# Patient Record
Sex: Female | Born: 1985 | Race: Black or African American | Hispanic: No | Marital: Married | Smoking: Never smoker
Health system: Southern US, Community
[De-identification: ages and names within clinical notes are randomized; demographics above are authoritative.]

## PROBLEM LIST (undated history)

## (undated) ENCOUNTER — Inpatient Hospital Stay (HOSPITAL_COMMUNITY): Payer: Self-pay

## (undated) DIAGNOSIS — I10 Essential (primary) hypertension: Secondary | ICD-10-CM

## (undated) DIAGNOSIS — A749 Chlamydial infection, unspecified: Secondary | ICD-10-CM

## (undated) DIAGNOSIS — J45909 Unspecified asthma, uncomplicated: Secondary | ICD-10-CM

## (undated) DIAGNOSIS — Z9109 Other allergy status, other than to drugs and biological substances: Secondary | ICD-10-CM

## (undated) HISTORY — DX: Unspecified asthma, uncomplicated: J45.909

## (undated) HISTORY — PX: NO PAST SURGERIES: SHX2092

---
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
## 2005-02-01 ENCOUNTER — Inpatient Hospital Stay (HOSPITAL_COMMUNITY): Admission: AD | Admit: 2005-02-01 | Discharge: 2005-02-01 | Payer: Self-pay | Admitting: Obstetrics

## 2005-03-19 ENCOUNTER — Ambulatory Visit: Payer: Self-pay | Admitting: Internal Medicine

## 2005-11-06 ENCOUNTER — Emergency Department (HOSPITAL_COMMUNITY): Admission: EM | Admit: 2005-11-06 | Discharge: 2005-11-06 | Payer: Self-pay | Admitting: Emergency Medicine

## 2006-08-05 ENCOUNTER — Emergency Department (HOSPITAL_COMMUNITY): Admission: EM | Admit: 2006-08-05 | Discharge: 2006-08-05 | Payer: Self-pay | Admitting: Emergency Medicine

## 2006-09-28 ENCOUNTER — Emergency Department (HOSPITAL_COMMUNITY): Admission: EM | Admit: 2006-09-28 | Discharge: 2006-09-28 | Payer: Self-pay | Admitting: Family Medicine

## 2006-11-15 ENCOUNTER — Inpatient Hospital Stay (HOSPITAL_COMMUNITY): Admission: AD | Admit: 2006-11-15 | Discharge: 2006-11-15 | Payer: Self-pay | Admitting: Obstetrics

## 2006-12-17 ENCOUNTER — Inpatient Hospital Stay (HOSPITAL_COMMUNITY): Admission: AD | Admit: 2006-12-17 | Discharge: 2006-12-17 | Payer: Self-pay | Admitting: Obstetrics

## 2006-12-25 ENCOUNTER — Ambulatory Visit (HOSPITAL_COMMUNITY): Admission: RE | Admit: 2006-12-25 | Discharge: 2006-12-25 | Payer: Self-pay | Admitting: Obstetrics

## 2007-01-01 ENCOUNTER — Ambulatory Visit (HOSPITAL_COMMUNITY): Admission: RE | Admit: 2007-01-01 | Discharge: 2007-01-01 | Payer: Self-pay | Admitting: Obstetrics

## 2007-03-12 ENCOUNTER — Inpatient Hospital Stay (HOSPITAL_COMMUNITY): Admission: AD | Admit: 2007-03-12 | Discharge: 2007-03-12 | Payer: Self-pay | Admitting: Obstetrics

## 2007-04-27 ENCOUNTER — Inpatient Hospital Stay (HOSPITAL_COMMUNITY): Admission: AD | Admit: 2007-04-27 | Discharge: 2007-04-28 | Payer: Self-pay | Admitting: Obstetrics & Gynecology

## 2007-05-09 ENCOUNTER — Inpatient Hospital Stay (HOSPITAL_COMMUNITY): Admission: AD | Admit: 2007-05-09 | Discharge: 2007-05-09 | Payer: Self-pay | Admitting: Obstetrics

## 2007-05-16 ENCOUNTER — Inpatient Hospital Stay (HOSPITAL_COMMUNITY): Admission: AD | Admit: 2007-05-16 | Discharge: 2007-05-19 | Payer: Self-pay | Admitting: Obstetrics

## 2007-08-23 ENCOUNTER — Emergency Department (HOSPITAL_COMMUNITY): Admission: EM | Admit: 2007-08-23 | Discharge: 2007-08-23 | Payer: Self-pay | Admitting: Family Medicine

## 2007-09-26 ENCOUNTER — Emergency Department (HOSPITAL_COMMUNITY): Admission: EM | Admit: 2007-09-26 | Discharge: 2007-09-26 | Payer: Self-pay | Admitting: Family Medicine

## 2008-07-17 ENCOUNTER — Emergency Department (HOSPITAL_COMMUNITY): Admission: EM | Admit: 2008-07-17 | Discharge: 2008-07-17 | Payer: Self-pay | Admitting: Family Medicine

## 2008-12-08 ENCOUNTER — Emergency Department (HOSPITAL_COMMUNITY): Admission: EM | Admit: 2008-12-08 | Discharge: 2008-12-08 | Payer: Self-pay | Admitting: Emergency Medicine

## 2009-01-24 ENCOUNTER — Emergency Department (HOSPITAL_COMMUNITY): Admission: EM | Admit: 2009-01-24 | Discharge: 2009-01-24 | Payer: Self-pay | Admitting: Family Medicine

## 2009-12-26 ENCOUNTER — Emergency Department (HOSPITAL_COMMUNITY): Admission: EM | Admit: 2009-12-26 | Discharge: 2009-12-26 | Payer: Self-pay | Admitting: Family Medicine

## 2010-02-27 ENCOUNTER — Emergency Department (HOSPITAL_COMMUNITY): Admission: EM | Admit: 2010-02-27 | Discharge: 2010-02-27 | Payer: Self-pay | Admitting: Family Medicine

## 2010-03-27 ENCOUNTER — Emergency Department (HOSPITAL_COMMUNITY): Admission: EM | Admit: 2010-03-27 | Discharge: 2010-03-27 | Payer: Self-pay | Admitting: Family Medicine

## 2010-07-03 ENCOUNTER — Emergency Department (HOSPITAL_COMMUNITY): Admission: EM | Admit: 2010-07-03 | Discharge: 2010-07-03 | Payer: Self-pay | Admitting: Family Medicine

## 2010-07-03 ENCOUNTER — Emergency Department (HOSPITAL_COMMUNITY): Admission: EM | Admit: 2010-07-03 | Discharge: 2010-07-04 | Payer: Self-pay | Admitting: Emergency Medicine

## 2010-11-05 ENCOUNTER — Emergency Department (HOSPITAL_COMMUNITY)
Admission: EM | Admit: 2010-11-05 | Discharge: 2010-11-05 | Payer: Self-pay | Source: Home / Self Care | Admitting: Emergency Medicine

## 2011-01-13 LAB — POCT PREGNANCY, URINE
Preg Test, Ur: NEGATIVE
Preg Test, Ur: NEGATIVE

## 2011-01-20 LAB — POCT RAPID STREP A (OFFICE): Streptococcus, Group A Screen (Direct): NEGATIVE

## 2011-01-22 LAB — POCT RAPID STREP A (OFFICE): Streptococcus, Group A Screen (Direct): POSITIVE — AB

## 2011-02-03 ENCOUNTER — Emergency Department (HOSPITAL_COMMUNITY)
Admission: EM | Admit: 2011-02-03 | Discharge: 2011-02-04 | Discharge status: Against Medical Advice | Payer: Self-pay | Attending: Emergency Medicine | Admitting: Emergency Medicine

## 2011-02-03 DIAGNOSIS — M549 Dorsalgia, unspecified: Secondary | ICD-10-CM | POA: Insufficient documentation

## 2011-02-03 DIAGNOSIS — M79609 Pain in unspecified limb: Secondary | ICD-10-CM | POA: Insufficient documentation

## 2011-02-03 DIAGNOSIS — R079 Chest pain, unspecified: Secondary | ICD-10-CM | POA: Insufficient documentation

## 2011-02-13 LAB — POCT URINALYSIS DIP (DEVICE)
Bilirubin Urine: NEGATIVE
Glucose, UA: NEGATIVE mg/dL
Ketones, ur: NEGATIVE mg/dL
Nitrite: POSITIVE — AB
Protein, ur: >=300 mg/dL — AB
Specific Gravity, Urine: 1.025 (ref 1.005–1.030)
Urobilinogen, UA: 2 mg/dL — ABNORMAL HIGH (ref 0.0–1.0)
pH: 6.5 (ref 5.0–8.0)

## 2011-02-13 LAB — POCT PREGNANCY, URINE: Preg Test, Ur: NEGATIVE

## 2011-02-16 ENCOUNTER — Emergency Department (HOSPITAL_COMMUNITY)
Admission: EM | Admit: 2011-02-16 | Discharge: 2011-02-16 | Disposition: A | Discharge status: Home / Self Care | Payer: Self-pay | Attending: Emergency Medicine | Admitting: Emergency Medicine

## 2011-02-16 ENCOUNTER — Emergency Department (HOSPITAL_COMMUNITY): Payer: Self-pay

## 2011-02-16 DIAGNOSIS — R109 Unspecified abdominal pain: Secondary | ICD-10-CM | POA: Insufficient documentation

## 2011-02-16 DIAGNOSIS — R10811 Right upper quadrant abdominal tenderness: Secondary | ICD-10-CM | POA: Insufficient documentation

## 2011-02-16 LAB — LIPASE, BLOOD: Lipase: 24 U/L (ref 11–59)

## 2011-02-16 LAB — URINALYSIS, ROUTINE W REFLEX MICROSCOPIC
Bilirubin Urine: NEGATIVE
Glucose, UA: NEGATIVE mg/dL
Hgb urine dipstick: NEGATIVE
Ketones, ur: NEGATIVE mg/dL
Nitrite: NEGATIVE
Protein, ur: NEGATIVE mg/dL
Specific Gravity, Urine: 1.026 (ref 1.005–1.030)
Urobilinogen, UA: 1 mg/dL (ref 0.0–1.0)
pH: 7 (ref 5.0–8.0)

## 2011-02-16 LAB — COMPREHENSIVE METABOLIC PANEL
ALT: 24 U/L (ref 0–35)
AST: 20 U/L (ref 0–37)
Albumin: 3.3 g/dL — ABNORMAL LOW (ref 3.5–5.2)
Alkaline Phosphatase: 49 U/L (ref 39–117)
BUN: 7 mg/dL (ref 6–23)
CO2: 28 mEq/L (ref 19–32)
Calcium: 9.1 mg/dL (ref 8.4–10.5)
Chloride: 108 mEq/L (ref 96–112)
Creatinine, Ser: 0.92 mg/dL (ref 0.4–1.2)
GFR calc Af Amer: >60 mL/min (ref >60)
GFR calc non Af Amer: >60 mL/min (ref >60)
Glucose, Bld: 76 mg/dL (ref 70–99)
Potassium: 3.9 mEq/L (ref 3.5–5.1)
Sodium: 140 mEq/L (ref 135–145)
Total Bilirubin: 0.3 mg/dL (ref 0.3–1.2)
Total Protein: 6.4 g/dL (ref 6.0–8.3)

## 2011-02-16 LAB — CBC
HCT: 40.1 % (ref 36.0–46.0)
Hemoglobin: 13.4 g/dL (ref 12.0–15.0)
MCH: 29.2 pg (ref 26.0–34.0)
MCHC: 33.4 g/dL (ref 30.0–36.0)
MCV: 87.4 fL (ref 78.0–100.0)
Platelets: 297 10*3/uL (ref 150–400)
RBC: 4.59 MIL/uL (ref 3.87–5.11)
RDW: 12.7 % (ref 11.5–15.5)
WBC: 9.5 10*3/uL (ref 4.0–10.5)

## 2011-02-16 LAB — DIFFERENTIAL
Basophils Absolute: 0.1 10*3/uL (ref 0.0–0.1)
Basophils Relative: 1 % (ref 0–1)
Eosinophils Absolute: 0.4 10*3/uL (ref 0.0–0.7)
Eosinophils Relative: 5 % (ref 0–5)
Lymphocytes Relative: 40 % (ref 12–46)
Lymphs Abs: 3.8 10*3/uL (ref 0.7–4.0)
Monocytes Absolute: 1.2 10*3/uL — ABNORMAL HIGH (ref 0.1–1.0)
Monocytes Relative: 13 % — ABNORMAL HIGH (ref 3–12)
Neutro Abs: 4 10*3/uL (ref 1.7–7.7)
Neutrophils Relative %: 43 % (ref 43–77)

## 2011-02-16 LAB — POCT PREGNANCY, URINE: Preg Test, Ur: NEGATIVE

## 2011-02-18 ENCOUNTER — Emergency Department (HOSPITAL_COMMUNITY)
Admission: EM | Admit: 2011-02-18 | Discharge: 2011-02-18 | Discharge status: Against Medical Advice | Payer: Self-pay | Attending: Emergency Medicine | Admitting: Emergency Medicine

## 2011-02-18 DIAGNOSIS — R109 Unspecified abdominal pain: Secondary | ICD-10-CM | POA: Insufficient documentation

## 2011-03-21 NOTE — Discharge Summary (Signed)
Amanda Melton, FAIT                             ACCOUNT NO.:  [id number]   MEDICAL RECORD NO.:  [id number]                   PATIENT TYPE:  INP   LOCATION:  9304                                 FACILITY:  [facility]   PHYSICIAN:  Roseanna Rainbow, M.D.         DATE OF BIRTH:  June 28, 1986   DATE OF ADMISSION:  06/21/2004  DATE OF DISCHARGE:  06/25/2004                                 DISCHARGE SUMMARY   CHIEF COMPLAINT:  The patient is an 25 year old gravida 0 African-American  female, last menstrual period in 2003 on Depo-Provera, who presented  complaining of pelvic and lower back pain.   HISTORY OF PRESENT ILLNESS:  See above.  She had presented to the office  with these complaints.  GC and chlamydia probes were positive.  She had  failed an outpatient regimen for pelvic inflammatory disease including  doxycycline and Flagyl.   PAST SURGICAL HISTORY:  She denies.   PAST MEDICAL HISTORY:  She denies.   MEDICATIONS:  Doxycycline and Flagyl.   ALLERGIES:  No known drug allergies.   SOCIAL HISTORY:  No tobacco, ethanol, or substance abuse.   PHYSICAL EXAMINATION:  VITAL SIGNS:  Temperature 98.2, pulse 64, respiratory  rate 18, blood pressure 117/67.  LUNGS:  Clear to auscultation bilaterally.  HEART:  Regular rate and rhythm.  ABDOMEN:  Nongravid, tender.  PELVIC:  The uterus mid position, tender.  Adnexa tender bilaterally.   ASSESSMENT:  Pelvic inflammatory disease, failed outpatient regimen.   PLAN:  Admission, parenteral antibiotics.   HOSPITAL COURSE:  The patient was admitted and was started on Cefotan and  doxycycline.  She remained afebrile throughout her hospital course.  A  pelvic ultrasound failed to demonstrate any pelvic collections or masses.  Her symptoms improved.  On hospital day #5 her exam was nontender and she  was discharged to home.   DISCHARGE DIAGNOSIS:  Pelvic inflammatory disease.   CONDITION:  Stable.   DIET:  Regular.   MEDICATIONS:   Doxycycline, Flagyl, ibuprofen.   ACTIVITY:  No intercourse.   DISPOSITION:  The patient was to follow up in the office in 2 weeks.      LAJ/MEDQ  D:  07/23/2004  T:  07/23/2004  Job:  [id number]

## 2011-08-06 LAB — POCT RAPID STREP A: Streptococcus, Group A Screen (Direct): NEGATIVE

## 2011-08-12 LAB — POCT RAPID STREP A: Streptococcus, Group A Screen (Direct): NEGATIVE

## 2011-08-18 LAB — CBC
HCT: 30.6 — ABNORMAL LOW
Hemoglobin: 10.2 — ABNORMAL LOW
MCHC: 33.5
MCV: 87.7
RDW: 13.3

## 2011-08-19 LAB — CBC
MCHC: 33.7
MCV: 87.5
Platelets: 242
RDW: 12.9
WBC: 9.9

## 2011-08-19 LAB — COMPREHENSIVE METABOLIC PANEL
AST: 17
Albumin: 2.1 — ABNORMAL LOW
Chloride: 107
Creatinine, Ser: 0.52
GFR calc Af Amer: >60
Potassium: 3.2 — ABNORMAL LOW
Total Bilirubin: 0.7

## 2011-08-19 LAB — SAMPLE TO BLOOD BANK

## 2011-08-19 LAB — RPR: RPR Ser Ql: NONREACTIVE

## 2011-08-19 LAB — URIC ACID: Uric Acid, Serum: 3.3

## 2011-09-29 ENCOUNTER — Encounter: Payer: Self-pay | Admitting: *Deleted

## 2011-09-29 ENCOUNTER — Emergency Department (INDEPENDENT_AMBULATORY_CARE_PROVIDER_SITE_OTHER): Payer: Self-pay

## 2011-09-29 ENCOUNTER — Emergency Department (INDEPENDENT_AMBULATORY_CARE_PROVIDER_SITE_OTHER)
Admission: EM | Admit: 2011-09-29 | Discharge: 2011-09-29 | Disposition: A | Discharge status: Home / Self Care | Payer: Self-pay | Source: Home / Self Care | Attending: Emergency Medicine | Admitting: Emergency Medicine

## 2011-09-29 DIAGNOSIS — S239XXA Sprain of unspecified parts of thorax, initial encounter: Secondary | ICD-10-CM

## 2011-09-29 DIAGNOSIS — IMO0002 Reserved for concepts with insufficient information to code with codable children: Secondary | ICD-10-CM

## 2011-09-29 LAB — POCT PREGNANCY, URINE: Preg Test, Ur: NEGATIVE

## 2011-09-29 LAB — POCT URINALYSIS DIP (DEVICE)
Bilirubin Urine: NEGATIVE
Glucose, UA: NEGATIVE mg/dL
Hgb urine dipstick: NEGATIVE
Leukocytes, UA: NEGATIVE
Nitrite: NEGATIVE

## 2011-09-29 MED ORDER — HYDROCODONE-ACETAMINOPHEN 5-325 MG PO TABS: ORAL_TABLET | ORAL | Status: AC

## 2011-09-29 MED ORDER — CYCLOBENZAPRINE HCL 5 MG PO TABS
5.0000 mg | ORAL_TABLET | Freq: Three times a day (TID) | ORAL | Status: AC | PRN
Start: 2011-09-29 — End: 2011-10-09

## 2011-09-29 MED ORDER — DICLOFENAC SODIUM 75 MG PO TBEC: 75.0000 mg | DELAYED_RELEASE_TABLET | Freq: Two times a day (BID) | ORAL | Status: DC

## 2011-09-29 NOTE — ED Provider Notes (Signed)
History     CSN: [id number] Arrival date & time: 09/29/2011  4:58 PM   First MD Initiated Contact with Patient 09/29/11 1550      Chief Complaint  Patient presents with  . Back Pain    (Consider location/radiation/quality/duration/timing/severity/associated sxs/prior treatment) HPI Comments: Amanda Melton has had a three-day history of midline upper back pain, localized between the shoulder blades. She denies any injury and she was just sitting and watching television the pain came on. The pain does not radiate up or down or into her arms or legs and she has no numbness or tingling in the arms or legs. The pain is rated as a 10 over 10 in intensity. It is constant and is not related to lifting, twisting, bending, position, or deep inspiration. He she denies any anterior chest pain or shortness of breath. She's had no abdominal pain, nausea, or vomiting. No fever, chills, or urinary symptoms. She tried Aleve, aspirin, ibuprofen, and Tylenol all without relief.   History reviewed. No pertinent past medical history.  History reviewed. No pertinent past surgical history.  History reviewed. No pertinent family history.  History  Substance Use Topics  . Smoking status: Never Smoker   . Smokeless tobacco: Not on file  . Alcohol Use: Yes     occasionally    OB History    Grav Para Term Preterm Abortions TAB SAB Ect Mult Living                  Review of Systems  Constitutional: Negative for fever, chills and unexpected weight change.  Gastrointestinal: Negative for abdominal pain.  Genitourinary: Negative for dysuria, urgency, frequency and difficulty urinating.  Musculoskeletal: Positive for back pain. Negative for myalgias, joint swelling, arthralgias and gait problem.  Neurological: Negative for weakness and numbness.    Allergies  Review of patient's allergies indicates no known allergies.  Home Medications   Current Outpatient Rx  Name Route Sig Dispense Refill  .  MEDROXYPROGESTERONE ACETATE 150 MG/ML IM SUSP Intramuscular Inject 150 mg into the muscle every 3 (three) months.      . CYCLOBENZAPRINE HCL 5 MG PO TABS Oral Take 1 tablet (5 mg total) by mouth 3 (three) times daily as needed for muscle spasms. 30 tablet 0  . DICLOFENAC SODIUM 75 MG PO TBEC Oral Take 1 tablet (75 mg total) by mouth 2 (two) times daily. 20 tablet 0  . HYDROCODONE-ACETAMINOPHEN 5-325 MG PO TABS  1 to 2 tabs every 4 to 6 hours as needed for pain. 20 tablet 0    BP 144/77  Pulse 64  Temp(Src) 98.8 F (37.1 C) (Oral)  Resp 16  SpO2 100%  LMP 12/17/2010  Physical Exam  Nursing note and vitals reviewed. Constitutional: She is oriented to person, place, and time. She appears well-developed and well-nourished.  Cardiovascular: Normal rate, regular rhythm and normal heart sounds.   Pulmonary/Chest: Effort normal and breath sounds normal.  Abdominal: Soft. Bowel sounds are normal. She exhibits no distension, no abdominal bruit, no pulsatile midline mass and no mass. There is no tenderness. There is no rebound and no guarding.  Musculoskeletal: She exhibits tenderness. She exhibits no edema.       Lumbar back: She exhibits decreased range of motion, tenderness, bony tenderness and pain. She exhibits no swelling, no edema, no deformity, no spasm and normal pulse.       Exam of the upper back reveals tenderness to palpation between the shoulder blades extending out just below the  shoulder blade areas bilaterally. The back has very limited range of motion with pain in all directions. Straight leg raising was negative. Her neck had a full range of motion with no pain and there was no muscular neck pain to palpation and shoulders have a full range of motion.  Neurological: She is alert and oriented to person, place, and time. She has normal reflexes. She displays no atrophy and normal reflexes. No sensory deficit. She exhibits normal muscle tone. Coordination and gait normal.  Skin: Skin is  warm and dry. No rash noted. She is not diaphoretic.    ED Course  Procedures (including critical care time)  Results for orders placed during the hospital encounter of 09/29/11  POCT URINALYSIS DIP (DEVICE)      Component Value Range   Glucose, UA NEGATIVE  NEGATIVE (mg/dL)   Bilirubin Urine NEGATIVE  NEGATIVE    Ketones, ur NEGATIVE  NEGATIVE (mg/dL)   Specific Gravity, Urine 1.020  1.005 - 1.030    Hgb urine dipstick NEGATIVE  NEGATIVE    pH 7.0  5.0 - 8.0    Protein, ur NEGATIVE  NEGATIVE (mg/dL)   Urobilinogen, UA 0.2  0.0 - 1.0 (mg/dL)   Nitrite NEGATIVE  NEGATIVE    Leukocytes, UA NEGATIVE  NEGATIVE   POCT PREGNANCY, URINE      Component Value Range   Preg Test, Ur NEGATIVE         Labs Reviewed  POCT URINALYSIS DIP (DEVICE)  POCT PREGNANCY, URINE  POCT URINALYSIS DIPSTICK  POCT PREGNANCY, URINE   Dg Chest 2 View  09/29/2011  *RADIOLOGY REPORT*  Clinical Data:  Back pain.  CHEST - 2 VIEW  Comparison: None  Findings: The heart size and mediastinal contours are within normal limits.  Both lungs are clear.  The visualized skeletal structures are unremarkable.  IMPRESSION: No active disease.  Original Report Authenticated By: Reola Calkins, M.D.     1. Thoracic sprain and strain       MDM  Her chest x-ray and urinalysis were normal. Apparently she has muscular upper back pain and she was begun on back exercises, anti-inflammatories, muscle relaxers, and pain medication. Suggest she return in 2 weeks if no improvement.        Roque Lias, MD 09/29/11 [phone]

## 2011-09-29 NOTE — ED Notes (Signed)
Pt is here with complaints of middle back pain with onset Saturday.  Pt denies any known injury or history of back pain.  Reports pain across back in bra strap area as aching with increased pain with movement and lying flat.

## 2011-11-04 NOTE — L&D Delivery Note (Signed)
Delivery Note At 12:04 PM a viable female was delivered via  (Presentation: LOP ).  APGAR: , ; weight 6 lb 3.2 oz (2812 g).   Placenta status: intact .  Cord:  with the following complications:none .  Cord pH: sent.  Atony with response to fundal massage. Hemabate x 2  Anesthesia: Epidural  Episiotomy: none Lacerations: labial Suture Repair: 3.0 vicryl rapide Est. Blood Loss (mL): 1000 ml  Mom to postpartum.  Baby to nursery-stable.  JACKSON-MOORE,Jerin Franzel A 07/23/2012, 12:53 PM

## 2011-12-22 ENCOUNTER — Inpatient Hospital Stay (HOSPITAL_COMMUNITY)
Admission: AD | Admit: 2011-12-22 | Discharge: 2011-12-22 | Disposition: A | Discharge status: Home / Self Care | Payer: Medicaid Other | Source: Ambulatory Visit | Attending: Family Medicine | Admitting: Family Medicine

## 2011-12-22 ENCOUNTER — Encounter (HOSPITAL_COMMUNITY): Payer: Self-pay | Admitting: *Deleted

## 2011-12-22 DIAGNOSIS — O210 Mild hyperemesis gravidarum: Secondary | ICD-10-CM | POA: Insufficient documentation

## 2011-12-22 DIAGNOSIS — O219 Vomiting of pregnancy, unspecified: Secondary | ICD-10-CM

## 2011-12-22 LAB — URINALYSIS, ROUTINE W REFLEX MICROSCOPIC
Glucose, UA: NEGATIVE mg/dL
Leukocytes, UA: NEGATIVE
Nitrite: NEGATIVE
Protein, ur: NEGATIVE mg/dL
Urobilinogen, UA: 0.2 mg/dL (ref 0.0–1.0)

## 2011-12-22 LAB — POCT PREGNANCY, URINE: Preg Test, Ur: POSITIVE — AB

## 2011-12-22 MED ORDER — ONDANSETRON HCL 8 MG PO TABS: 8.0000 mg | ORAL_TABLET | Freq: Three times a day (TID) | ORAL | Status: AC | PRN

## 2011-12-22 MED ORDER — ONDANSETRON 8 MG PO TBDP
8.0000 mg | ORAL_TABLET | Freq: Once | ORAL | Status: AC
  Administered 2011-12-22: 8 mg via ORAL
  Filled 2011-12-22: qty 1

## 2011-12-22 MED ORDER — PROMETHAZINE HCL 25 MG PO TABS
25.0000 mg | ORAL_TABLET | Freq: Once | ORAL | Status: AC
  Administered 2011-12-22: 25 mg via ORAL
  Filled 2011-12-22: qty 1

## 2011-12-22 MED ORDER — PROMETHAZINE HCL 12.5 MG PO TABS: 25.0000 mg | ORAL_TABLET | Freq: Four times a day (QID) | ORAL | Status: DC | PRN

## 2011-12-22 NOTE — Progress Notes (Signed)
Can't keep anything on stomach, every time she eats it come right back.  Found out preg 2-3wks ago, unsure of how far along she is.

## 2011-12-22 NOTE — ED Provider Notes (Signed)
History   Amanda Melton is a 26 y.o. year old G63P1011 female at Unknown weeks gestation who presents to MAU reporting N/V, being unable to keep anything down x 2 days and pos UPT 2-3 weeks ago.   CSN: [id number]  Arrival date & time 12/22/11  0934   None     Chief Complaint  Patient presents with  . Morning Sickness    (Consider location/radiation/quality/duration/timing/severity/associated sxs/prior treatment) HPI  Past Medical History  Diagnosis Date  . No pertinent past medical history     Past Surgical History  Procedure Date  . No past surgeries     Family History  Problem Relation Age of Onset  . Diabetes Father   . Hypertension Father     History  Substance Use Topics  . Smoking status: Never Smoker   . Smokeless tobacco: Not on file  . Alcohol Use: Yes     occasionally, not with preg    OB History    Grav Para Term Preterm Abortions TAB SAB Ect Mult Living   3 1 1  0 1 0 1 0 0 1      Review of Systems  Constitutional: Negative for fever and chills.  Gastrointestinal: Positive for nausea and vomiting. Negative for abdominal pain.  Genitourinary: Negative for dysuria and vaginal bleeding.    Allergies  Review of patient's allergies indicates no known allergies.  Home Medications  No current outpatient prescriptions on file.  BP 128/66  Pulse 74  Temp(Src) 98.1 F (36.7 C) (Oral)  Resp 20  Ht 5' 5.5" (1.664 m)  Wt 110.224 kg (243 lb)  BMI 39.82 kg/m2  LMP 11/06/2011  Physical Exam  Constitutional: She is oriented to person, place, and time. She appears well-developed and well-nourished. No distress.  HENT:  Mouth/Throat: Mucous membranes are not dry.  Cardiovascular: Normal rate.   Pulmonary/Chest: Effort normal.  Abdominal: Soft. There is no tenderness.  Genitourinary: Vagina normal and uterus normal.  Neurological: She is alert and oriented to person, place, and time.  Skin: Skin is warm and dry.  Psychiatric: She has a normal mood  and affect.    ED Course  Procedures (including critical care time)  Labs Reviewed  POCT PREGNANCY, URINE - Abnormal; Notable for the following:    Preg Test, Ur POSITIVE (*)    All other components within normal limits  URINALYSIS, ROUTINE W REFLEX MICROSCOPIC  LAB REPORT - SCANNED   tolerating solids after Zofran and Phenergan PO  1. Vomiting or nausea of pregnancy without dehydration   MDM  D/C home Rx Phenergan and Zofran Start [facility] Pregnancy verification letter given  Dorathy Kinsman 12/22/2011

## 2011-12-22 NOTE — Progress Notes (Signed)
Pt in c/o nausea and vomiting since Friday night.  Unable to keep anything down.  Reports abdominal pain after vomiting.  Denies any bleeding. LMP 11/06/11.

## 2011-12-22 NOTE — Discharge Instructions (Signed)
Prenatal Care Providers °[facility]    [facility] ° Phone- [phone]     Phone: [phone] °         °[facility]                      [facility] ° @[place]     Phone: [phone] ° Phone: [phone] °        [facility] °[facility]     Phone: [phone] ° Phone: [phone]   °        [facility] °[facility]                 hone: [phone] ° Phone: [phone] °        [facility] °Dr. Bernard Marshall      Phone: [phone] ° Phone: [phone] °        [facility] °[facility]                Phone: [phone] ° [facility]  ° Phone:[phone]    [facility] ([place]) °         Phone: [phone] °[facility] °  Phone: [phone] °

## 2012-01-01 NOTE — ED Provider Notes (Signed)
Chart reviewed and agree with management and plan.  

## 2012-01-27 LAB — OB RESULTS CONSOLE ABO/RH: RH Type: POSITIVE

## 2012-01-27 LAB — OB RESULTS CONSOLE ANTIBODY SCREEN: Antibody Screen: NEGATIVE

## 2012-01-27 LAB — OB RESULTS CONSOLE HEPATITIS B SURFACE ANTIGEN: Hepatitis B Surface Ag: NEGATIVE

## 2012-01-31 ENCOUNTER — Inpatient Hospital Stay (HOSPITAL_COMMUNITY)
Admission: AD | Admit: 2012-01-31 | Discharge: 2012-02-01 | Disposition: A | Discharge status: Home / Self Care | Payer: Medicaid Other | Source: Ambulatory Visit | Attending: Obstetrics | Admitting: Obstetrics

## 2012-01-31 DIAGNOSIS — O210 Mild hyperemesis gravidarum: Secondary | ICD-10-CM | POA: Insufficient documentation

## 2012-01-31 DIAGNOSIS — K5289 Other specified noninfective gastroenteritis and colitis: Secondary | ICD-10-CM | POA: Insufficient documentation

## 2012-01-31 DIAGNOSIS — R197 Diarrhea, unspecified: Secondary | ICD-10-CM | POA: Insufficient documentation

## 2012-01-31 DIAGNOSIS — O99891 Other specified diseases and conditions complicating pregnancy: Secondary | ICD-10-CM | POA: Insufficient documentation

## 2012-01-31 DIAGNOSIS — K529 Noninfective gastroenteritis and colitis, unspecified: Secondary | ICD-10-CM

## 2012-01-31 LAB — URINALYSIS, ROUTINE W REFLEX MICROSCOPIC
Ketones, ur: NEGATIVE mg/dL
Nitrite: NEGATIVE
Protein, ur: 30 mg/dL — AB
Urobilinogen, UA: 2 mg/dL — ABNORMAL HIGH (ref 0.0–1.0)

## 2012-01-31 NOTE — MAU Note (Signed)
Pt states," I woke up this morning at 5am an threw up and then diarrhea started. I've thrown up five times and had diarrhea numerous times.I was cold and my temp was 101.5 at 9:30 pm . I haven't been able to eat all day. I have sharp pains in my low abdomen."

## 2012-02-01 ENCOUNTER — Encounter (HOSPITAL_COMMUNITY): Payer: Self-pay | Admitting: *Deleted

## 2012-02-01 LAB — CBC
Hemoglobin: 12.1 g/dL (ref 12.0–15.0)
MCH: 30.3 pg (ref 26.0–34.0)
MCHC: 34.5 g/dL (ref 30.0–36.0)
Platelets: 226 10*3/uL (ref 150–400)
RBC: 4 MIL/uL (ref 3.87–5.11)

## 2012-02-01 LAB — DIFFERENTIAL
Basophils Relative: 0 % (ref 0–1)
Eosinophils Absolute: 0.1 10*3/uL (ref 0.0–0.7)
Lymphs Abs: 0.9 10*3/uL (ref 0.7–4.0)
Neutro Abs: 5.9 10*3/uL (ref 1.7–7.7)
Neutrophils Relative %: 75 % (ref 43–77)

## 2012-02-01 MED ORDER — OXYCODONE-ACETAMINOPHEN 5-325 MG PO TABS
2.0000 | ORAL_TABLET | Freq: Once | ORAL | Status: AC
  Administered 2012-02-01: 2 via ORAL
  Filled 2012-02-01: qty 2

## 2012-02-01 MED ORDER — ONDANSETRON HCL 4 MG/2ML IJ SOLN
4.0000 mg | Freq: Once | INTRAMUSCULAR | Status: AC
  Administered 2012-02-01: 4 mg via INTRAVENOUS
  Filled 2012-02-01: qty 2

## 2012-02-01 MED ORDER — GI COCKTAIL ~~LOC~~
30.0000 mL | Freq: Once | ORAL | Status: AC
  Administered 2012-02-01: 30 mL via ORAL
  Filled 2012-02-01: qty 30

## 2012-02-01 MED ORDER — LACTATED RINGERS IV BOLUS (SEPSIS)
1000.0000 mL | Freq: Once | INTRAVENOUS | Status: AC
  Administered 2012-02-01: 1000 mL via INTRAVENOUS

## 2012-02-01 MED ORDER — PROMETHAZINE HCL 25 MG RE SUPP: 25.0000 mg | Freq: Four times a day (QID) | RECTAL | Status: DC | PRN

## 2012-02-01 NOTE — MAU Provider Note (Signed)
History   Amanda Melton is a 26 y.o. year old G80P1011 female at [redacted]w[redacted]d weeks gestation who presents to MAU reporting nausea, vomiting, diarrhea and fever 100.5 x 1 day. She denies sick contacts. She had N/V of pregnancy earlier in the pregnancy, but denies any x several weeks.  First Provider Initiated Contact with Patient 02/01/12 0157     CSN: [id number]  Arrival date & time 01/31/12  2259   None     Chief Complaint  Patient presents with  . Emesis  . Diarrhea  . Fever    (Consider location/radiation/quality/duration/timing/severity/associated sxs/prior treatment) HPI  Past Medical History  Diagnosis Date  . No pertinent past medical history     Past Surgical History  Procedure Date  . No past surgeries     Family History  Problem Relation Age of Onset  . Diabetes Father   . Hypertension Father   . Anesthesia problems Neg Hx     History  Substance Use Topics  . Smoking status: Never Smoker   . Smokeless tobacco: Not on file  . Alcohol Use: Yes     occasionally, not with preg    OB History    Grav Para Term Preterm Abortions TAB SAB Ect Mult Living   4 1 1  0 1 0 1 0 0 1     Review of Systems  Constitutional: Positive for fever. Negative for chills.  Gastrointestinal: Positive for nausea, vomiting, abdominal pain (mild generalized colicy pain) and diarrhea.  Genitourinary: Negative for dysuria, flank pain, vaginal bleeding and vaginal discharge.  Musculoskeletal: Negative for myalgias.   Allergies  Review of patient's allergies indicates no known allergies.  Home Medications  No current outpatient prescriptions on file.  BP 126/69  Pulse 85  Temp(Src) 98.1 F (36.7 C) (Oral)  Resp 16  LMP 11/06/2011  Physical Exam  Nursing note and vitals reviewed. Constitutional: She is oriented to person, place, and time. She appears well-developed and well-nourished. No distress.  Cardiovascular: Normal rate.   Pulmonary/Chest: Effort normal.  Abdominal:  Soft. There is no tenderness.  Neurological: She is alert and oriented to person, place, and time.  Skin: Skin is warm and dry.  Psychiatric: She has a normal mood and affect.   FHR 159 by doppler ED Course  Procedures (including critical care time)  Labs Reviewed  URINALYSIS, ROUTINE W REFLEX MICROSCOPIC - Abnormal; Notable for the following:    Specific Gravity, Urine >1.030 (*)    Hgb urine dipstick TRACE (*)    Bilirubin Urine SMALL (*)    Protein, ur 30 (*)    Urobilinogen, UA 2.0 (*)    All other components within normal limits  URINE MICROSCOPIC-ADD ON - Abnormal; Notable for the following:    Squamous Epithelial / LPF FEW (*)    All other components within normal limits   N/V resolved w/ Zofran Feeing better after IV fluids, GI cocktail and Percocet. No Diarrhea while in MAU.  1. Gastroenteritis    MDM  D/C home  F/U as scheduled w/ Dr. Clearance Coots or MAU PRN for worsening Sx. BRAT diet.  Encouraged frequent hand washing  Dorathy Kinsman 02/01/2012 5:22 AM

## 2012-02-01 NOTE — MAU Note (Signed)
Pt reports n/v/d since today. Also pt reports abdominal pain.

## 2012-02-01 NOTE — Discharge Instructions (Signed)
May take Imodium for diarrhea as needed.   B.R.A.T. Diet Your doctor has recommended the B.R.A.T. diet for you or your child until the condition improves. This is often used to help control diarrhea and vomiting symptoms. If you or your child can tolerate clear liquids, you may have:  Bananas.   Rice.   Applesauce.   Toast (and other simple starches such as crackers, potatoes, noodles).  Be sure to avoid dairy products, meats, and fatty foods until symptoms are better. Fruit juices such as apple, grape, and prune juice can make diarrhea worse. Avoid these. Continue this diet for 2 days or as instructed by your caregiver. Document Released: 10/20/2005 Document Revised: 10/09/2011 Document Reviewed: 04/08/2007 [facility] Patient Information 2012 [place], [place].

## 2012-02-25 ENCOUNTER — Inpatient Hospital Stay (HOSPITAL_COMMUNITY)
Admission: AD | Admit: 2012-02-25 | Discharge: 2012-02-25 | Disposition: A | Discharge status: Home / Self Care | Payer: Medicaid Other | Source: Ambulatory Visit | Attending: Obstetrics & Gynecology | Admitting: Obstetrics & Gynecology

## 2012-02-25 ENCOUNTER — Encounter (HOSPITAL_COMMUNITY): Payer: Self-pay

## 2012-02-25 DIAGNOSIS — O26899 Other specified pregnancy related conditions, unspecified trimester: Secondary | ICD-10-CM

## 2012-02-25 DIAGNOSIS — R109 Unspecified abdominal pain: Secondary | ICD-10-CM | POA: Insufficient documentation

## 2012-02-25 DIAGNOSIS — O99891 Other specified diseases and conditions complicating pregnancy: Secondary | ICD-10-CM | POA: Insufficient documentation

## 2012-02-25 DIAGNOSIS — IMO0002 Reserved for concepts with insufficient information to code with codable children: Secondary | ICD-10-CM | POA: Insufficient documentation

## 2012-02-25 MED ORDER — ACETAMINOPHEN 500 MG PO TABS
1000.0000 mg | ORAL_TABLET | Freq: Once | ORAL | Status: AC
  Administered 2012-02-25: 1000 mg via ORAL
  Filled 2012-02-25: qty 2

## 2012-02-25 NOTE — MAU Note (Signed)
No adverse effect from tylenol, rates pain 6/10

## 2012-02-25 NOTE — Discharge Instructions (Signed)
Take Tylenol 325 mg 2 tablets by mouth every 4 hours if needed for pain. Drink at least 8 8-oz glasses of water every day. Call your doctor if your pain worsens or if you have any vaginal bleeding. Keep your appointments as scheduled.

## 2012-02-25 NOTE — MAU Provider Note (Signed)
  History     CSN: [id number]  Arrival date and time: 02/25/12 3474   First Provider Initiated Contact with Patient 02/25/12 [phone]      Chief Complaint  Patient presents with  . Abdominal Pain   HPI Amanda Melton 26 y.o. [redacted]w[redacted]d  Comes to MAU as she was at work [employer]) and a can which she was taking off a shelf fell and hit on her abdomen.  Having abdominal pain.  Denies any vaginal bleeding or leaking of fluid.  OB History    Grav Para Term Preterm Abortions TAB SAB Ect Mult Living   3 1 1  0 1 0 1 0 0 1      Past Medical History  Diagnosis Date  . No pertinent past medical history     Past Surgical History  Procedure Date  . No past surgeries     Family History  Problem Relation Age of Onset  . Diabetes Father   . Hypertension Father   . Anesthesia problems Neg Hx     History  Substance Use Topics  . Smoking status: Never Smoker   . Smokeless tobacco: Never Used  . Alcohol Use: No     occasionally, not with preg    Allergies: No Known Allergies  Prescriptions prior to admission  Medication Sig Dispense Refill  . cholecalciferol (VITAMIN D) 1000 UNITS tablet Take 1,000 Units by mouth daily.      . ondansetron (ZOFRAN-ODT) 4 MG disintegrating tablet Take 4 mg by mouth every 8 (eight) hours as needed. For nausea      . Prenatal Vit-Fe Fumarate-FA (PRENATAL MULTIVITAMIN) TABS Take 1 tablet by mouth daily.        Review of Systems  Gastrointestinal: Positive for abdominal pain.  Genitourinary: Negative for dysuria.       No vaginal bleeding   Physical Exam   Blood pressure 129/74, pulse 73, temperature 97.8 F (36.6 C), temperature source Oral, resp. rate 20, height 5\' 5"  (1.651 m), weight 245 lb 3.2 oz (111.222 kg), last menstrual period 11/06/2011, SpO2 100.00%.  Physical Exam  Nursing note and vitals reviewed. Constitutional: She is oriented to person, place, and time. She appears well-developed and well-nourished.  HENT:  Head:  Normocephalic.  Eyes: EOM are normal.  Neck: Neck supple.  GI: Soft. There is no tenderness. There is no rebound and no guarding.       No bruising seen on abdomen.  Skin is intact. States can hit above the umbilicus. Fundus is 2 fingers below the umbilicus. Tender in all areas above and below the umbilicus.  Musculoskeletal: Normal range of motion.  Neurological: She is alert and oriented to person, place, and time.  Skin: Skin is warm and dry.  Psychiatric: She has a normal mood and affect.    MAU Course  Procedures  MDM 1027  Reviewed plan of care with Dr. Tamela Oddi 1115  Feeling better after resting in bed.  Assessment and Plan  Abdominal pain in pregnancy   Plan Continue tylenol as needed Call your doctor if your pain worsens or if you have any vaginal bleeding. Keep your appointments as scheduled.  Amanda Melton 02/25/2012, 10:28 AM

## 2012-02-25 NOTE — MAU Note (Signed)
Patient states she was at work and a heavy can fell onto her upper abdomen. Started having stabbing pain in the upper abdomen about 10 minutes later and having lower abdominal pressure. Patient denies any bleeding or leaking.

## 2012-02-25 NOTE — MAU Note (Signed)
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 Denies bleeding or abnormal vaginal discharge. Last intercourse Saturday. States pain/pressure no better or worse after voiding.

## 2012-03-19 ENCOUNTER — Encounter (HOSPITAL_COMMUNITY): Payer: Self-pay | Admitting: *Deleted

## 2012-03-19 ENCOUNTER — Inpatient Hospital Stay (HOSPITAL_COMMUNITY)
Admission: AD | Admit: 2012-03-19 | Discharge: 2012-03-19 | Disposition: A | Discharge status: Home / Self Care | Payer: Medicaid Other | Source: Ambulatory Visit | Attending: Obstetrics | Admitting: Obstetrics

## 2012-03-19 DIAGNOSIS — R059 Cough, unspecified: Secondary | ICD-10-CM | POA: Insufficient documentation

## 2012-03-19 DIAGNOSIS — O99891 Other specified diseases and conditions complicating pregnancy: Secondary | ICD-10-CM | POA: Insufficient documentation

## 2012-03-19 DIAGNOSIS — J069 Acute upper respiratory infection, unspecified: Secondary | ICD-10-CM | POA: Insufficient documentation

## 2012-03-19 DIAGNOSIS — R05 Cough: Secondary | ICD-10-CM | POA: Insufficient documentation

## 2012-03-19 MED ORDER — AZITHROMYCIN 250 MG PO TABS: ORAL_TABLET | ORAL | Status: AC

## 2012-03-19 NOTE — Discharge Instructions (Signed)

## 2012-03-19 NOTE — MAU Provider Note (Signed)
  History     CSN: [id number]  Arrival date and time: 03/19/12 4098   First Provider Initiated Contact with Patient 03/19/12 0759      Chief Complaint  Patient presents with  . Shortness of Breath   HPIWendy C Melton is 26 y.o. G3P1011 [redacted]w[redacted]d weeks presenting with congestion and cough.  She was seen by Dr. Clearance Coots last week and was instructed to take Robitussin for the cough.  She states it has not helped.  She comes here this am for evaluation.  She denies fever.  States she is having body aching.  Occ constipation but denies nausea, vomiting, and diarrhea.  Nasal congestion with yellow drainage.  Sinus pain.     Past Medical History  Diagnosis Date  . No pertinent past medical history     Past Surgical History  Procedure Date  . No past surgeries     Family History  Problem Relation Age of Onset  . Diabetes Father   . Hypertension Father   . Anesthesia problems Neg Hx     History  Substance Use Topics  . Smoking status: Never Smoker   . Smokeless tobacco: Never Used  . Alcohol Use: No     occasionally, not with preg    Allergies: No Known Allergies  Prescriptions prior to admission  Medication Sig Dispense Refill  . cholecalciferol (VITAMIN D) 1000 UNITS tablet Take 1,000 Units by mouth daily.      . Prenatal Vit-Fe Fumarate-FA (PRENATAL MULTIVITAMIN) TABS Take 1 tablet by mouth daily.        Review of Systems  Constitutional: Negative for fever (+ body aches) and chills.  HENT:       Tenderness over and under her eyes  Respiratory: Positive for cough and shortness of breath. Negative for wheezing.   Gastrointestinal: Positive for constipation (occ). Negative for nausea, vomiting and diarrhea.   Physical Exam   Blood pressure 119/80, pulse 92, temperature 98.4 F (36.9 C), temperature source Oral, resp. rate 22, height 5\' 4"  (1.626 m), weight 109.68 kg (241 lb 12.8 oz), last menstrual period 11/06/2011, SpO2 98.00%.  Physical Exam  Constitutional: She is  oriented to person, place, and time. She appears well-developed and well-nourished. No distress.  HENT:  Head: Normocephalic.  Nose: Mucosal edema and rhinorrhea present. Right sinus exhibits no maxillary sinus tenderness and no frontal sinus tenderness. Left sinus exhibits no maxillary sinus tenderness and no frontal sinus tenderness.  Neck: Normal range of motion.  Cardiovascular: Normal rate.   Respiratory: She has no wheezes.       + dry cough  Neurological: She is alert and oriented to person, place, and time.  Skin: Skin is warm and dry.  Psychiatric: She has a normal mood and affect. Her behavior is normal. Thought content normal.    MAU Course  Procedures  MDM Paged Dr. Clearance Coots at 8:15 left message to return my call.   Paged at 8:32.  No answer will report on patient when he returns call.  Will discharge her.  Assessment and Plan  A:  URI   [redacted]w[redacted]d gestation  P:  Continue Robitussin      Rx for Zithromax     Encouraged to stay well hydrated    Call Dr. Clearance Coots for worsening sxs.  Tekla Malachowski,EVE M 03/19/2012, 8:03 AM

## 2012-03-19 NOTE — MAU Note (Signed)
Pt C/O HA & cold sx's that started Wednesday, started taking Robitussin & Tylenol.  Pt states she started wheezing & having SOB last night.

## 2012-07-23 ENCOUNTER — Inpatient Hospital Stay (HOSPITAL_COMMUNITY): Payer: Medicaid Other | Admitting: Anesthesiology

## 2012-07-23 ENCOUNTER — Inpatient Hospital Stay (HOSPITAL_COMMUNITY)
Admission: AD | Admit: 2012-07-23 | Discharge: 2012-07-25 | DRG: 774 | Disposition: A | Discharge status: Home / Self Care | Payer: Medicaid Other | Source: Ambulatory Visit | Attending: Obstetrics | Admitting: Obstetrics

## 2012-07-23 ENCOUNTER — Encounter (HOSPITAL_COMMUNITY): Payer: Self-pay | Admitting: Anesthesiology

## 2012-07-23 ENCOUNTER — Encounter (HOSPITAL_COMMUNITY): Payer: Self-pay | Admitting: *Deleted

## 2012-07-23 DIAGNOSIS — O4590 Premature separation of placenta, unspecified, unspecified trimester: Secondary | ICD-10-CM | POA: Diagnosis present

## 2012-07-23 LAB — HEMOGLOBIN AND HEMATOCRIT, BLOOD: Hemoglobin: 10.4 g/dL — ABNORMAL LOW (ref 12.0–15.0)

## 2012-07-23 LAB — CBC
MCH: 28.2 pg (ref 26.0–34.0)
MCHC: 33.8 g/dL (ref 30.0–36.0)
MCV: 83.4 fL (ref 78.0–100.0)
Platelets: 177 10*3/uL (ref 150–400)
RDW: 13.3 % (ref 11.5–15.5)
WBC: 7.7 10*3/uL (ref 4.0–10.5)

## 2012-07-23 LAB — PROTIME-INR
INR: 1.06 (ref 0.00–1.49)
Prothrombin Time: 13.7 seconds (ref 11.6–15.2)

## 2012-07-23 LAB — ABO/RH: ABO/RH(D): B POS

## 2012-07-23 MED ORDER — MISOPROSTOL 200 MCG PO TABS
ORAL_TABLET | ORAL | Status: AC
  Filled 2012-07-23: qty 8

## 2012-07-23 MED ORDER — ONDANSETRON HCL 4 MG/2ML IJ SOLN
4.0000 mg | Freq: Four times a day (QID) | INTRAMUSCULAR | Status: DC | PRN
Start: 2012-07-23 — End: 2012-07-23

## 2012-07-23 MED ORDER — PENICILLIN G POTASSIUM 5000000 UNITS IJ SOLR: 2.5000 10*6.[IU] | INTRAVENOUS | Status: DC

## 2012-07-23 MED ORDER — PHENYLEPHRINE 40 MCG/ML (10ML) SYRINGE FOR IV PUSH (FOR BLOOD PRESSURE SUPPORT): 80.0000 ug | PREFILLED_SYRINGE | INTRAVENOUS | Status: DC | PRN

## 2012-07-23 MED ORDER — FERROUS SULFATE 325 (65 FE) MG PO TABS
325.0000 mg | ORAL_TABLET | Freq: Two times a day (BID) | ORAL | Status: DC
  Administered 2012-07-23 – 2012-07-25 (×4): 325 mg via ORAL
  Filled 2012-07-23 (×4): qty 1

## 2012-07-23 MED ORDER — DIPHENOXYLATE-ATROPINE 2.5-0.025 MG PO TABS
2.0000 | ORAL_TABLET | Freq: Four times a day (QID) | ORAL | Status: DC | PRN
  Administered 2012-07-23: 2 via ORAL
  Filled 2012-07-23 (×2): qty 1

## 2012-07-23 MED ORDER — BENZOCAINE-MENTHOL 20-0.5 % EX AERO
1.0000 "application " | INHALATION_SPRAY | CUTANEOUS | Status: DC | PRN
  Filled 2012-07-23: qty 56

## 2012-07-23 MED ORDER — EPHEDRINE 5 MG/ML INJ
10.0000 mg | INTRAVENOUS | Status: DC | PRN
Start: 2012-07-23 — End: 2012-07-23

## 2012-07-23 MED ORDER — IBUPROFEN 600 MG PO TABS
600.0000 mg | ORAL_TABLET | Freq: Four times a day (QID) | ORAL | Status: DC
  Administered 2012-07-24 – 2012-07-25 (×6): 600 mg via ORAL
  Filled 2012-07-23 (×7): qty 1

## 2012-07-23 MED ORDER — NALBUPHINE SYRINGE 5 MG/0.5 ML
10.0000 mg | INJECTION | Freq: Four times a day (QID) | INTRAMUSCULAR | Status: DC | PRN
  Administered 2012-07-23: 10 mg via INTRAMUSCULAR
  Filled 2012-07-23 (×2): qty 1

## 2012-07-23 MED ORDER — PROMETHAZINE HCL 25 MG/ML IJ SOLN: 25.0000 mg | Freq: Four times a day (QID) | INTRAMUSCULAR | Status: DC | PRN

## 2012-07-23 MED ORDER — WITCH HAZEL-GLYCERIN EX PADS: 1.0000 "application " | MEDICATED_PAD | CUTANEOUS | Status: DC | PRN

## 2012-07-23 MED ORDER — MEASLES, MUMPS & RUBELLA VAC ~~LOC~~ INJ: 0.5000 mL | INJECTION | Freq: Once | SUBCUTANEOUS | Status: DC

## 2012-07-23 MED ORDER — FENTANYL 2.5 MCG/ML BUPIVACAINE 1/10 % EPIDURAL INFUSION (WH - ANES)
INTRAMUSCULAR | Status: DC | PRN
  Administered 2012-07-23: 14 mL/h via EPIDURAL

## 2012-07-23 MED ORDER — LIDOCAINE HCL (PF) 1 % IJ SOLN
30.0000 mL | INTRAMUSCULAR | Status: AC | PRN
  Administered 2012-07-23: 30 mL via SUBCUTANEOUS
  Filled 2012-07-23: qty 30

## 2012-07-23 MED ORDER — OXYTOCIN BOLUS FROM INFUSION
500.0000 mL | Freq: Once | INTRAVENOUS | Status: DC
  Filled 2012-07-23: qty 500

## 2012-07-23 MED ORDER — IBUPROFEN 600 MG PO TABS
600.0000 mg | ORAL_TABLET | Freq: Four times a day (QID) | ORAL | Status: DC | PRN
  Administered 2012-07-23: 600 mg via ORAL
  Filled 2012-07-23: qty 1

## 2012-07-23 MED ORDER — CARBOPROST TROMETHAMINE 250 MCG/ML IM SOLN
250.0000 ug | INTRAMUSCULAR | Status: DC | PRN
  Administered 2012-07-23 (×2): 250 ug via INTRAMUSCULAR

## 2012-07-23 MED ORDER — DIPHENHYDRAMINE HCL 50 MG/ML IJ SOLN: 12.5000 mg | INTRAMUSCULAR | Status: DC | PRN

## 2012-07-23 MED ORDER — DIBUCAINE 1 % RE OINT: 1.0000 "application " | TOPICAL_OINTMENT | RECTAL | Status: DC | PRN

## 2012-07-23 MED ORDER — SENNOSIDES-DOCUSATE SODIUM 8.6-50 MG PO TABS
2.0000 | ORAL_TABLET | Freq: Every day | ORAL | Status: DC
  Administered 2012-07-23 – 2012-07-24 (×2): 2 via ORAL

## 2012-07-23 MED ORDER — ONDANSETRON HCL 4 MG/2ML IJ SOLN: 4.0000 mg | INTRAMUSCULAR | Status: DC | PRN

## 2012-07-23 MED ORDER — TETANUS-DIPHTH-ACELL PERTUSSIS 5-2.5-18.5 LF-MCG/0.5 IM SUSP: 0.5000 mL | Freq: Once | INTRAMUSCULAR | Status: DC

## 2012-07-23 MED ORDER — SODIUM BICARBONATE 8.4 % IV SOLN
INTRAVENOUS | Status: DC | PRN
  Administered 2012-07-23: 4 mL via EPIDURAL

## 2012-07-23 MED ORDER — LANOLIN HYDROUS EX OINT: TOPICAL_OINTMENT | CUTANEOUS | Status: DC | PRN

## 2012-07-23 MED ORDER — ONDANSETRON HCL 4 MG PO TABS: 4.0000 mg | ORAL_TABLET | ORAL | Status: DC | PRN

## 2012-07-23 MED ORDER — LACTATED RINGERS IV SOLN
INTRAVENOUS | Status: DC
  Administered 2012-07-23: 22:00:00 via INTRAVENOUS

## 2012-07-23 MED ORDER — PRENATAL MULTIVITAMIN CH
1.0000 | ORAL_TABLET | Freq: Every day | ORAL | Status: DC
  Administered 2012-07-24: 1 via ORAL
  Filled 2012-07-23: qty 1

## 2012-07-23 MED ORDER — LACTATED RINGERS IV SOLN: 500.0000 mL | INTRAVENOUS | Status: DC | PRN

## 2012-07-23 MED ORDER — LACTATED RINGERS IV SOLN
INTRAVENOUS | Status: DC
  Administered 2012-07-23: 12:00:00 via INTRAVENOUS

## 2012-07-23 MED ORDER — ZOLPIDEM TARTRATE 5 MG PO TABS: 5.0000 mg | ORAL_TABLET | Freq: Every evening | ORAL | Status: DC | PRN

## 2012-07-23 MED ORDER — OXYCODONE-ACETAMINOPHEN 5-325 MG PO TABS: 1.0000 | ORAL_TABLET | ORAL | Status: DC | PRN

## 2012-07-23 MED ORDER — LACTATED RINGERS IV SOLN
INTRAVENOUS | Status: DC
  Administered 2012-07-23 (×2): via INTRAVENOUS

## 2012-07-23 MED ORDER — ACETAMINOPHEN 325 MG PO TABS: 650.0000 mg | ORAL_TABLET | ORAL | Status: DC | PRN

## 2012-07-23 MED ORDER — DIPHENHYDRAMINE HCL 25 MG PO CAPS: 25.0000 mg | ORAL_CAPSULE | Freq: Four times a day (QID) | ORAL | Status: DC | PRN

## 2012-07-23 MED ORDER — OXYTOCIN 40 UNITS IN LACTATED RINGERS INFUSION - SIMPLE MED
62.5000 mL/h | Freq: Once | INTRAVENOUS | Status: AC
  Administered 2012-07-23: 62.5 mL/h via INTRAVENOUS
  Filled 2012-07-23: qty 1000

## 2012-07-23 MED ORDER — MAGNESIUM HYDROXIDE 400 MG/5ML PO SUSP: 30.0000 mL | ORAL | Status: DC | PRN

## 2012-07-23 MED ORDER — NALBUPHINE SYRINGE 5 MG/0.5 ML
10.0000 mg | INJECTION | INTRAMUSCULAR | Status: DC | PRN
  Administered 2012-07-23: 10 mg via INTRAVENOUS
  Filled 2012-07-23 (×2): qty 1

## 2012-07-23 MED ORDER — FENTANYL 2.5 MCG/ML BUPIVACAINE 1/10 % EPIDURAL INFUSION (WH - ANES)
14.0000 mL/h | INTRAMUSCULAR | Status: DC
  Administered 2012-07-23: 14 mL/h via EPIDURAL
  Filled 2012-07-23 (×2): qty 60

## 2012-07-23 MED ORDER — EPHEDRINE 5 MG/ML INJ
10.0000 mg | INTRAVENOUS | Status: DC | PRN
  Filled 2012-07-23: qty 4

## 2012-07-23 MED ORDER — LACTATED RINGERS IV SOLN
500.0000 mL | Freq: Once | INTRAVENOUS | Status: AC
  Administered 2012-07-23: 500 mL via INTRAVENOUS

## 2012-07-23 MED ORDER — CITRIC ACID-SODIUM CITRATE 334-500 MG/5ML PO SOLN
30.0000 mL | ORAL | Status: DC | PRN
  Filled 2012-07-23: qty 15

## 2012-07-23 MED ORDER — PHENYLEPHRINE 40 MCG/ML (10ML) SYRINGE FOR IV PUSH (FOR BLOOD PRESSURE SUPPORT)
80.0000 ug | PREFILLED_SYRINGE | INTRAVENOUS | Status: DC | PRN
  Filled 2012-07-23: qty 5

## 2012-07-23 MED ORDER — PENICILLIN G POTASSIUM 5000000 UNITS IJ SOLR
2.5000 10*6.[IU] | INTRAMUSCULAR | Status: DC
  Administered 2012-07-23 (×2): 2.5 10*6.[IU] via INTRAVENOUS
  Filled 2012-07-23 (×6): qty 2.5

## 2012-07-23 MED ORDER — PENICILLIN G POTASSIUM 5000000 UNITS IJ SOLR
5.0000 10*6.[IU] | Freq: Once | INTRAVENOUS | Status: AC
  Administered 2012-07-23: 5 10*6.[IU] via INTRAVENOUS
  Filled 2012-07-23: qty 5

## 2012-07-23 MED ORDER — PENICILLIN G POTASSIUM 5000000 UNITS IJ SOLR: 5.0000 10*6.[IU] | Freq: Once | INTRAVENOUS | Status: DC

## 2012-07-23 NOTE — MAU Provider Note (Signed)
Chief Complaint:  Vaginal Bleeding and Abd Pain  Amanda Melton is  26 y.o. G3P1011.  Patient's last menstrual period was 11/06/2011.Marland Kitchen  Her pregnancy status is positive. [redacted]w[redacted]d  . Pt reports sudden onset of heavy vaginal bleeding with clots that ran down legs at 0200. States contractions started shortly after bleeding began. Pt reports pos GBS  Obstetrical/Gynecological History: OB History    Grav Para Term Preterm Abortions TAB SAB Ect Mult Living   3 1 1  0 1 0 1 0 0 1      Past Medical History: Past Medical History  Diagnosis Date  . No pertinent past medical history     Past Surgical History: Past Surgical History  Procedure Date  . No past surgeries     Family History: Family History  Problem Relation Age of Onset  . Diabetes Father   . Hypertension Father   . Anesthesia problems Neg Hx     Social History: History  Substance Use Topics  . Smoking status: Never Smoker   . Smokeless tobacco: Never Used  . Alcohol Use: No     occasionally, not with preg    Allergies: No Known Allergies  Prescriptions prior to admission  Medication Sig Dispense Refill  . acetaminophen (TYLENOL) 500 MG tablet Take 1,000 mg by mouth daily as needed. For pain      . cholecalciferol (VITAMIN D) 1000 UNITS tablet Take 1,000 Units by mouth daily.      Marland Kitchen guaifenesin (ROBITUSSIN) 100 MG/5ML syrup Take 200 mg by mouth 4 (four) times daily as needed. for cough.      . Prenatal Vit-Fe Fumarate-FA (PRENATAL MULTIVITAMIN) TABS Take 1 tablet by mouth daily.      . traMADol (ULTRAM) 50 MG tablet Take 50 mg by mouth 1 day or 1 dose.        Review of Systems - History obtained from the patient Respiratory ROS: no cough, shortness of breath, or wheezing Cardiovascular ROS: no chest pain or dyspnea on exertion Gastrointestinal ROS: positive for - abdominal pain Genito-Urinary ROS: no dysuria, trouble voiding, or hematuria Positive for vaginal bleeding  Physical Exam   Blood pressure 122/79,  pulse 91, temperature 98 F (36.7 C), temperature source Oral, resp. rate 18, height 5\' 5"  (1.651 m), weight 269 lb (122.018 kg), last menstrual period 11/06/2011.  General: General appearance - alert, well appearing, and in no distress, oriented to person, place, and time and overweight Mental status - alert, oriented to person, place, and time, normal mood, behavior, speech, dress, motor activity, and thought processes, affect appropriate to mood Abdomen - gravid, non tender, contracting, adequate resting tone Focused Gynecological Exam: VULVA: blood stained with quarter-size clot adherent to labia, VAGINA: mod amount of bleeding in vault, CERVIX: 4-5/80/vtx/-2/BBOW. Quarter-sized clot on glove after exam.  FHR: 140, decreased variability, no decels Toco: 3-5   Labs: No results found for this or any previous visit (from the past 24 hour(s)). Imaging Studies:  No results found.   Assessment: Active Labor Heavy bloody show vs marginal seperation  Plan: RN to notify Dr. Clearance Coots for admission orders Pt stable  Elana Jian E. 07/23/2012,3:24 AM

## 2012-07-23 NOTE — Anesthesia Procedure Notes (Addendum)
Epidural Patient location during procedure: OB  Preanesthetic Checklist Completed: patient identified, site marked, surgical consent, pre-op evaluation, timeout performed, IV checked, risks and benefits discussed and monitors and equipment checked  Epidural Patient position: sitting Prep: site prepped and draped and DuraPrep Patient monitoring: continuous pulse ox and blood pressure Approach: midline Injection technique: LOR air  Needle:  Needle type: Tuohy  Needle gauge: 17 G Needle length: 9 cm and 9 Needle insertion depth: 7 cm Catheter type: closed end flexible Catheter size: 19 Gauge Catheter at skin depth: 14 cm Test dose: negative  Assessment Events: blood not aspirated, injection not painful, no injection resistance, negative IV test and no paresthesia  Additional Notes Dosing of Epidural:  1st dose, through needle ............................................Marland Kitchen epi 1:200K + Xylocaine 40 mg  2nd dose, through catheter, after waiting 3 minutes...Marland KitchenMarland Kitchenepi 1:200K + Xylocaine 40 mg  3rd dose, through catheter after waiting 3 minutes .............................Marcaine   4mg    ( mg Marcaine are expressed as equivilent  cc's medication removed from the 0.1%Bupiv / fentanyl syringe from L&D pump)  ( 2% Xylo charted as a single dose in Epic Meds for ease of charting; actual dosing was fractionated as above, for saftey's sake)  As each dose occurred, patient was free of IV sx; and patient exhibited no evidence of SA injection.  Patient is more comfortable after epidural dosed. Please see RN's note for documentation of vital signs,and FHR which are stable.  Patient reminded not to try to ambulate with numb legs, and that an RN must be present the 1st time she attempts to get up.

## 2012-07-23 NOTE — Anesthesia Preprocedure Evaluation (Signed)
Anesthesia Evaluation  Patient identified by MRN, date of birth, ID band Patient awake    Reviewed: Allergy & Precautions, H&P , Patient's Chart, lab work & pertinent test results  Airway Mallampati: III  TM Distance: >3 FB Neck ROM: full    Dental  (+) Teeth Intact   Pulmonary  breath sounds clear to auscultation        Cardiovascular Rhythm:regular Rate:Normal     Neuro/Psych    GI/Hepatic   Endo/Other  Morbid obesity  Renal/GU      Musculoskeletal   Abdominal   Peds  Hematology   Anesthesia Other Findings       Reproductive/Obstetrics (+) Pregnancy                             Anesthesia Physical Anesthesia Plan  ASA: III  Anesthesia Plan: Epidural   Post-op Pain Management:    Induction:   Airway Management Planned:   Additional Equipment:   Intra-op Plan:   Post-operative Plan:   Informed Consent: I have reviewed the patients History and Physical, chart, labs and discussed the procedure including the risks, benefits and alternatives for the proposed anesthesia with the patient or authorized representative who has indicated his/her understanding and acceptance.   Dental Advisory Given  Plan Discussed with:   Anesthesia Plan Comments: (Labs checked- platelets confirmed with RN in room. Fetal heart tracing, per RN, reported to be stable enough for sitting procedure. Discussed epidural, and patient consents to the procedure:  included risk of possible headache,backache, failed block, allergic reaction, and nerve injury. This patient was asked if she had any questions or concerns before the procedure started.)        Anesthesia Quick Evaluation  

## 2012-07-23 NOTE — H&P (Signed)
ANDERSEN MCKIVER is a 26 y.o. female presenting for UC's. Maternal Medical History:  Reason for admission: Reason for admission: contractions and vaginal bleeding.  26 yo G3 P1   EDC 08-12-12.  Presents by EMS with UC and vaginal bleeding.  Contractions: Onset was 3-5 hours ago.   Frequency: regular.    Fetal activity: Perceived fetal activity is normal.   Last perceived fetal movement was within the past hour.    Prenatal complications: no prenatal complications Prenatal Complications - Diabetes: none.    OB History    Grav Para Term Preterm Abortions TAB SAB Ect Mult Living   3 1 1  0 1 0 1 0 0 1     Past Medical History  Diagnosis Date  . No pertinent past medical history    Past Surgical History  Procedure Date  . No past surgeries    Family History: family history includes Diabetes in her father and Hypertension in her father.  There is no history of Anesthesia problems. Social History:  reports that she has never smoked. She has never used smokeless tobacco. She reports that she does not drink alcohol or use illicit drugs.   Prenatal Transfer Tool  Maternal Diabetes: No Genetic Screening: Normal Maternal Ultrasounds/Referrals: Normal Fetal Ultrasounds or other Referrals:  None Maternal Substance Abuse:  No Significant Maternal Medications:  None Significant Maternal Lab Results:  Lab values include: Other:  Other Comments:  None  Review of Systems  All other systems reviewed and are negative.    Dilation: 4.5 Effacement (%): 80 Station: -2 Exam by:: [place]. [place] CNM Blood pressure 128/64, pulse 93, temperature 98 F (36.7 C), temperature source Oral, resp. rate 18, height 5\' 5"  (1.651 m), weight 122.018 kg (269 lb), last menstrual period 11/06/2011. Maternal Exam:  Uterine Assessment: Contraction strength is moderate.  Abdomen: Patient reports no abdominal tenderness. Fetal presentation: vertex     Physical Exam  Nursing note and vitals  reviewed. Constitutional: She is oriented to person, place, and time. She appears well-developed and well-nourished.  HENT:  Head: Normocephalic and atraumatic.  Eyes: Conjunctivae normal are normal. Pupils are equal, round, and reactive to light.  Neck: Normal range of motion. Neck supple.  Cardiovascular: Normal rate and regular rhythm.   Respiratory: Effort normal and breath sounds normal.  GI: Soft.  Genitourinary: Vagina normal and uterus normal.  Musculoskeletal: Normal range of motion.  Neurological: She is alert and oriented to person, place, and time.  Skin: Skin is warm and dry.  Psychiatric: She has a normal mood and affect. Her behavior is normal. Judgment and thought content normal.    Prenatal labs: ABO, Rh:   Antibody:   Rubella:   RPR:    HBsAg:    HIV:    GBS:     Assessment/Plan: 37.1 weeks.  Early labor.  Expectant management.   HARPER,CHARLES A 07/23/2012, 6:02 AM

## 2012-07-23 NOTE — Anesthesia Postprocedure Evaluation (Signed)
Anesthesia Post Note  Patient: Amanda Melton  Procedure(s) Performed: * No procedures listed *  Anesthesia type: Epidural  Patient location: Mother/Baby  Post pain: Pain level controlled  Post assessment: Post-op Vital signs reviewed  Last Vitals:  Filed Vitals:   07/23/12 1630  BP: 121/78  Pulse: 70  Temp: 36.6 C  Resp: 18    Post vital signs: Reviewed  Level of consciousness:alert  Complications: No apparent anesthesia complications

## 2012-07-24 LAB — CBC
MCV: 84.1 fL (ref 78.0–100.0)
Platelets: 162 10*3/uL (ref 150–400)
RDW: 13.6 % (ref 11.5–15.5)
WBC: 11.9 10*3/uL — ABNORMAL HIGH (ref 4.0–10.5)

## 2012-07-24 NOTE — Progress Notes (Signed)
Patient ID: Amanda Melton, female   DOB: 01/14/1986, 26 y.o.   MRN: [id number] Postpartum day one Vital signs normal Fundus firm Legs negative Doing well

## 2012-07-25 NOTE — Discharge Summary (Signed)
Obstetric Discharge Summary Reason for Admission: onset of labor Prenatal Procedures: none Intrapartum Procedures: spontaneous vaginal delivery Postpartum Procedures: none Complications-Operative and Postpartum: none Hemoglobin  Date Value Range Status  07/24/2012 8.0* 12.0 - 15.0 g/dL Final     DELTA CHECK NOTED     REPEATED TO VERIFY     HCT  Date Value Range Status  07/24/2012 24.8* 36.0 - 46.0 % Final    Physical Exam:  General: alert Lochia: appropriate Uterine Fundus: firm Incision: healing well DVT Evaluation: No evidence of DVT seen on physical exam.  Discharge Diagnoses: Term Pregnancy-delivered  Discharge Information: Date: 07/25/2012 Activity: pelvic rest Diet: routine Medications: Percocet Condition: stable Instructions: refer to practice specific booklet Discharge to: home Follow-up Information    Call in 6 weeks to follow up.         Newborn Data: Live born female  Birth Weight: 6 lb 3.2 oz (2812 g) APGAR: 8, 9  Home with mother.  MARSHALL,BERNARD A 07/25/2012, 6:31 AM

## 2012-07-26 NOTE — Progress Notes (Signed)
Post discharge chart review completed.  

## 2012-07-27 LAB — TYPE AND SCREEN
Antibody Screen: NEGATIVE
Unit division: 0
Unit division: 0
Unit division: 0

## 2012-09-19 ENCOUNTER — Encounter (HOSPITAL_COMMUNITY): Payer: Self-pay | Admitting: *Deleted

## 2012-09-19 ENCOUNTER — Inpatient Hospital Stay (HOSPITAL_COMMUNITY)
Admission: AD | Admit: 2012-09-19 | Discharge: 2012-09-19 | Disposition: A | Discharge status: Home / Self Care | Payer: BC Managed Care – PPO | Source: Ambulatory Visit | Attending: Obstetrics | Admitting: Obstetrics

## 2012-09-19 DIAGNOSIS — N938 Other specified abnormal uterine and vaginal bleeding: Secondary | ICD-10-CM | POA: Insufficient documentation

## 2012-09-19 DIAGNOSIS — N949 Unspecified condition associated with female genital organs and menstrual cycle: Secondary | ICD-10-CM | POA: Insufficient documentation

## 2012-09-19 DIAGNOSIS — N921 Excessive and frequent menstruation with irregular cycle: Secondary | ICD-10-CM | POA: Diagnosis present

## 2012-09-19 LAB — CBC
Hemoglobin: 11.8 g/dL — ABNORMAL LOW (ref 12.0–15.0)
Platelets: 292 10*3/uL (ref 150–400)
RBC: 4.55 MIL/uL (ref 3.87–5.11)
WBC: 6.3 10*3/uL (ref 4.0–10.5)

## 2012-09-19 LAB — URINALYSIS, ROUTINE W REFLEX MICROSCOPIC
Bilirubin Urine: NEGATIVE
Glucose, UA: NEGATIVE mg/dL
Ketones, ur: NEGATIVE mg/dL
Leukocytes, UA: NEGATIVE
Protein, ur: NEGATIVE mg/dL

## 2012-09-19 LAB — URINE MICROSCOPIC-ADD ON

## 2012-09-19 MED ORDER — NORGESTIMATE-ETH ESTRADIOL 0.25-35 MG-MCG PO TABS: ORAL_TABLET | ORAL | Status: DC

## 2012-09-19 MED ORDER — IBUPROFEN 600 MG PO TABS: 600.0000 mg | ORAL_TABLET | Freq: Four times a day (QID) | ORAL | Status: DC | PRN

## 2012-09-19 NOTE — MAU Provider Note (Signed)
Chief Complaint: Vaginal Bleeding   First Provider Initiated Contact with Patient 09/19/12 1558     SUBJECTIVE HPI: Amanda Melton is a 26 y.o. [id number]  who presents to maternity admissions reporting vaginal bleeding daily described as menstrual bleeding and requiring a pad since her Depo Provera injection on 11/1.  She had an NSVD on 07/23/12 with postpartum hemorrhage.  She is taking daily iron.  She denies vaginal itching/burning, urinary symptoms, h/a, dizziness, n/v, or fever/chills.     Past Medical History  Diagnosis Date  . No pertinent past medical history    Past Surgical History  Procedure Date  . No past surgeries    History   Social History  . Marital Status: Single    Spouse Name: N/A    Number of Children: N/A  . Years of Education: N/A   Occupational History  . Not on file.   Social History Main Topics  . Smoking status: Never Smoker   . Smokeless tobacco: Never Used  . Alcohol Use: No     Comment: occasionally, not with preg  . Drug Use: No  . Sexually Active: Yes    Birth Control/ Protection: None   Other Topics Concern  . Not on file   Social History Narrative  . No narrative on file   No current facility-administered medications on file prior to encounter.   Current Outpatient Prescriptions on File Prior to Encounter  Medication Sig Dispense Refill  . Calcium Carbonate (CALCIUM 500 PO) Take 1 tablet by mouth daily.      . carvedilol (COREG) 6.25 MG tablet Take 6.25 mg by mouth 2 (two) times daily.      Marland Kitchen triamterene-hydrochlorothiazide (MAXZIDE-25) 37.5-25 MG per tablet Take 1 tablet by mouth daily.       No Known Allergies  ROS: Pertinent items in HPI  OBJECTIVE Blood pressure 129/84, pulse 64, temperature 98.2 F (36.8 C), temperature source Oral, resp. rate 18, height 5\' 5"  (1.651 m), weight 111.313 kg (245 lb 6.4 oz). GENERAL: Well-developed, well-nourished female in no acute distress.  HEENT: Normocephalic HEART: normal rate RESP:  normal effort ABDOMEN: Soft, non-tender EXTREMITIES: Nontender, no edema NEURO: Alert and oriented Pelvic exam: Cervix pink, visually closed, without lesion, large amount bright red bleeding from os and pooling in vagina, vaginal walls and external genitalia normal Bimanual exam: Cervix 0/long/high, firm, anterior, neg CMT, uterus mildly tender, nonenlarged, adnexa without tenderness, enlargement, or mass  LAB RESULTS Results for orders placed during the hospital encounter of 09/19/12 (from the past 24 hour(s))  URINALYSIS, ROUTINE W REFLEX MICROSCOPIC     Status: Abnormal   Collection Time   09/19/12  2:40 PM      Component Value Range   Color, Urine YELLOW  YELLOW   APPearance HAZY (*) CLEAR   Specific Gravity, Urine 1.020  1.005 - 1.030   pH 6.0  5.0 - 8.0   Glucose, UA NEGATIVE  NEGATIVE mg/dL   Hgb urine dipstick LARGE (*) NEGATIVE   Bilirubin Urine NEGATIVE  NEGATIVE   Ketones, ur NEGATIVE  NEGATIVE mg/dL   Protein, ur NEGATIVE  NEGATIVE mg/dL   Urobilinogen, UA 0.2  0.0 - 1.0 mg/dL   Nitrite NEGATIVE  NEGATIVE   Leukocytes, UA NEGATIVE  NEGATIVE  URINE MICROSCOPIC-ADD ON     Status: Abnormal   Collection Time   09/19/12  2:40 PM      Component Value Range   Squamous Epithelial / LPF FEW (*) RARE   WBC, UA  0-2  <3 WBC/hpf   RBC / HPF 21-50  <3 RBC/hpf   Bacteria, UA RARE  RARE  POCT PREGNANCY, URINE     Status: Normal   Collection Time   09/19/12  2:51 PM      Component Value Range   Preg Test, Ur NEGATIVE  NEGATIVE  CBC     Status: Abnormal   Collection Time   09/19/12  4:29 PM      Component Value Range   WBC 6.3  4.0 - 10.5 K/uL   RBC 4.55  3.87 - 5.11 MIL/uL   Hemoglobin 11.8 (*) 12.0 - 15.0 g/dL   HCT 16.1  09.6 - 04.5 %   MCV 80.2  78.0 - 100.0 fL   MCH 25.9 (*) 26.0 - 34.0 pg   MCHC 32.3  30.0 - 36.0 g/dL   RDW 40.9 (*) 81.1 - 91.4 %   Platelets 292  150 - 400 K/uL    ASSESSMENT 1. Breakthrough bleeding on depo provera     PLAN Called Dr Clearance Coots  to discuss assessment and findings Discharge home OCPs Q8 hours x7 days Continue PO iron as prescribed F/U with Dr Clearance Coots Return to MAU as needed    Medication List     As of 09/19/2012  4:07 PM    ASK your doctor about these medications         CALCIUM 500 PO   Take 1 tablet by mouth daily.      carvedilol 6.25 MG tablet   Commonly known as: COREG   Take 6.25 mg by mouth 2 (two) times daily.      prenatal multivitamin Tabs   Take 1 tablet by mouth daily.      triamterene-hydrochlorothiazide 37.5-25 MG per tablet   Commonly known as: MAXZIDE-25   Take 1 tablet by mouth daily.      VITAMIN D PO   Take 1 tablet by mouth daily. Pt takes OTC; does not know strength         Sharen Counter Certified Nurse-Midwife 09/19/2012  4:07 PM

## 2012-09-19 NOTE — MAU Note (Signed)
Pt reports she delivered 07/23/12. Stopped bleeding on oct 21. Got depo shot on 11/1 and has been having vaginal bleeding since 11/3. Pt reports having occasional sharp pain in abd

## 2013-02-16 ENCOUNTER — Ambulatory Visit (INDEPENDENT_AMBULATORY_CARE_PROVIDER_SITE_OTHER): Payer: BC Managed Care – PPO | Admitting: *Deleted

## 2013-02-16 VITALS — BP 130/91 | HR 64 | Temp 98.3°F | Ht 65.0 in | Wt 256.0 lb

## 2013-02-16 DIAGNOSIS — IMO0001 Reserved for inherently not codable concepts without codable children: Secondary | ICD-10-CM

## 2013-02-16 DIAGNOSIS — Z3049 Encounter for surveillance of other contraceptives: Secondary | ICD-10-CM

## 2013-02-16 DIAGNOSIS — R58 Hemorrhage, not elsewhere classified: Secondary | ICD-10-CM

## 2013-02-16 LAB — CBC
HCT: 38.3 % (ref 36.0–46.0)
Hemoglobin: 13.1 g/dL (ref 12.0–15.0)
MCV: 82.9 fL (ref 78.0–100.0)
RBC: 4.62 MIL/uL (ref 3.87–5.11)
WBC: 7.5 10*3/uL (ref 4.0–10.5)

## 2013-02-16 MED ORDER — MEDROXYPROGESTERONE ACETATE 150 MG/ML IM SUSP
150.0000 mg | INTRAMUSCULAR | Status: DC
  Administered 2013-02-16: 150 mg via INTRAMUSCULAR

## 2013-02-16 NOTE — Addendum Note (Signed)
Addended by: George Hugh on: 02/16/2013 05:10 PM   Modules accepted: Orders

## 2013-02-16 NOTE — Progress Notes (Signed)
Patient is here today for her depo injection.  Medroxyprogesterone 150mg  1ml IM to her right GM.  Lot #[id number]  Exp- 04/2015.  Patient tolerated well. RTO 05/10/13.

## 2013-02-22 ENCOUNTER — Encounter: Payer: Self-pay | Admitting: Obstetrics

## 2013-04-04 ENCOUNTER — Ambulatory Visit: Payer: BC Managed Care – PPO | Admitting: Obstetrics

## 2013-04-05 ENCOUNTER — Encounter: Payer: Self-pay | Admitting: Obstetrics

## 2013-04-05 ENCOUNTER — Ambulatory Visit (INDEPENDENT_AMBULATORY_CARE_PROVIDER_SITE_OTHER): Payer: BC Managed Care – PPO | Admitting: Obstetrics

## 2013-04-05 VITALS — BP 137/90 | HR 85 | Temp 98.1°F | Ht 65.0 in | Wt 254.8 lb

## 2013-04-05 DIAGNOSIS — N926 Irregular menstruation, unspecified: Secondary | ICD-10-CM

## 2013-04-05 NOTE — Progress Notes (Signed)
  Subjective:     Amanda Melton is a 27 y.o. woman who presents for irregular menses. No LMP recorded. Patient has had an injection. Menarche age: 60 Periods are regular every 28-30 days, lasting 4 days. Dysmenorrhea:mild, occurring throughout cycle. Cyclic symptoms include: changes in libido and moodiness. Current contraception: Depo-Provera injections.History of infertility: no. History of abnormal Pap smear: no.  The following portions of the patient's history were reviewed and updated as appropriate: allergies, current medications, past family history, past medical history, past social history, past surgical history and problem list.  Review of Systems Pertinent items are noted in HPI.     Objective:    General appearance: alert    Assessment:    The patient has AUB.    Plan:    Diagnosis explained in detail. All questions answered. Agricultural engineer distributed. F/U in 3 months.  OCP's Rx.

## 2013-04-07 ENCOUNTER — Encounter: Payer: Self-pay | Admitting: Obstetrics

## 2013-04-07 ENCOUNTER — Ambulatory Visit (INDEPENDENT_AMBULATORY_CARE_PROVIDER_SITE_OTHER): Payer: BC Managed Care – PPO | Admitting: Obstetrics

## 2013-04-07 VITALS — BP 137/89 | HR 85 | Temp 97.2°F | Ht 65.0 in | Wt 254.8 lb

## 2013-04-07 DIAGNOSIS — N760 Acute vaginitis: Secondary | ICD-10-CM

## 2013-04-07 DIAGNOSIS — N920 Excessive and frequent menstruation with regular cycle: Secondary | ICD-10-CM

## 2013-04-07 DIAGNOSIS — Z113 Encounter for screening for infections with a predominantly sexual mode of transmission: Secondary | ICD-10-CM

## 2013-04-07 DIAGNOSIS — Z3009 Encounter for other general counseling and advice on contraception: Secondary | ICD-10-CM

## 2013-04-07 MED ORDER — DOXYCYCLINE HYCLATE 50 MG PO CAPS: 50.0000 mg | ORAL_CAPSULE | Freq: Two times a day (BID) | ORAL | Status: DC

## 2013-04-07 MED ORDER — METRONIDAZOLE 500 MG PO TABS: 500.0000 mg | ORAL_TABLET | Freq: Two times a day (BID) | ORAL | Status: DC

## 2013-04-07 MED ORDER — LEVONORGESTREL-ETHINYL ESTRAD 0.15-30 MG-MCG PO TABS: 1.0000 | ORAL_TABLET | Freq: Every day | ORAL | Status: DC

## 2013-04-07 NOTE — Progress Notes (Signed)
Subjective:     Amanda Melton is a 27 y.o. female here for problem visit.  Current complaints: irregular vaginal bleeding.     Gynecologic History Patient's last menstrual period was 04/05/2013. Contraception: Depo-Provera injections and OCP (estrogen/progesterone)   Obstetric History OB History   Grav Para Term Preterm Abortions TAB SAB Ect Mult Living   3 2 2  0 1 0 1 0 0 2     # Outc Date GA Lbr Len/2nd Wgt Sex Del Anes PTL Lv   1 TRM 9/13 [redacted]w[redacted]d 09:11 / 00:08 6lb3.2oz(2.812kg) M SVD EPI  Yes   Comments: none   2 TRM            3 SAB                The following portions of the patient's history were reviewed and updated as appropriate: allergies, current medications, past family history, past medical history, past social history, past surgical history and problem list.  Review of Systems Pertinent items are noted in HPI.    Objective:    General appearance: alert and no distress Abdomen: normal findings: soft, non-tender Pelvic: cervix normal in appearance, external genitalia normal, no adnexal masses or tenderness, no cervical motion tenderness, uterus normal size, shape, and consistency and vagina with malodorous menstrual blood.   Cultures and wet prep done.  Assessment:    AUB on Depo Provera.   Plan:    Contraception: Depo-Provera injections and is having AUB.  Recommended stopping Depo and starting OCP's.. Follow up in: 2 weeks. [place] 28 Rx.   U/S ordered.

## 2013-04-08 LAB — WET PREP BY MOLECULAR PROBE
Candida species: NEGATIVE
Trichomonas vaginosis: NEGATIVE

## 2013-04-08 LAB — GC/CHLAMYDIA PROBE AMP: GC Probe RNA: NEGATIVE

## 2013-04-09 DIAGNOSIS — N939 Abnormal uterine and vaginal bleeding, unspecified: Secondary | ICD-10-CM | POA: Insufficient documentation

## 2013-04-13 ENCOUNTER — Other Ambulatory Visit: Payer: BC Managed Care – PPO

## 2013-04-13 ENCOUNTER — Ambulatory Visit (HOSPITAL_COMMUNITY): Admission: RE | Admit: 2013-04-13 | Payer: BC Managed Care – PPO | Source: Ambulatory Visit

## 2013-04-21 ENCOUNTER — Ambulatory Visit: Payer: BC Managed Care – PPO | Admitting: Obstetrics

## 2013-05-10 ENCOUNTER — Ambulatory Visit: Payer: BC Managed Care – PPO

## 2013-05-17 ENCOUNTER — Encounter: Payer: Self-pay | Admitting: Obstetrics

## 2013-10-19 ENCOUNTER — Emergency Department (HOSPITAL_COMMUNITY)
Admission: EM | Admit: 2013-10-19 | Discharge: 2013-10-19 | Disposition: A | Discharge status: Home / Self Care | Payer: BC Managed Care – PPO | Attending: Emergency Medicine | Admitting: Emergency Medicine

## 2013-10-19 ENCOUNTER — Encounter (HOSPITAL_COMMUNITY): Payer: Self-pay | Admitting: Emergency Medicine

## 2013-10-19 DIAGNOSIS — R112 Nausea with vomiting, unspecified: Secondary | ICD-10-CM | POA: Insufficient documentation

## 2013-10-19 DIAGNOSIS — R1084 Generalized abdominal pain: Secondary | ICD-10-CM | POA: Insufficient documentation

## 2013-10-19 DIAGNOSIS — R197 Diarrhea, unspecified: Secondary | ICD-10-CM | POA: Insufficient documentation

## 2013-10-19 DIAGNOSIS — Z3202 Encounter for pregnancy test, result negative: Secondary | ICD-10-CM | POA: Insufficient documentation

## 2013-10-19 DIAGNOSIS — R109 Unspecified abdominal pain: Secondary | ICD-10-CM

## 2013-10-19 DIAGNOSIS — Z79899 Other long term (current) drug therapy: Secondary | ICD-10-CM | POA: Insufficient documentation

## 2013-10-19 LAB — URINALYSIS, ROUTINE W REFLEX MICROSCOPIC
Bilirubin Urine: NEGATIVE
Glucose, UA: NEGATIVE mg/dL
Hgb urine dipstick: NEGATIVE
Ketones, ur: NEGATIVE mg/dL
Leukocytes, UA: NEGATIVE
Nitrite: NEGATIVE
Protein, ur: 30 mg/dL — AB
Specific Gravity, Urine: 1.027 (ref 1.005–1.030)
Urobilinogen, UA: 0.2 mg/dL (ref 0.0–1.0)
pH: 6 (ref 5.0–8.0)

## 2013-10-19 LAB — URINE MICROSCOPIC-ADD ON

## 2013-10-19 LAB — CBC WITH DIFFERENTIAL/PLATELET
Basophils Absolute: 0 K/uL (ref 0.0–0.1)
Basophils Relative: 0 % (ref 0–1)
Eosinophils Absolute: 0.3 K/uL (ref 0.0–0.7)
Eosinophils Relative: 4 % (ref 0–5)
HCT: 42.1 % (ref 36.0–46.0)
Hemoglobin: 14.7 g/dL (ref 12.0–15.0)
Lymphocytes Relative: 19 % (ref 12–46)
Lymphs Abs: 1.7 K/uL (ref 0.7–4.0)
MCH: 30.4 pg (ref 26.0–34.0)
MCHC: 34.9 g/dL (ref 30.0–36.0)
MCV: 87 fL (ref 78.0–100.0)
Monocytes Absolute: 0.9 K/uL (ref 0.1–1.0)
Monocytes Relative: 10 % (ref 3–12)
Neutro Abs: 5.9 K/uL (ref 1.7–7.7)
Neutrophils Relative %: 68 % (ref 43–77)
Platelets: 314 K/uL (ref 150–400)
RBC: 4.84 MIL/uL (ref 3.87–5.11)
RDW: 13 % (ref 11.5–15.5)
WBC: 8.7 K/uL (ref 4.0–10.5)

## 2013-10-19 LAB — COMPREHENSIVE METABOLIC PANEL WITH GFR
ALT: 18 U/L (ref 0–35)
AST: 17 U/L (ref 0–37)
Albumin: 3.3 g/dL — ABNORMAL LOW (ref 3.5–5.2)
Alkaline Phosphatase: 80 U/L (ref 39–117)
BUN: 11 mg/dL (ref 6–23)
CO2: 23 meq/L (ref 19–32)
Calcium: 8.9 mg/dL (ref 8.4–10.5)
Chloride: 100 meq/L (ref 96–112)
Creatinine, Ser: 0.78 mg/dL (ref 0.50–1.10)
GFR calc Af Amer: >90 mL/min
GFR calc non Af Amer: >90 mL/min
Glucose, Bld: 89 mg/dL (ref 70–99)
Potassium: 3.6 meq/L (ref 3.5–5.1)
Sodium: 133 meq/L — ABNORMAL LOW (ref 135–145)
Total Bilirubin: 0.3 mg/dL (ref 0.3–1.2)
Total Protein: 7.5 g/dL (ref 6.0–8.3)

## 2013-10-19 MED ORDER — ONDANSETRON HCL 4 MG PO TABS: 4.0000 mg | ORAL_TABLET | Freq: Four times a day (QID) | ORAL | Status: DC

## 2013-10-19 MED ORDER — DICYCLOMINE HCL 10 MG PO CAPS
10.0000 mg | ORAL_CAPSULE | Freq: Once | ORAL | Status: AC
  Administered 2013-10-19: 10 mg via ORAL
  Filled 2013-10-19: qty 1

## 2013-10-19 MED ORDER — LOPERAMIDE HCL 2 MG PO CAPS: 2.0000 mg | ORAL_CAPSULE | Freq: Four times a day (QID) | ORAL | Status: DC | PRN

## 2013-10-19 MED ORDER — ONDANSETRON HCL 4 MG/2ML IJ SOLN
4.0000 mg | Freq: Once | INTRAMUSCULAR | Status: AC
  Administered 2013-10-19: 4 mg via INTRAVENOUS
  Filled 2013-10-19: qty 2

## 2013-10-19 NOTE — ED Provider Notes (Signed)
CSN: [id number]     Arrival date & time 10/19/13  1255 History   First MD Initiated Contact with Patient 10/19/13 1612     Chief Complaint  Patient presents with  . Nausea  . Diarrhea  . Abdominal Pain   (Consider location/radiation/quality/duration/timing/severity/associated sxs/prior Treatment) HPI Pt is a 27yo female with no significant PMH c/o n/v/d that started yesterday when she got home from a Christmas party.  Does not recall anything specific she ate that may have made her sick.  Reports intermittent generalized abdominal pain and cramping, 10/10 at worst. Reports 1 episode of NBNB emesis yesterday and 2 today. Reports 4 episodes of watery dark diarrhea over the 3 hours she has been in the ED.  Denies fever. Denies sick contacts or recent travel. Denies hx of abdominal surgeries. Denies urinary or vaginal symptoms.  Past Medical History  Diagnosis Date  . No pertinent past medical history    Past Surgical History  Procedure Laterality Date  . No past surgeries     Family History  Problem Relation Age of Onset  . Diabetes Father   . Hypertension Father   . Anesthesia problems Neg Hx   . Kidney failure Father    History  Substance Use Topics  . Smoking status: Never Smoker   . Smokeless tobacco: Never Used  . Alcohol Use: No     Comment: occasionally, not with preg   OB History   Grav Para Term Preterm Abortions TAB SAB Ect Mult Living   3 2 2  0 1 0 1 0 0 2     Review of Systems  Constitutional: Negative for fever, chills and fatigue.  Respiratory: Negative for shortness of breath.   Cardiovascular: Negative for chest pain.  Gastrointestinal: Positive for nausea, vomiting, abdominal pain ( diffuse cramping) and diarrhea. Negative for constipation and blood in stool.  Musculoskeletal: Negative for back pain.  All other systems reviewed and are negative.    Allergies  Review of patient's allergies indicates no known allergies.  Home Medications   Current  Outpatient Rx  Name  Route  Sig  Dispense  Refill  . Calcium Carbonate (CALCIUM 500 PO)   Oral   Take 1 tablet by mouth daily.         . cetirizine (ZYRTEC) 10 MG tablet   Oral   Take 10 mg by mouth daily.         . Cholecalciferol (VITAMIN D PO)   Oral   Take 1 tablet by mouth daily. Pt takes OTC; does not know strength         . levonorgestrel-ethinyl estradiol (NORDETTE) 0.15-30 MG-MCG tablet   Oral   Take 1 tablet by mouth daily.   1 Package   11   . Prenatal Vit-Fe Fumarate-FA (PRENATAL MULTIVITAMIN) TABS   Oral   Take 1 tablet by mouth daily.         Marland Kitchen loperamide (IMODIUM) 2 MG capsule   Oral   Take 1 capsule (2 mg total) by mouth 4 (four) times daily as needed for diarrhea or loose stools.   12 capsule   0   . ondansetron (ZOFRAN) 4 MG tablet   Oral   Take 1 tablet (4 mg total) by mouth every 6 (six) hours.   12 tablet   0    BP 142/83  Pulse 87  Temp(Src) 98.8 F (37.1 C) (Oral)  Resp 18  Ht 5\' 5"  (1.651 m)  Wt 268 lb (121.564 kg)  BMI 44.60 kg/m2  SpO2 99%  LMP 09/17/2013 Physical Exam  Nursing note and vitals reviewed. Constitutional: She appears well-developed and well-nourished. No distress.  Pt lying comfortably in exam bed, NAD.  HENT:  Head: Normocephalic and atraumatic.  Eyes: Conjunctivae are normal. No scleral icterus.  Neck: Normal range of motion.  Cardiovascular: Normal rate, regular rhythm and normal heart sounds.   Pulmonary/Chest: Effort normal and breath sounds normal. No respiratory distress. She has no wheezes. She has no rales. She exhibits no tenderness.  Abdominal: Soft. Bowel sounds are normal. She exhibits no distension and no mass. There is tenderness. There is no rebound and no guarding.  Morbidly obese abdomen, soft, mild diffuse tenderness  Musculoskeletal: Normal range of motion.  Neurological: She is alert.  Skin: Skin is warm and dry. She is not diaphoretic.    ED Course  Procedures (including critical  care time) Labs Review Labs Reviewed  COMPREHENSIVE METABOLIC PANEL - Abnormal; Notable for the following:    Sodium 133 (*)    Albumin 3.3 (*)    All other components within normal limits  URINALYSIS, ROUTINE W REFLEX MICROSCOPIC - Abnormal; Notable for the following:    Protein, ur 30 (*)    All other components within normal limits  URINE MICROSCOPIC-ADD ON - Abnormal; Notable for the following:    Squamous Epithelial / LPF FEW (*)    All other components within normal limits  CBC WITH DIFFERENTIAL  OCCULT BLOOD X 1 CARD TO LAB, STOOL  POCT PREGNANCY, URINE   Imaging Review No results found.  EKG Interpretation   None       MDM   1. Nausea vomiting and diarrhea   2. Abdominal pain    Pt c/o n/v/d that started yesterday after Christmas party. Pt appears well, non-toxic. Abd: obese, soft, mild generalized tenderness.   CBC, CMP, UA: unremarkable. Urine preg: negative. Hemoccult: negative.  Not concerned for surgical abdomen.  Symptoms likely due to gastroenteritis.  Tx in ED: fluids, zofran, bentyl.  Rx: zofran and imodium. All labs/imaging/findings discussed with patient. All questions answered and concerns addressed. Will discharge pt home and have pt f/u with [facility] info provided. Return precautions given. Pt verbalized understanding and agreement with tx plan. Vitals: unremarkable. Discharged in stable condition.        Junius Finner, PA-C 10/19/13 2044  Junius Finner, PA-C 10/19/13 2044

## 2013-10-19 NOTE — ED Notes (Signed)
Pt states she began having N/V/D and abdominal pain yesterday.

## 2013-10-22 NOTE — ED Provider Notes (Signed)
Medical screening examination/treatment/procedure(s) were performed by non-physician practitioner and as supervising physician I was immediately available for consultation/collaboration.  Eliberto Sole T Janny Crute, MD 10/22/13 1622 

## 2013-12-09 ENCOUNTER — Inpatient Hospital Stay (HOSPITAL_COMMUNITY)
Admission: AD | Admit: 2013-12-09 | Discharge: 2013-12-09 | Disposition: A | Discharge status: Home / Self Care | Payer: BC Managed Care – PPO | Source: Ambulatory Visit | Attending: Obstetrics & Gynecology | Admitting: Obstetrics & Gynecology

## 2013-12-09 ENCOUNTER — Inpatient Hospital Stay (HOSPITAL_COMMUNITY): Payer: BC Managed Care – PPO

## 2013-12-09 ENCOUNTER — Encounter (HOSPITAL_COMMUNITY): Payer: Self-pay | Admitting: *Deleted

## 2013-12-09 DIAGNOSIS — N920 Excessive and frequent menstruation with regular cycle: Secondary | ICD-10-CM | POA: Insufficient documentation

## 2013-12-09 DIAGNOSIS — E669 Obesity, unspecified: Secondary | ICD-10-CM | POA: Diagnosis present

## 2013-12-09 DIAGNOSIS — N949 Unspecified condition associated with female genital organs and menstrual cycle: Secondary | ICD-10-CM | POA: Insufficient documentation

## 2013-12-09 DIAGNOSIS — N939 Abnormal uterine and vaginal bleeding, unspecified: Secondary | ICD-10-CM

## 2013-12-09 DIAGNOSIS — N898 Other specified noninflammatory disorders of vagina: Secondary | ICD-10-CM

## 2013-12-09 DIAGNOSIS — N946 Dysmenorrhea, unspecified: Secondary | ICD-10-CM | POA: Insufficient documentation

## 2013-12-09 DIAGNOSIS — N938 Other specified abnormal uterine and vaginal bleeding: Secondary | ICD-10-CM | POA: Insufficient documentation

## 2013-12-09 LAB — CBC
HCT: 37.1 % (ref 36.0–46.0)
Hemoglobin: 12.6 g/dL (ref 12.0–15.0)
MCH: 29 pg (ref 26.0–34.0)
MCHC: 34 g/dL (ref 30.0–36.0)
MCV: 85.5 fL (ref 78.0–100.0)
PLATELETS: 305 10*3/uL (ref 150–400)
RBC: 4.34 MIL/uL (ref 3.87–5.11)
RDW: 13.1 % (ref 11.5–15.5)
WBC: 8.3 10*3/uL (ref 4.0–10.5)

## 2013-12-09 LAB — URINALYSIS, ROUTINE W REFLEX MICROSCOPIC
Bilirubin Urine: NEGATIVE
Glucose, UA: NEGATIVE mg/dL
Ketones, ur: 15 mg/dL — AB
Leukocytes, UA: NEGATIVE
NITRITE: NEGATIVE
PROTEIN: NEGATIVE mg/dL
Specific Gravity, Urine: 1.025 (ref 1.005–1.030)
UROBILINOGEN UA: 0.2 mg/dL (ref 0.0–1.0)
pH: 6 (ref 5.0–8.0)

## 2013-12-09 LAB — URINE MICROSCOPIC-ADD ON

## 2013-12-09 LAB — WET PREP, GENITAL
TRICH WET PREP: NONE SEEN
Yeast Wet Prep HPF POC: NONE SEEN

## 2013-12-09 MED ORDER — NORGESTIMATE-ETH ESTRADIOL 0.25-35 MG-MCG PO TABS: 1.0000 | ORAL_TABLET | Freq: Every day | ORAL | Status: DC

## 2013-12-09 MED ORDER — KETOROLAC TROMETHAMINE 60 MG/2ML IM SOLN
60.0000 mg | Freq: Once | INTRAMUSCULAR | Status: AC
  Administered 2013-12-09: 60 mg via INTRAMUSCULAR
  Filled 2013-12-09: qty 2

## 2013-12-09 NOTE — MAU Note (Signed)
Pt had a lot of irregular bleeding with Dep[facility] and then [facility];

## 2013-12-09 NOTE — MAU Provider Note (Signed)
History     CSN: [id number]  Arrival date and time: 12/09/13 1347   First Provider Initiated Contact with Patient 12/09/13 1418      Chief Complaint  Patient presents with  . Vaginal Bleeding  . Dysmenorrhea   HPI Comments: Amanda Melton 28 y.o. [id number] presents to MAU today for irregular vaginal bleeding. After her delivery she was put on Depo Provera for 3 rounds, then switched over to [facility] in June 2014. She did well for a few months then started bleeding every other week and now she is bleeding daily on the BCP. She was scheduled for pelvic ultrasound in June and did not keep that appointment. She has a history of Iron deficiency anemia. She feels tired now, though she works full time and has 2 children. She denies any change in partners.   Vaginal Bleeding      Past Medical History  Diagnosis Date  . No pertinent past medical history   . Medical history non-contributory     Past Surgical History  Procedure Laterality Date  . No past surgeries      Family History  Problem Relation Age of Onset  . Diabetes Father   . Hypertension Father   . Anesthesia problems Neg Hx   . Kidney failure Father     History  Substance Use Topics  . Smoking status: Never Smoker   . Smokeless tobacco: Never Used  . Alcohol Use: No     Comment: occasionally, not with preg    Allergies: No Known Allergies  Facility-administered medications prior to admission  Medication Dose Route Frequency Provider Last Rate Last Dose  . medroxyPROGESTERone (DEPO-PROVERA) injection 150 mg  150 mg Intramuscular Q90 days Brock Bad, MD   150 mg at 02/16/13 1645   Prescriptions prior to admission  Medication Sig Dispense Refill  . Calcium Carbonate (CALCIUM 500 PO) Take 1 tablet by mouth daily.      . cetirizine (ZYRTEC) 10 MG tablet Take 10 mg by mouth daily.      . Cholecalciferol (VITAMIN D PO) Take 1 tablet by mouth daily. Pt takes OTC; does not know strength      .  levonorgestrel-ethinyl estradiol (NORDETTE) 0.15-30 MG-MCG tablet Take 1 tablet by mouth daily.  1 Package  11  . loperamide (IMODIUM) 2 MG capsule Take 1 capsule (2 mg total) by mouth 4 (four) times daily as needed for diarrhea or loose stools.  12 capsule  0  . ondansetron (ZOFRAN) 4 MG tablet Take 1 tablet (4 mg total) by mouth every 6 (six) hours.  12 tablet  0  . Prenatal Vit-Fe Fumarate-FA (PRENATAL MULTIVITAMIN) TABS Take 1 tablet by mouth daily.        Review of Systems  Constitutional: Positive for malaise/fatigue.  HENT: Negative.   Eyes: Negative.   Respiratory: Negative.   Cardiovascular: Negative.   Gastrointestinal: Negative.   Genitourinary: Negative.        Vaginal bleeding  Musculoskeletal: Negative.   Skin: Negative.   Neurological: Negative.   Endo/Heme/Allergies: Negative.   Psychiatric/Behavioral: Negative.    Physical Exam   Blood pressure 137/76, pulse 67, temperature 97.8 F (36.6 C), temperature source Oral, resp. rate 18, height 5\' 5"  (1.651 m), weight 121.564 kg (268 lb), not currently breastfeeding.  Physical Exam  Constitutional: She is oriented to person, place, and time. She appears well-developed and well-nourished. No distress.  Obese  HENT:  Head: Normocephalic and atraumatic.  Eyes: Pupils are equal, round, and  reactive to light.  Neck: Normal range of motion.  Cardiovascular: Normal rate, regular rhythm and normal heart sounds.   Respiratory: Effort normal and breath sounds normal.  GI: Soft. Bowel sounds are normal.  Genitourinary:  Genital: external negative Vaginal:small amount blood Cervix:negative Bimanual:negative/ somewhat difficult due to obesity   Musculoskeletal: Normal range of motion.  Neurological: She is alert and oriented to person, place, and time.  Skin: Skin is warm and dry.  Psychiatric: She has a normal mood and affect. Her behavior is normal. Judgment and thought content normal.   Results for orders placed during  the hospital encounter of 12/09/13 (from the past 24 hour(s))  URINALYSIS, ROUTINE W REFLEX MICROSCOPIC     Status: Abnormal   Collection Time    12/09/13  1:55 PM      Result Value Range   Color, Urine YELLOW  YELLOW   APPearance CLEAR  CLEAR   Specific Gravity, Urine 1.025  1.005 - 1.030   pH 6.0  5.0 - 8.0   Glucose, UA NEGATIVE  NEGATIVE mg/dL   Hgb urine dipstick LARGE (*) NEGATIVE   Bilirubin Urine NEGATIVE  NEGATIVE   Ketones, ur 15 (*) NEGATIVE mg/dL   Protein, ur NEGATIVE  NEGATIVE mg/dL   Urobilinogen, UA 0.2  0.0 - 1.0 mg/dL   Nitrite NEGATIVE  NEGATIVE   Leukocytes, UA NEGATIVE  NEGATIVE  URINE MICROSCOPIC-ADD ON     Status: Abnormal   Collection Time    12/09/13  1:55 PM      Result Value Range   Squamous Epithelial / LPF FEW (*) RARE   WBC, UA 0-2  <3 WBC/hpf   RBC / HPF 0-2  <3 RBC/hpf   Bacteria, UA FEW (*) RARE   Urine-Other MUCOUS PRESENT    CBC     Status: None   Collection Time    12/09/13  2:25 PM      Result Value Range   WBC 8.3  4.0 - 10.5 K/uL   RBC 4.34  3.87 - 5.11 MIL/uL   Hemoglobin 12.6  12.0 - 15.0 g/dL   HCT 40.937.1  81.136.0 - 91.446.0 %   MCV 85.5  78.0 - 100.0 fL   MCH 29.0  26.0 - 34.0 pg   MCHC 34.0  30.0 - 36.0 g/dL   RDW 78.213.1  95.611.5 - 21.315.5 %   Platelets 305  150 - 400 K/uL  WET PREP, GENITAL     Status: Abnormal   Collection Time    12/09/13  2:35 PM      Result Value Range   Yeast Wet Prep HPF POC NONE SEEN  NONE SEEN   Trich, Wet Prep NONE SEEN  NONE SEEN   Clue Cells Wet Prep HPF POC FEW (*) NONE SEEN   WBC, Wet Prep HPF POC FEW (*) NONE SEEN    [place] Transvaginal Non-ob  12/09/2013   CLINICAL DATA:  Irregular menses.  Menorrhagia.  Unsure of LMP.  EXAM: TRANSABDOMINAL AND TRANSVAGINAL ULTRASOUND OF PELVIS  TECHNIQUE: Both transabdominal and transvaginal ultrasound examinations of the pelvis were performed. Transabdominal technique was performed for global imaging of the pelvis including uterus, ovaries, adnexal regions, and pelvic  cul-de-sac. It was necessary to proceed with endovaginal exam following the transabdominal exam to visualize the endometrium and ovaries.  COMPARISON:  06/22/2004  FINDINGS: Uterus  Measurements: 8.3 x 3.5 x 4.8 cm. No fibroids or other mass visualized.  Endometrium  Thickness: 8 mm.  No focal abnormality visualized.  Right ovary  Measurements: 2.7 x 1.6 x 1.6 cm. Normal appearance/no adnexal mass.  Left ovary  Measurements: 2.8 x 1.6 x 1.9 cm. Normal appearance/no adnexal mass.  Other findings  No free fluid.  IMPRESSION: Negative. No pelvic mass or other significant abnormality identified.   Electronically Signed   By: Myles Rosenthal M.D.   On: 12/09/2013 16:03   US Pelvis Complete  12/09/2013   CLINICAL DATA:  Irregular menses.  Menorrhagia.  Unsure of LMP.  EXAM: TRANSABDOMINAL AND TRANSVAGINAL ULTRASOUND OF PELVIS  TECHNIQUE: Both transabdominal and transvaginal ultrasound examinations of the pelvis were performed. Transabdominal technique was performed for global imaging of the pelvis including uterus, ovaries, adnexal regions, and pelvic cul-de-sac. It was necessary to proceed with endovaginal exam following the transabdominal exam to visualize the endometrium and ovaries.  COMPARISON:  06/22/2004  FINDINGS: Uterus  Measurements: 8.3 x 3.5 x 4.8 cm. No fibroids or other mass visualized.  Endometrium  Thickness: 8 mm.  No focal abnormality visualized.  Right ovary  Measurements: 2.7 x 1.6 x 1.6 cm. Normal appearance/no adnexal mass.  Left ovary  Measurements: 2.8 x 1.6 x 1.9 cm. Normal appearance/no adnexal mass.  Other findings  No free fluid.  IMPRESSION: Negative. No pelvic mass or other significant abnormality identified.   Electronically Signed   By: Myles Rosenthal M.D.   On: 12/09/2013 16:03     MAU Course  Procedures  MDM  Toradol 60 mg IM Wet prep, GC, Chlamydia Pelvic Ultrasound  Assessment and Plan   A: Abnormal vaginal bleeding  P: Above tests done Will change BCP to Ortho Cyclen daily   Follow up with Dr Clearance Coots for possible Mirena IUD  Carolynn Serve 12/09/2013, 2:45 PM

## 2013-12-09 NOTE — MAU Note (Signed)
C/o vaginal bleeding and cramping for 3-4 weeks; [facility]; [facility] the period was fine for first 2 months but has had a lot of bleeding and cramping ever since then til now;

## 2013-12-09 NOTE — Discharge Instructions (Signed)

## 2013-12-10 LAB — GC/CHLAMYDIA PROBE AMP
CT PROBE, AMP APTIMA: NEGATIVE
GC PROBE AMP APTIMA: NEGATIVE

## 2013-12-12 LAB — POCT PREGNANCY, URINE: Preg Test, Ur: NEGATIVE

## 2014-08-07 ENCOUNTER — Emergency Department (HOSPITAL_COMMUNITY)
Admission: EM | Admit: 2014-08-07 | Discharge: 2014-08-07 | Disposition: A | Discharge status: Home / Self Care | Payer: BC Managed Care – PPO | Attending: Emergency Medicine | Admitting: Emergency Medicine

## 2014-08-07 ENCOUNTER — Encounter (HOSPITAL_COMMUNITY): Payer: Self-pay | Admitting: Emergency Medicine

## 2014-08-07 DIAGNOSIS — Z79899 Other long term (current) drug therapy: Secondary | ICD-10-CM | POA: Insufficient documentation

## 2014-08-07 DIAGNOSIS — L539 Erythematous condition, unspecified: Secondary | ICD-10-CM | POA: Insufficient documentation

## 2014-08-07 DIAGNOSIS — M79604 Pain in right leg: Secondary | ICD-10-CM | POA: Insufficient documentation

## 2014-08-07 MED ORDER — OXYCODONE-ACETAMINOPHEN 5-325 MG PO TABS: 1.0000 | ORAL_TABLET | ORAL | Status: DC | PRN

## 2014-08-07 MED ORDER — ENOXAPARIN SODIUM 60 MG/0.6ML ~~LOC~~ SOLN
40.0000 mg | Freq: Once | SUBCUTANEOUS | Status: AC
  Administered 2014-08-07: 20:00:00 40 mg via SUBCUTANEOUS
  Filled 2014-08-07: qty 0.6

## 2014-08-07 NOTE — ED Provider Notes (Signed)
CSN: [id number]     Arrival date & time 08/07/14  1701 History  This chart was scribed for non-physician practitioner working with Mirian MoMatthew Gentry, MD by Elveria Risingimelie Horne, ED Scribe. This patient was seen in room WTR5/WTR5 and the patient's care was started at 7:26 PM.   Chief Complaint  Patient presents with  . Leg Pain   The history is provided by the patient. No language interpreter was used.   HPI Comments: [name] is a 28 y.o. female who presents to the Emergency Department complaining of intermittent right leg pain and swelling for one week.  Patient states pain localized behind her knee and distal thigh.  States she has had mild swelling and redness of her posterior thigh.  Patient denies any known injury, trauma, or falls.  Pain worse when walking and fully extending her right leg.  Denies numbness, paresthesias, or weakness of right leg.  No prior hx of DVT or PE.  Patient is currently on OCP's-- orthocyclen.  No chest pain or SOB.  VS stable on arrival.  Past Medical History  Diagnosis Date  . No pertinent past medical history   . Medical history non-contributory    Past Surgical History  Procedure Laterality Date  . No past surgeries     Family History  Problem Relation Age of Onset  . Diabetes Father   . Hypertension Father   . Anesthesia problems Neg Hx   . Kidney failure Father    History  Substance Use Topics  . Smoking status: Never Smoker   . Smokeless tobacco: Never Used  . Alcohol Use: No     Comment: occasionally, not with preg   OB History   Grav Para Term Preterm Abortions TAB SAB Ect Mult Living   3 2 2  0 1 0 1 0 0 2     Review of Systems  Constitutional: Negative for fever and chills.  Cardiovascular: Positive for leg swelling. Negative for chest pain.  Musculoskeletal: Positive for arthralgias. Negative for back pain and gait problem.  Neurological: Negative for weakness and numbness.  All other systems reviewed and are negative.  Allergies   Review of patient's allergies indicates no known allergies.  Home Medications   Prior to Admission medications   Medication Sig Start Date End Date Taking? Authorizing Provider  acetaminophen (TYLENOL) 500 MG tablet Take 500 mg by mouth every 6 (six) hours as needed for moderate pain.    Yes Historical Provider, MD  Calcium Carbonate (CALCIUM 500 PO) Take 1 tablet by mouth daily.   Yes Historical Provider, MD  cetirizine (ZYRTEC) 10 MG tablet Take 10 mg by mouth daily.   Yes Historical Provider, MD  Cholecalciferol (VITAMIN D PO) Take 1 tablet by mouth daily.    Yes Historical Provider, MD  ferrous sulfate 325 (65 FE) MG tablet Take 325 mg by mouth daily with breakfast.   Yes Historical Provider, MD  Multiple Vitamin (MULTIVITAMIN WITH MINERALS) TABS tablet Take 1 tablet by mouth daily.   Yes Historical Provider, MD  norgestimate-ethinyl estradiol (ORTHO-CYCLEN, 28,) 0.25-35 MG-MCG tablet Take 1 tablet by mouth daily. 12/09/13  Yes Delbert PhenixLinda M Barefoot, NP  Acetaminophen-Caff-Pyrilamine (MIDOL COMPLETE PO) Take 2 tablets by mouth 2 (two) times daily as needed (cramps).    Historical Provider, MD   Triage Vitals: BP 150/89  Pulse 60  Temp(Src) 98.4 F (36.9 C) (Oral)  Resp 18  SpO2 100%  LMP 07/06/2014  Physical Exam  Nursing note and vitals reviewed. Constitutional: She  is oriented to person, place, and time. She appears well-developed and well-nourished.  HENT:  Head: Normocephalic and atraumatic.  Mouth/Throat: Oropharynx is clear and moist.  Eyes: Conjunctivae and EOM are normal. Pupils are equal, round, and reactive to light.  Neck: Normal range of motion.  Cardiovascular: Normal rate, regular rhythm and normal heart sounds.   Pulmonary/Chest: Effort normal and breath sounds normal. No respiratory distress. She has no wheezes.  Abdominal: Soft. Bowel sounds are normal.  Musculoskeletal: Normal range of motion.  Exam somewhat limited due to body habitus; Right knee with tenderness  along posterior aspect with extension into distal thigh and proximal calf; mild erythema noted; no appreciable asymmetry or palpable cords; pain with full flexion of right knee; no bony deformities noted; normal strength and sensation throughout RLE; DP pulse intact  Neurological: She is alert and oriented to person, place, and time.  Skin: Skin is warm and dry.  Psychiatric: She has a normal mood and affect.    ED Course  Procedures (including critical care time)  COORDINATION OF CARE: 7:39 PM- Will give patient injection of Lovenox here and have patient return for DVT study in the morning. Patient will be discharged with pain medication. Discussed treatment plan with patient at bedside and patient agreed to plan.   Labs Review Labs Reviewed - No data to display  Imaging Review No results found.   EKG Interpretation None      MDM   Final diagnoses:  Leg pain, right   28 y.o. F with intermittent right leg pain over the past week.  She notes increased pain when ambulating as well as some swelling and erythema.  Denies known injuries or falls.  On exam, patient with tenderness along right posterior knee with extension into distal thigh and proximal calf.  There is mild overlying erythema but no palpable cords.  Leg remains NVI.  Patient has no prior hx of DVT or PE, however she is currently on OCP's.  Without known injury or bony deformity on exam, do not feel x-rays will change management however do have some concern for DVT.  No chest pain or SOB  to suggest PE, VS stable on RA.  Vascular ultrasound not available at this time-- patient give dose of lovenox and scheduled for OP doppler of RLE tomorrow morning.  Percocet for pain control.  Discussed plan with patient, he/she acknowledged understanding and agreed with plan of care.  Return precautions given for new or worsening symptoms.  I personally performed the services described in this documentation, which was scribed in my presence.  The recorded information has been reviewed and is accurate.  Garlon Hatchet, PA-C 08/07/14 2032

## 2014-08-07 NOTE — Discharge Instructions (Signed)
Take the prescribed medication as directed for pain. Follow-up tomorrow for ultrasound of your leg-- go to radiology dept here.  You do not need to check into the ER again. Return to the ED for new or worsening symptoms.

## 2014-08-07 NOTE — ED Notes (Signed)
Pt c/o right leg pain that has been off and on x 1 week. Denies injury , SOB or hx of blood clot.

## 2014-08-08 ENCOUNTER — Ambulatory Visit (HOSPITAL_COMMUNITY)
Admission: RE | Admit: 2014-08-08 | Discharge: 2014-08-08 | Disposition: A | Discharge status: Home / Self Care | Payer: BC Managed Care – PPO | Source: Ambulatory Visit | Attending: Emergency Medicine | Admitting: Emergency Medicine

## 2014-08-08 DIAGNOSIS — M79604 Pain in right leg: Secondary | ICD-10-CM | POA: Insufficient documentation

## 2014-08-08 DIAGNOSIS — M79609 Pain in unspecified limb: Secondary | ICD-10-CM

## 2014-08-08 NOTE — Progress Notes (Signed)
*  Preliminary Results* Right lower extremity venous duplex completed. Right lower extremity is negative for deep vein thrombosis. There is no evidence of right Baker's cyst.  08/08/2014 3:13 PM  Gertie FeyMichelle Ranon Coven, RVT, RDCS, RDMS

## 2014-08-10 NOTE — ED Provider Notes (Signed)
Medical screening examination/treatment/procedure(s) were performed by non-physician practitioner and as supervising physician I was immediately available for consultation/collaboration.   EKG Interpretation None        Matthew Gentry, MD 08/10/14 0717 

## 2014-09-04 ENCOUNTER — Encounter (HOSPITAL_COMMUNITY): Payer: Self-pay | Admitting: Emergency Medicine

## 2014-12-18 ENCOUNTER — Ambulatory Visit (INDEPENDENT_AMBULATORY_CARE_PROVIDER_SITE_OTHER): Payer: BC Managed Care – PPO | Admitting: Certified Nurse Midwife

## 2014-12-18 ENCOUNTER — Encounter: Payer: Self-pay | Admitting: Certified Nurse Midwife

## 2014-12-18 VITALS — BP 118/76 | HR 80 | Resp 20 | Ht 65.75 in | Wt 248.0 lb

## 2014-12-18 DIAGNOSIS — Z0000 Encounter for general adult medical examination without abnormal findings: Secondary | ICD-10-CM

## 2014-12-18 DIAGNOSIS — Z01419 Encounter for gynecological examination (general) (routine) without abnormal findings: Secondary | ICD-10-CM

## 2014-12-18 DIAGNOSIS — Z124 Encounter for screening for malignant neoplasm of cervix: Secondary | ICD-10-CM

## 2014-12-18 DIAGNOSIS — N912 Amenorrhea, unspecified: Secondary | ICD-10-CM

## 2014-12-18 DIAGNOSIS — Z30011 Encounter for initial prescription of contraceptive pills: Secondary | ICD-10-CM

## 2014-12-18 LAB — POCT URINALYSIS DIPSTICK
Bilirubin, UA: NEGATIVE
Blood, UA: NEGATIVE
Glucose, UA: NEGATIVE
KETONES UA: NEGATIVE
Leukocytes, UA: NEGATIVE
Nitrite, UA: NEGATIVE
Protein, UA: NEGATIVE
UROBILINOGEN UA: NEGATIVE
pH, UA: 5

## 2014-12-18 LAB — POCT URINE PREGNANCY: PREG TEST UR: NEGATIVE

## 2014-12-18 MED ORDER — NORGESTIMATE-ETH ESTRADIOL 0.25-35 MG-MCG PO TABS: 1.0000 | ORAL_TABLET | Freq: Every day | ORAL | Status: DC

## 2014-12-18 NOTE — Patient Instructions (Signed)
General topics  Next pap or exam is  due in 1 year Take a Women's multivitamin Take 1200 mg. of calcium daily - prefer dietary If any concerns in interim to call back  Breast Self-Awareness Practicing breast self-awareness may pick up problems early, prevent significant medical complications, and possibly save your life. By practicing breast self-awareness, you can become familiar with how your breasts look and feel and if your breasts are changing. This allows you to notice changes early. It can also offer you some reassurance that your breast health is good. One way to learn what is normal for your breasts and whether your breasts are changing is to do a breast self-exam. If you find a lump or something that was not present in the past, it is best to contact your caregiver right away. Other findings that should be evaluated by your caregiver include nipple discharge, especially if it is bloody; skin changes or reddening; areas where the skin seems to be pulled in (retracted); or new lumps and bumps. Breast pain is seldom associated with cancer (malignancy), but should also be evaluated by a caregiver. BREAST SELF-EXAM The best time to examine your breasts is 5 7 days after your menstrual period is over.  ExitCare Patient Information 2013 [employer].   Exercise to Stay Healthy Exercise helps you become and stay healthy. EXERCISE IDEAS AND TIPS Choose exercises that:  You enjoy.  Fit into your day. You do not need to exercise really hard to be healthy. You can do exercises at a slow or medium level and stay healthy. You can:  Stretch before and after working out.  Try yoga, Pilates, or tai chi.  Lift weights.  Walk fast, swim, jog, run, climb stairs, bicycle, dance, or rollerskate.  Take aerobic classes. Exercises that burn about 150 calories:  Running 1  miles in 15 minutes.  Playing volleyball for 45 to 60 minutes.  Washing and waxing a car for 45 to 60  minutes.  Playing touch football for 45 minutes.  Walking 1  miles in 35 minutes.  Pushing a stroller 1  miles in 30 minutes.  Playing basketball for 30 minutes.  Raking leaves for 30 minutes.  Bicycling 5 miles in 30 minutes.  Walking 2 miles in 30 minutes.  Dancing for 30 minutes.  Shoveling snow for 15 minutes.  Swimming laps for 20 minutes.  Walking up stairs for 15 minutes.  Bicycling 4 miles in 15 minutes.  Gardening for 30 to 45 minutes.  Jumping rope for 15 minutes.  Washing windows or floors for 45 to 60 minutes. Document Released: 11/22/2010 Document Revised: 01/12/2012 Document Reviewed: 11/22/2010 ExitCare Patient Information 2013 [employer].   Other topics ( that may be useful information):    Sexually Transmitted Disease Sexually transmitted disease (STD) refers to any infection that is passed from person to person during sexual activity. This may happen by way of saliva, semen, blood, vaginal mucus, or urine. Common STDs include:  Gonorrhea.  Chlamydia.  Syphilis.  HIV/AIDS.  Genital herpes.  Hepatitis B and C.  Trichomonas.  Human papillomavirus (HPV).  Pubic lice. CAUSES  An STD may be spread by bacteria, virus, or parasite. A person can get an STD by:  Sexual intercourse with an infected person.  Sharing sex toys with an infected person.  Sharing needles with an infected person.  Having intimate contact with the genitals, mouth, or rectal areas of an infected person. SYMPTOMS  Some people may not have any symptoms, but   they can still pass the infection to others. Different STDs have different symptoms. Symptoms include:  Painful or bloody urination.  Pain in the pelvis, abdomen, vagina, anus, throat, or eyes.  Skin rash, itching, irritation, growths, or sores (lesions). These usually occur in the genital or anal area.  Abnormal vaginal discharge.  Penile discharge in men.  Soft, flesh-colored skin growths in the  genital or anal area.  Fever.  Pain or bleeding during sexual intercourse.  Swollen glands in the groin area.  Yellow skin and eyes (jaundice). This is seen with hepatitis. DIAGNOSIS  To make a diagnosis, your caregiver may:  Take a medical history.  Perform a physical exam.  Take a specimen (culture) to be examined.  Examine a sample of discharge under a microscope.  Perform blood test TREATMENT   Chlamydia, gonorrhea, trichomonas, and syphilis can be cured with antibiotic medicine.  Genital herpes, hepatitis, and HIV can be treated, but not cured, with prescribed medicines. The medicines will lessen the symptoms.  Genital warts from HPV can be treated with medicine or by freezing, burning (electrocautery), or surgery. Warts may come back.  HPV is a virus and cannot be cured with medicine or surgery.However, abnormal areas may be followed very closely by your caregiver and may be removed from the cervix, vagina, or vulva through office procedures or surgery. If your diagnosis is confirmed, your recent sexual partners need treatment. This is true even if they are symptom-free or have a negative culture or evaluation. They should not have sex until their caregiver says it is okay. HOME CARE INSTRUCTIONS  All sexual partners should be informed, tested, and treated for all STDs.  Take your antibiotics as directed. Finish them even if you start to feel better.  Only take over-the-counter or prescription medicines for pain, discomfort, or fever as directed by your caregiver.  Rest.  Eat a balanced diet and drink enough fluids to keep your urine clear or pale yellow.  Do not have sex until treatment is completed and you have followed up with your caregiver. STDs should be checked after treatment.  Keep all follow-up appointments, Pap tests, and blood tests as directed by your caregiver.  Only use latex condoms and water-soluble lubricants during sexual activity. Do not use  petroleum jelly or oils.  Avoid alcohol and illegal drugs.  Get vaccinated for HPV and hepatitis. If you have not received these vaccines in the past, talk to your caregiver about whether one or both might be right for you.  Avoid risky sex practices that can break the skin. The only way to avoid getting an STD is to avoid all sexual activity.Latex condoms and dental dams (for oral sex) will help lessen the risk of getting an STD, but will not completely eliminate the risk. SEEK MEDICAL CARE IF:   You have a fever.  You have any new or worsening symptoms. Document Released: 01/10/2003 Document Revised: 01/12/2012 Document Reviewed: 01/17/2011 [facility] Patient Information 2013 [employer].    Domestic Abuse You are being battered or abused if someone close to you hits, pushes, or physically hurts you in any way. You also are being abused if you are forced into activities. You are being sexually abused if you are forced to have sexual contact of any kind. You are being emotionally abused if you are made to feel worthless or if you are constantly threatened. It is important to remember that help is available. No one has the right to abuse you. PREVENTION OF FURTHER  ABUSE  Learn the warning signs of danger. This varies with situations but may include: the use of alcohol, threats, isolation from friends and family, or forced sexual contact. Leave if you feel that violence is going to occur.  If you are attacked or beaten, report it to the police so the abuse is documented. You do not have to press charges. The police can protect you while you or the attackers are leaving. Get the officer's name and badge number and a copy of the report.  Find someone you can trust and tell them what is happening to you: your caregiver, a nurse, clergy member, close friend or family member. Feeling ashamed is natural, but remember that you have done nothing wrong. No one deserves abuse. Document Released:  10/17/2000 Document Revised: 01/12/2012 Document Reviewed: 12/26/2010 ExitCare Patient Information 2013 [employer].    How Much is Too Much Alcohol? Drinking too much alcohol can cause injury, accidents, and health problems. These types of problems can include:   Car crashes.  Falls.  Family fighting (domestic violence).  Drowning.  Fights.  Injuries.  Burns.  Damage to certain organs.  Having a baby with birth defects. ONE DRINK CAN BE TOO MUCH WHEN YOU ARE:  Working.  Pregnant or breastfeeding.  Taking medicines. Ask your doctor.  Driving or planning to drive. If you or someone you know has a drinking problem, get help from a doctor.  Document Released: 08/16/2009 Document Revised: 01/12/2012 Document Reviewed: 08/16/2009 ExitCare Patient Information 2013 [employer].   Smoking Hazards Smoking cigarettes is extremely bad for your health. Tobacco smoke has over 200 known poisons in it. There are over 60 chemicals in tobacco smoke that cause cancer. Some of the chemicals found in cigarette smoke include:   Cyanide.  Benzene.  Formaldehyde.  Methanol (wood alcohol).  Acetylene (fuel used in welding torches).  Ammonia. Cigarette smoke also contains the poisonous gases nitrogen oxide and carbon monoxide.  Cigarette smokers have an increased risk of many serious medical problems and Smoking causes approximately:  90% of all lung cancer deaths in men.  80% of all lung cancer deaths in women.  90% of deaths from chronic obstructive lung disease. Compared with nonsmokers, smoking increases the risk of:  Coronary heart disease by 2 to 4 times.  Stroke by 2 to 4 times.  Men developing lung cancer by 23 times.  Women developing lung cancer by 13 times.  Dying from chronic obstructive lung diseases by 12 times.  . Smoking is the most preventable cause of death and disease in our society.  WHY IS SMOKING ADDICTIVE?  Nicotine is the chemical  agent in tobacco that is capable of causing addiction or dependence.  When you smoke and inhale, nicotine is absorbed rapidly into the bloodstream through your lungs. Nicotine absorbed through the lungs is capable of creating a powerful addiction. Both inhaled and non-inhaled nicotine may be addictive.  Addiction studies of cigarettes and spit tobacco show that addiction to nicotine occurs mainly during the teen years, when young people begin using tobacco products. WHAT ARE THE BENEFITS OF QUITTING?  There are many health benefits to quitting smoking.   Likelihood of developing cancer and heart disease decreases. Health improvements are seen almost immediately.  Blood pressure, pulse rate, and breathing patterns start returning to normal soon after quitting. QUITTING SMOKING   American Lung Association - 1-800-LUNGUSA  American Cancer Society - 1-800-ACS-2345 Document Released: 11/27/2004 Document Revised: 01/12/2012 Document Reviewed: 08/01/2009 ExitCare Patient Information 2013 ExitCare[employer]   LL[employer].   Stress Management Stress is a state of physical or mental tension that often results from changes in your life or normal routine. Some common causes of stress are:  Death of a loved one.  Injuries or severe illnesses.  Getting fired or changing jobs.  Moving into a new home. Other causes may be:  Sexual problems.  Business or financial losses.  Taking on a large debt.  Regular conflict with someone at home or at work.  Constant tiredness from lack of sleep. It is not just bad things that are stressful. It may be stressful to:  Win the lottery.  Get married.  Buy a new car. The amount of stress that can be easily tolerated varies from person to person. Changes generally cause stress, regardless of the types of change. Too much stress can affect your health. It may lead to physical or emotional problems. Too little stress (boredom) may also become stressful. SUGGESTIONS TO  REDUCE STRESS:  Talk things over with your family and friends. It often is helpful to share your concerns and worries. If you feel your problem is serious, you may want to get help from a professional counselor.  Consider your problems one at a time instead of lumping them all together. Trying to take care of everything at once may seem impossible. List all the things you need to do and then start with the most important one. Set a goal to accomplish 2 or 3 things each day. If you expect to do too many in a single day you will naturally fail, causing you to feel even more stressed.  Do not use alcohol or drugs to relieve stress. Although you may feel better for a short time, they do not remove the problems that caused the stress. They can also be habit forming.  Exercise regularly - at least 3 times per week. Physical exercise can help to relieve that "uptight" feeling and will relax you.  The shortest distance between despair and hope is often a good night's sleep.  Go to bed and get up on time allowing yourself time for appointments without being rushed.  Take a short "time-out" period from any stressful situation that occurs during the day. Close your eyes and take some deep breaths. Starting with the muscles in your face, tense them, hold it for a few seconds, then relax. Repeat this with the muscles in your neck, shoulders, hand, stomach, back and legs.  Take good care of yourself. Eat a balanced diet and get plenty of rest.  Schedule time for having fun. Take a break from your daily routine to relax. HOME CARE INSTRUCTIONS   Call if you feel overwhelmed by your problems and feel you can no longer manage them on your own.  Return immediately if you feel like hurting yourself or someone else. Document Released: 04/15/2001 Document Revised: 01/12/2012 Document Reviewed: 12/06/2007 ExitCare Patient Information 2013 [employer]Care[employer].   

## 2014-12-18 NOTE — Progress Notes (Signed)
29 y.o. [id number] Single  African American Fe here to establish gyn care and for annual exam.  Periods normal, no issues. Contraception previous Depo Provera use with bleeding, Orthocyclen  working well. Some amenorrhea in past with OCP, no missed pills ever.Previous OB/Gyn care with [facility]. Desires STD screening , no concerns.  Sees urgent care if needed. Working mother of 2 boys!  Patient's last menstrual period was 11/13/2014.          Sexually active: Yes.    The current method of family planning is OCP (estrogen/progesterone).    Exercising: No.  exercise Smoker:  no  Health Maintenance: Pap:  2015 neg per patient No abnormal paps per patient MMG:  none Colonoscopy:  none BMD:   none TDaP:  UTD Labs: Poct urine-neg, Hgb-13.1, Upt-neg Self breast exam: not done   reports that she has never smoked. She has never used smokeless tobacco. She reports that she drinks about 1.8 oz of alcohol per week. She reports that she does not use illicit drugs.  Past Medical History  Diagnosis Date  . No pertinent past medical history   . Medical history non-contributory   . Asthma     Past Surgical History  Procedure Laterality Date  . No past surgeries      Current Outpatient Prescriptions  Medication Sig Dispense Refill  . Calcium Carbonate (CALCIUM 500 PO) Take 1 tablet by mouth daily.    . cetirizine (ZYRTEC) 10 MG tablet Take 10 mg by mouth daily.    . Cholecalciferol (VITAMIN D PO) Take 1 tablet by mouth daily.     . ferrous sulfate 325 (65 FE) MG tablet Take 325 mg by mouth daily with breakfast.    . Multiple Vitamin (MULTIVITAMIN WITH MINERALS) TABS tablet Take 1 tablet by mouth daily.    . norgestimate-ethinyl estradiol (ORTHO-CYCLEN, 28,) 0.25-35 MG-MCG tablet Take 1 tablet by mouth daily. 1 Package 11   No current facility-administered medications for this visit.    Family History  Problem Relation Age of Onset  . Diabetes Father   . Hypertension Father   . Anesthesia  problems Neg Hx   . Kidney failure Father     ROS:  Pertinent items are noted in HPI.  Otherwise, a comprehensive ROS was negative.  Exam:   BP 118/76 mmHg  Pulse 80  Resp 20  Ht 5' 5.75" (1.67 m)  Wt 248 lb (112.492 kg)  BMI 40.34 kg/m2  LMP 11/13/2014 Height: 5' 5.75" (167 cm) Ht Readings from Last 3 Encounters:  12/18/14 5' 5.75" (1.67 m)  12/09/13  (1.651 m)  10/19/13  (1.651 m)    General appearance: alert, cooperative and appears stated age Head: Normocephalic, without obvious abnormality, atraumatic Neck: no adenopathy, supple, symmetrical, trachea midline and thyroid normal to inspection and palpation Lungs: clear to auscultation bilaterally Breasts: normal appearance, no masses or tenderness, No nipple retraction or dimpling, No nipple discharge or bleeding, No axillary or supraclavicular adenopathy pendulous large breast with some extension of breast tissue in axillary on right breast,  Heart: regular rate and rhythm Abdomen: soft, non-tender; no masses,  no organomegaly Extremities: extremities normal, atraumatic, no cyanosis or edema Skin: Skin color, texture, turgor normal. No rashes or lesions Lymph nodes: Cervical, supraclavicular, and axillary nodes normal. No abnormal inguinal nodes palpated Neurologic: Grossly normal   Pelvic: External genitalia:  no lesions              Urethra:  normal appearing urethra with no  masses, tenderness or lesions              Bartholin's and Skene's: normal                 Vagina: normal appearing vagina with normal color and discharge, no lesions              Cervix: normal, non tender, slight blood noted from cervix              Pap taken: Yes.   Bimanual Exam:  Uterus:  normal size, contour, position, consistency, mobility, non-tender and mid position              Adnexa: normal adnexa and no mass, fullness, tenderness               Rectovaginal: Confirms               Anus:  normal appearance  Chaperone  present: Yes  A:  Well Woman with normal exam  Contraception OCP desired  STD screening  Obesity  P:   Reviewed health and wellness pertinent to exam  Rx Orthocyclen see order  Labs HIV,RPR,HEPC, STD panel, GC,Chalmydia  Discussed exercise, portion control and food selection for weight loss.  Pap smear taken today with HPV reflex   counseled on breast self exam, STD prevention, HIV risk factors and prevention, use and side effects of OCP's, adequate intake of calcium and vitamin D, diet and exercise  return annually or prn  An After Visit Summary was printed and given to the patient.

## 2014-12-19 ENCOUNTER — Telehealth: Payer: Self-pay

## 2014-12-19 LAB — HEPATITIS C ANTIBODY: HCV AB: NEGATIVE

## 2014-12-19 LAB — STD PANEL
HEP B S AG: NEGATIVE
HIV 1&2 Ab, 4th Generation: NONREACTIVE

## 2014-12-19 LAB — HEMOGLOBIN, FINGERSTICK: HEMOGLOBIN, FINGERSTICK: 13.1 g/dL (ref 12.0–16.0)

## 2014-12-19 LAB — HSV(HERPES SIMPLEX VRS) I + II AB-IGG: HSV 2 Glycoprotein G Ab, IgG: <0.1 IV

## 2014-12-19 NOTE — Telephone Encounter (Signed)
Let patient know that [facility] were [facility] and did not need to send them medical records

## 2014-12-20 LAB — IPS N GONORRHOEA AND CHLAMYDIA BY PCR

## 2014-12-20 LAB — IPS PAP TEST WITH REFLEX TO HPV

## 2014-12-24 NOTE — Progress Notes (Signed)
Reviewed personally.  M. Suzanne Zadyn Yardley, MD.  

## 2015-03-11 ENCOUNTER — Encounter (HOSPITAL_COMMUNITY): Payer: Self-pay | Admitting: Emergency Medicine

## 2015-03-11 ENCOUNTER — Emergency Department (HOSPITAL_COMMUNITY)
Admission: EM | Admit: 2015-03-11 | Discharge: 2015-03-11 | Disposition: A | Discharge status: Home / Self Care | Payer: BC Managed Care – PPO | Attending: Emergency Medicine | Admitting: Emergency Medicine

## 2015-03-11 ENCOUNTER — Emergency Department (HOSPITAL_COMMUNITY): Payer: BC Managed Care – PPO

## 2015-03-11 DIAGNOSIS — J45901 Unspecified asthma with (acute) exacerbation: Secondary | ICD-10-CM | POA: Insufficient documentation

## 2015-03-11 DIAGNOSIS — Z79899 Other long term (current) drug therapy: Secondary | ICD-10-CM | POA: Diagnosis not present

## 2015-03-11 DIAGNOSIS — R6889 Other general symptoms and signs: Secondary | ICD-10-CM

## 2015-03-11 DIAGNOSIS — R52 Pain, unspecified: Secondary | ICD-10-CM | POA: Diagnosis not present

## 2015-03-11 DIAGNOSIS — R509 Fever, unspecified: Secondary | ICD-10-CM | POA: Diagnosis not present

## 2015-03-11 LAB — CBC WITH DIFFERENTIAL/PLATELET
BASOS PCT: 0 % (ref 0–1)
Basophils Absolute: 0 10*3/uL (ref 0.0–0.1)
Eosinophils Absolute: 0.2 10*3/uL (ref 0.0–0.7)
Eosinophils Relative: 4 % (ref 0–5)
HCT: 40.2 % (ref 36.0–46.0)
HEMOGLOBIN: 13.6 g/dL (ref 12.0–15.0)
LYMPHS ABS: 1.6 10*3/uL (ref 0.7–4.0)
LYMPHS PCT: 35 % (ref 12–46)
MCH: 29.9 pg (ref 26.0–34.0)
MCHC: 33.8 g/dL (ref 30.0–36.0)
MCV: 88.4 fL (ref 78.0–100.0)
Monocytes Absolute: 0.6 10*3/uL (ref 0.1–1.0)
Monocytes Relative: 14 % — ABNORMAL HIGH (ref 3–12)
NEUTROS PCT: 47 % (ref 43–77)
Neutro Abs: 2.1 10*3/uL (ref 1.7–7.7)
Platelets: 193 10*3/uL (ref 150–400)
RBC: 4.55 MIL/uL (ref 3.87–5.11)
RDW: 12.8 % (ref 11.5–15.5)
WBC: 4.5 10*3/uL (ref 4.0–10.5)

## 2015-03-11 LAB — BASIC METABOLIC PANEL
ANION GAP: 8 (ref 5–15)
BUN: 6 mg/dL (ref 6–20)
CALCIUM: 8.3 mg/dL — AB (ref 8.9–10.3)
CO2: 26 mmol/L (ref 22–32)
Chloride: 103 mmol/L (ref 101–111)
Creatinine, Ser: 0.93 mg/dL (ref 0.44–1.00)
GFR calc Af Amer: >60 mL/min (ref >60)
GLUCOSE: 180 mg/dL — AB (ref 70–99)
Potassium: 3.7 mmol/L (ref 3.5–5.1)
SODIUM: 137 mmol/L (ref 135–145)

## 2015-03-11 MED ORDER — ALBUTEROL SULFATE HFA 108 (90 BASE) MCG/ACT IN AERS: 1.0000 | INHALATION_SPRAY | Freq: Four times a day (QID) | RESPIRATORY_TRACT | Status: AC | PRN

## 2015-03-11 MED ORDER — IPRATROPIUM-ALBUTEROL 0.5-2.5 (3) MG/3ML IN SOLN
3.0000 mL | Freq: Once | RESPIRATORY_TRACT | Status: AC
  Administered 2015-03-11: 3 mL via RESPIRATORY_TRACT
  Filled 2015-03-11: qty 3

## 2015-03-11 NOTE — ED Provider Notes (Signed)
CSN: [id number]     Arrival date & time 03/11/15  1027 History   First MD Initiated Contact with Patient 03/11/15 1032     Chief Complaint  Patient presents with  . Fever  . Generalized Body Aches     (Consider location/radiation/quality/duration/timing/severity/associated sxs/prior Treatment) Patient is a 29 y.o. female presenting with fever. The history is provided by the patient.  Fever Associated symptoms: congestion, headaches and myalgias   Associated symptoms: no chest pain, no confusion, no diarrhea, no dysuria, no nausea, no rash and no vomiting    patient the with flulike symptoms since Thursday. Fever up to 102 bodyaches mild headache congestion and sinus pressure occasionally productive cough. Patient has a history of asthma some wheezing out of her albuterol. No nausea vomiting or diarrhea. No new rash. Patient does have a history of some eczema.  Past Medical History  Diagnosis Date  . No pertinent past medical history   . Medical history non-contributory   . Asthma    Past Surgical History  Procedure Laterality Date  . No past surgeries     Family History  Problem Relation Age of Onset  . Diabetes Father   . Hypertension Father   . Anesthesia problems Neg Hx   . Kidney failure Father    History  Substance Use Topics  . Smoking status: Never Smoker   . Smokeless tobacco: Never Used  . Alcohol Use: 1.8 oz/week    3 Standard drinks or equivalent per week   OB History    Gravida Para Term Preterm AB TAB SAB Ectopic Multiple Living   3 2 2  0 1 0 1 0 0 3     Review of Systems  Constitutional: Positive for fever.  HENT: Positive for congestion and sinus pressure.   Eyes: Negative for redness.  Respiratory: Positive for shortness of breath and wheezing.   Cardiovascular: Negative for chest pain.  Gastrointestinal: Negative for nausea, vomiting, abdominal pain and diarrhea.  Genitourinary: Negative for dysuria.  Musculoskeletal: Positive for myalgias.   Skin: Negative for rash.  Neurological: Positive for headaches.  Hematological: Does not bruise/bleed easily.  Psychiatric/Behavioral: Negative for confusion.      Allergies  Review of patient's allergies indicates no known allergies.  Home Medications   Prior to Admission medications   Medication Sig Start Date End Date Taking? Authorizing Provider  Calcium Carbonate (CALCIUM 500 PO) Take 1 tablet by mouth daily.    Historical Provider, MD  cetirizine (ZYRTEC) 10 MG tablet Take 10 mg by mouth daily.    Historical Provider, MD  Cholecalciferol (VITAMIN D PO) Take 1 tablet by mouth daily.     Historical Provider, MD  ferrous sulfate 325 (65 FE) MG tablet Take 325 mg by mouth daily with breakfast.    Historical Provider, MD  Multiple Vitamin (MULTIVITAMIN WITH MINERALS) TABS tablet Take 1 tablet by mouth daily.    Historical Provider, MD  norgestimate-ethinyl estradiol (ORTHO-CYCLEN, 28,) 0.25-35 MG-MCG tablet Take 1 tablet by mouth daily. 12/18/14   Verner Choleborah S Leonard, CNM   BP 133/72 mmHg  Pulse 99  Temp(Src) 98.4 F (36.9 C) (Oral)  Resp 18  Ht 5\' 5"  (1.651 m)  Wt 245 lb (111.131 kg)  BMI 40.77 kg/m2  SpO2 98%  LMP 02/19/2015 Physical Exam  Constitutional: She is oriented to person, place, and time. She appears well-developed and well-nourished. No distress.  HENT:  Head: Normocephalic.  Mouth/Throat: Oropharynx is clear and moist. No oropharyngeal exudate.  Eyes: Conjunctivae and EOM  are normal. Pupils are equal, round, and reactive to light.  Neck: Normal range of motion.  Cardiovascular: Normal rate and regular rhythm.   Pulmonary/Chest: Effort normal and breath sounds normal. No respiratory distress.  Abdominal: Soft. Bowel sounds are normal. There is no tenderness.  Musculoskeletal: Normal range of motion.  Neurological: She is alert and oriented to person, place, and time. No cranial nerve deficit. She exhibits normal muscle tone.  Skin: Skin is warm. Rash noted.   Nursing note and vitals reviewed.   ED Course  Procedures (including critical care time) Labs Review Labs Reviewed  CBC WITH DIFFERENTIAL/PLATELET - Abnormal; Notable for the following:    Monocytes Relative 14 (*)    All other components within normal limits  BASIC METABOLIC PANEL - Abnormal; Notable for the following:    Glucose, Bld 180 (*)    Calcium 8.3 (*)    All other components within normal limits   Results for orders placed or performed during the hospital encounter of 03/11/15  CBC with Differential/Platelet  Result Value Ref Range   WBC 4.5 4.0 - 10.5 K/uL   RBC 4.55 3.87 - 5.11 MIL/uL   Hemoglobin 13.6 12.0 - 15.0 g/dL   HCT 16.1 09.6 - 04.5 %   MCV 88.4 78.0 - 100.0 fL   MCH 29.9 26.0 - 34.0 pg   MCHC 33.8 30.0 - 36.0 g/dL   RDW 40.9 81.1 - 91.4 %   Platelets 193 150 - 400 K/uL   Neutrophils Relative % 47 43 - 77 %   Neutro Abs 2.1 1.7 - 7.7 K/uL   Lymphocytes Relative 35 12 - 46 %   Lymphs Abs 1.6 0.7 - 4.0 K/uL   Monocytes Relative 14 (H) 3 - 12 %   Monocytes Absolute 0.6 0.1 - 1.0 K/uL   Eosinophils Relative 4 0 - 5 %   Eosinophils Absolute 0.2 0.0 - 0.7 K/uL   Basophils Relative 0 0 - 1 %   Basophils Absolute 0.0 0.0 - 0.1 K/uL  Basic metabolic panel  Result Value Ref Range   Sodium 137 135 - 145 mmol/L   Potassium 3.7 3.5 - 5.1 mmol/L   Chloride 103 101 - 111 mmol/L   CO2 26 22 - 32 mmol/L   Glucose, Bld 180 (H) 70 - 99 mg/dL   BUN 6 6 - 20 mg/dL   Creatinine, Ser 7.82 0.44 - 1.00 mg/dL   Calcium 8.3 (L) 8.9 - 10.3 mg/dL   GFR calc non Af Amer >60 >60 mL/min   GFR calc Af Amer >60 >60 mL/min   Anion gap 8 5 - 15     Imaging Review Dg Chest 2 View  03/11/2015   CLINICAL DATA:  Fever, cough, and body aches since Wednesday, history asthma, hypertension  EXAM: CHEST  2 VIEW  COMPARISON:  09/29/2011  FINDINGS: Upper normal heart size.  Normal mediastinal contours and pulmonary vascularity.  Minimal chronic peribronchial thickening consistent with  history of asthma.  No acute infiltrate, pleural effusion or pneumothorax.  Bones unremarkable.  IMPRESSION: No acute abnormalities.   Electronically Signed   By: Ulyses Southward M.D.   On: 03/11/2015 11:12     EKG Interpretation None      MDM   Final diagnoses:  Fever  Flu-like symptoms    Patient with flulike symptoms. Workup without evidence of pneumonia. Patient nontoxic no acute distress. Labs without any significant abnormalities. Will treat symptomatically.  Patient also with history of asthma no wheezing here  but was given a nebulizer treatment was feeling of better breathing. Will send home with a prescription for albuterol inhaler. Since she's out.  Vanetta MuldersScott Hazley Dezeeuw, MD 03/11/15 1147

## 2015-03-11 NOTE — Discharge Instructions (Signed)
Symptoms seem to be consistent with a flulike illness. Recommend the Mucinex DM 12 hour version and also taking Naprosyn or Aleve for the bodyaches. Work note provided. Return for any new or worse symptoms.

## 2015-03-11 NOTE — ED Notes (Signed)
Pt c/o fever (102.5) and body aches since Thursday. Pt has taken ibuprofen, last dose 0330 today.

## 2015-03-11 NOTE — ED Notes (Signed)
Pt returned from X-ray.  

## 2015-03-11 NOTE — ED Notes (Signed)
Dr Deretha Emoryzackowski at bedside

## 2015-07-25 ENCOUNTER — Encounter (HOSPITAL_COMMUNITY): Payer: Self-pay | Admitting: Family Medicine

## 2015-07-25 ENCOUNTER — Emergency Department (HOSPITAL_COMMUNITY)
Admission: EM | Admit: 2015-07-25 | Discharge: 2015-07-25 | Disposition: A | Discharge status: Home / Self Care | Payer: BC Managed Care – PPO | Attending: Emergency Medicine | Admitting: Emergency Medicine

## 2015-07-25 DIAGNOSIS — Z79899 Other long term (current) drug therapy: Secondary | ICD-10-CM | POA: Diagnosis not present

## 2015-07-25 DIAGNOSIS — Z793 Long term (current) use of hormonal contraceptives: Secondary | ICD-10-CM | POA: Diagnosis not present

## 2015-07-25 DIAGNOSIS — M79601 Pain in right arm: Secondary | ICD-10-CM | POA: Insufficient documentation

## 2015-07-25 DIAGNOSIS — J45909 Unspecified asthma, uncomplicated: Secondary | ICD-10-CM | POA: Diagnosis not present

## 2015-07-25 MED ORDER — NAPROXEN 500 MG PO TABS: 500.0000 mg | ORAL_TABLET | Freq: Two times a day (BID) | ORAL | Status: DC

## 2015-07-25 MED ORDER — HYDROCODONE-ACETAMINOPHEN 5-325 MG PO TABS
2.0000 | ORAL_TABLET | Freq: Once | ORAL | Status: AC
  Administered 2015-07-25: 2 via ORAL
  Filled 2015-07-25: qty 2

## 2015-07-25 NOTE — ED Notes (Signed)
Pt here for right forearm pain. Denies injury. sts she was sitting at her desk at work and started. sts she feels like something is pulling. sts hurts to touch.

## 2015-07-25 NOTE — Discharge Instructions (Signed)
Musculoskeletal Pain Return for numbness or swelling of the arm. Follow-up with a primary care provider using the resource guide below or [place] and wellness. Musculoskeletal pain is muscle and boney aches and pains. These pains can occur in any part of the body. Your caregiver may treat you without knowing the cause of the pain. They may treat you if blood or urine tests, X-rays, and other tests were normal.  CAUSES There is often not a definite cause or reason for these pains. These pains may be caused by a type of germ (virus). The discomfort may also come from overuse. Overuse includes working out too hard when your body is not fit. Boney aches also come from weather changes. Bone is sensitive to atmospheric pressure changes. HOME CARE INSTRUCTIONS   Ask when your test results will be ready. Make sure you get your test results.  Only take over-the-counter or prescription medicines for pain, discomfort, or fever as directed by your caregiver. If you were given medications for your condition, do not drive, operate machinery or power tools, or sign legal documents for 24 hours. Do not drink alcohol. Do not take sleeping pills or other medications that may interfere with treatment.  Continue all activities unless the activities cause more pain. When the pain lessens, slowly resume normal activities. Gradually increase the intensity and duration of the activities or exercise.  During periods of severe pain, bed rest may be helpful. Lay or sit in any position that is comfortable.  Putting ice on the injured area.  Put ice in a bag.  Place a towel between your skin and the bag.  Leave the ice on for 15 to 20 minutes, 3 to 4 times a day.  Follow up with your caregiver for continued problems and no reason can be found for the pain. If the pain becomes worse or does not go away, it may be necessary to repeat tests or do additional testing. Your caregiver may need to look further for a possible  cause. SEEK IMMEDIATE MEDICAL CARE IF:  You have pain that is getting worse and is not relieved by medications.  You develop chest pain that is associated with shortness or breath, sweating, feeling sick to your stomach (nauseous), or throw up (vomit).  Your pain becomes localized to the abdomen.  You develop any new symptoms that seem different or that concern you. MAKE SURE YOU:   Understand these instructions.  Will watch your condition.  Will get help right away if you are not doing well or get worse. Document Released: 10/20/2005 Document Revised: 01/12/2012 Document Reviewed: 06/24/2013 [facility] Patient Information 2015 [place], [place]. This information is not intended to replace advice given to you by your health care provider. Make sure you discuss any questions you have with your health care provider.  Emergency Department Resource Guide 1) Find a Doctor and Pay Out of Pocket Although you won't have to find out who is covered by your insurance plan, it is a good idea to ask around and get recommendations. You will then need to call the office and see if the doctor you have chosen will accept you as a new patient and what types of options they offer for patients who are self-pay. Some doctors offer discounts or will set up payment plans for their patients who do not have insurance, but you will need to ask so you aren't surprised when you get to your appointment.  2) Contact Your Local Health Department Not all health departments  have doctors that can see patients for sick visits, but many do, so it is worth a call to see if yours does. If you don't know where your local health department is, you can check in your phone book. The CDC also has a tool to help you locate your state's health department, and many state websites also have listings of all of their local health departments.  3) Find a [facility] If your illness is not likely to be very severe or complicated, you may  want to try a walk in clinic. These are popping up all over the country in pharmacies, drugstores, and shopping centers. They're usually staffed by nurse practitioners or physician assistants that have been trained to treat common illnesses and complaints. They're usually fairly quick and inexpensive. However, if you have serious medical issues or chronic medical problems, these are probably not your best option.  No Primary Care Doctor: - Call Health Connect at  [phone] - they can help you locate a primary care doctor that  accepts your insurance, provides certain services, etc. - Physician Referral Service- [phone]  Chronic Pain Problems: Organization         Address  Phone   Notes  [facility]  [phone] Patients need to be referred by their primary care doctor.   Medication Assistance: Organization         Address  Phone   Notes  [facility] Medication [facility] [address] [place], [place] [address] [phone] --Must be a resident of [facility] -- Must have NO insurance coverage whatsoever (no Medicaid/ Medicare, etc.) -- The pt. MUST have a primary care doctor that directs their care regularly and follows them in the community   MedAssist  [phone]   [employer]  [phone]    Agencies that provide inexpensive medical care: Organization         Address  Phone   Notes  [place]  [phone]   [facility]    [phone]   [facility] [place], [place] [address] [phone]   [place] [address] [place] [phone]   Planned Parenthood    [phone]   [facility]    [phone]   [place] [address], [place] Phone:  [phone], Fax:  [phone] Hours of Operation:  9 am - 6 pm, M-F.  Also accepts Medicaid/Medicare and self-pay.   [facility] for [place], [address], [place] Phone: [phone], Fax: [phone]. Hours of Operation:  8:30 am - 5:30 pm, M-F.  Also accepts Medicaid and self-pay.  [facility] [place] [address] [place] Phone: [phone]   [place], [place], [place] [phone], Ext. [phone] Mondays & Thursdays: 7-9 AM.  First 15 patients are seen on a first come, first serve basis.    [place] Providers:  Organization         Address  Phone   Notes  [facility] [address], [place] [phone] Also accepts self-pay patients.  [place], [place]  [phone]   [place], [address], [place] [phone]   [facility]  [address] [place] [phone]   Lucianne Lei [address], [place]   [phone] Only accepts [place] Access [place] patients after they have their name applied to their card.   Self-Pay (no insurance) in [facility]:  Organization         Address  Phone   Notes  Sickle Cell Patients, [facility] Internal Medicine [place] [phone]   [facility] [place] [phone]   [facility] [place]  [place], [place], [place] [phone]   [facility]/Dr. Osei-Bonsu  [address] [place] or [address], [place] [phone] Phone number for both [place] and [place] locations is the same.  Urgent Medical and [facility] [address], [place] [phone]   [facility] [address] [place] or [address] [phone]   [facility] [address], [place] [phone], phone; [phone], fax Sees patients 1st and 3rd Saturday of every month.  Must not qualify for public or private insurance (i.e. Medicaid, Medicare, [place] Health Choice, Veterans' Benefits)  Household income should be no more than 200% of the poverty level The clinic cannot treat you if you are pregnant or think you are pregnant  Sexually transmitted diseases are not treated at the clinic.    Dental Care: Organization         Address  Phone  Notes  [facility] Department of [facility] [place] [phone] Accepts children up to age 42 who are enrolled in [place] or [place]; pregnant women with a Medicaid card; and children who have applied for Medicaid or [place] Health Choice, but were declined, whose parents can pay a reduced fee at time of service.  [place] Department of [facility] [address]  [address], [place] [phone] Accepts children up to age 36 who are enrolled in [place] or [place]; pregnant women with a Medicaid card; and children who have applied for Medicaid or [place] Health Choice, but were declined, whose parents can pay a reduced fee at time of service.  [place] Adult Dental Access PROGRAM  [place] [phone] Patients are seen by appointment only. Walk-ins are not accepted. [place] will see patients 67 years of age and older. Monday - Tuesday (8am-5pm) Most Wednesdays (8:30-5pm) $30 per visit, cash only  [facility] Adult Dental Access PROGRAM  [address], [facility] [phone] Patients are seen by appointment only. Walk-ins are not accepted. [place] will see patients 43 years of age and older. One Wednesday Evening (Monthly: Volunteer Based).  $30 per visit, cash only  [place]  [phone] for adults; Children under age 74, call Graduate Pediatric Dentistry at [phone]. Children aged 8-14, please call [phone] to request a pediatric application.  Dental services are provided in all areas of dental care including fillings, crowns and bridges, complete and partial dentures, implants, gum treatment, root canals, and extractions. Preventive care is also provided. Treatment is provided to both adults and children. Patients are selected via a lottery and there is often a waiting list.   [facility] [address], [place]  [phone] [email or web address]  [address], [place], [place] [phone], [place] Thursday of each month, opens at 6:30 AM; Clinic ends at 9 AM.  Patients are seen on a first-come first-served basis, and a limited number are seen during each clinic.   [facility]  [address] Hillard Danker [place], [place] [phone]   Eligibility Requirements You must have lived in [place], [place], or [place] counties for at least the last three months.   You cannot be eligible for state or federal sponsored [employer], including [employer], [place], or [employer].   You generally cannot be eligible for healthcare insurance through your employer.    How to apply: Eligibility screenings are held every Tuesday and Wednesday afternoon from 1:00 pm until 4:00 pm. You do not need an appointment for the interview!  [facility] [address] [place], [place]   [place]  [place] Department  [place]  [phone]    Behavioral Health Resources in the Community: Intensive Outpatient Programs Organization         Address  Phone  Notes  [place]. [address] [place], [place] [phone]   [facility] Outpatient [address], [place], [place]   ADS: Alcohol & Drug Svcs [address] [place], [place]    [place] [address]  [place], [place] or [phone]   Substance Abuse Resources Organization         Address  Phone  Notes  Alcohol and Drug Services  [phone]   [place]  [phone]   The [place]   Chinita Pester  [phone]   Residential & Outpatient Substance Abuse Program  [phone]   Psychological Services Organization         Address  Phone  Notes  [facility]  [place]  [phone]   [place] [address], [place] or [phone]    Mobile Crisis Teams Organization         Address  Phone  Notes  [facility], Mobile Crisis Care Unit  [phone]   [facility]otherapeutic [facility]  [address] [place], [place]   [place] Levels [address], [place] [phone]    Self-Help/Support Groups Organization         Address  Phone             Notes  [place]. of [place] - variety of support groups  Happy Call for more information  Narcotics Anonymous (NA), [facility] [address], [employer] [place]  2 meetings at this location   Special educational needs teacher         Address  Phone  Notes  [facility] Residential Treatment [place],    [place]  [phone]   [facility]  [address], [place] [phone], [place], [place]   [place], [place] [phone] Admissions: 8am-3pm M-F  Incentives Substance [place] [address]    [place], [place] [phone]   [facility] [address] Jadene Pierini [place], [place]   The [place].,  [place], [place] - Intensive Outpatient [address] Kristeen Mans [place],  [place] [phone]   [facility] (Addiction Recovery Care Assoc.) [place].,  [place], [place] [phone] or [phone]   [facility]idential Treatment Services (RTS) [address] [place], [place] Accepts Medicaid  [facility] [place] [address],  [place] [phone] Substance Abuse/Addiction Treatment   [facility] Organization         Address  Phone  Notes  [facility]  [phone]   Domenic Schwab, PhD [address] Arlis Porta [place], [place]   [phone] or [phone]   [place], [place] [phone]   [address], [place], [place] [phone] Insurance/Medicaid/sponsorship through [facility] and Families [address] [place]                                    [place], [place] [phone] [place] [address] Street[address] [place], [place] [phone]    Dr. Adele Schilder  [phone]   [facility] [place] Dept. 1) [address] College 2) [place] 3)  [place] [address], [place] [phone]  [phone]   [place] [phone] or [phone] (After Hours)

## 2015-07-25 NOTE — ED Provider Notes (Signed)
CSN: [id number]     Arrival date & time 07/25/15  1649 History  This chart was scribed for non-physician practitioner Catha Gosselin, PA-C, working with Mirian Mo, MD, by Tanda Rockers, ED Scribe. This patient was seen in room TR10C/TR10C and the patient's care was started at 5:41 PM.  Chief Complaint  Patient presents with  . Arm Pain   The history is provided by the patient. No language interpreter was used.     HPI Comments: Amanda Melton is a 29 y.o. female who presents to the Emergency Department complaining of gradual onset, constant, 8/10, pulling, right forearm pain that began earlier today while at work. She says the pain began before 12 PM and resolved on its own but returned later. Pt states that she feels as if something is cutting off the circulation to her arm. No recent injury or trauma that could have caused the pain. Pt has taken Ibuprofen for the pain 6 hours ago with minimal relief. She has never had symptoms like this in the past.  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 Denies numbness or weakness, neck pain, or any other associated symptoms.    Past Medical History  Diagnosis Date  . No pertinent past medical history   . Medical history non-contributory   . Asthma    Past Surgical History  Procedure Laterality Date  . No past surgeries     Family History  Problem Relation Age of Onset  . Diabetes Father   . Hypertension Father   . Anesthesia problems Neg Hx   . Kidney failure Father    Social History  Substance Use Topics  . Smoking status: Never Smoker   . Smokeless tobacco: Never Used  . Alcohol Use: 1.8 oz/week    3 Standard drinks or equivalent per week   OB History    Gravida Para Term Preterm AB TAB SAB Ectopic Multiple Living   0 1 0 1 0 0 3     Review of Systems  Musculoskeletal: Positive for arthralgias (Right forearm pain).  Skin: Negative for wound.   Neurological: Negative for weakness and numbness.   Allergies  Review of patient's allergies indicates no known allergies.  Home Medications   Prior to Admission medications   Medication Sig Start Date End Date Taking? Authorizing Provider  albuterol (PROVENTIL HFA;VENTOLIN HFA) 108 (90 BASE) MCG/ACT inhaler Inhale 1-2 puffs into the lungs every 6 (six) hours as needed for wheezing or shortness of breath. 03/11/15   Vanetta Mulders, MD  Calcium Carbonate (CALCIUM 500 PO) Take 1 tablet by mouth daily.    Historical Provider, MD  cetirizine (ZYRTEC) 10 MG tablet Take 10 mg by mouth daily.    Historical Provider, MD  Cholecalciferol (VITAMIN D PO) Take 1 tablet by mouth daily.     Historical Provider, MD  ferrous sulfate 325 (65 FE) MG tablet Take 325 mg by mouth daily with breakfast.    Historical Provider, MD  Multiple Vitamin (MULTIVITAMIN WITH MINERALS) TABS tablet Take 1 tablet by mouth daily.    Historical Provider, MD  naproxen (NAPROSYN) 500 MG tablet Take 1 tablet (500 mg total) by mouth 2 (two) times daily. 07/25/15   Hanna Patel-Mills, PA-C  norgestimate-ethinyl estradiol (ORTHO-CYCLEN, 28,) 0.25-35 MG-MCG tablet Take 1 tablet by mouth daily. 12/18/14   Verner Chol, CNM   Triage Vitals: BP 139/99 mmHg  Pulse 63  Temp(Src) 98.2 F (36.8  C) (Oral)  Resp 18  Ht  (1.651 m)  Wt 260 lb (117.935 kg)  BMI 43.27 kg/m2  SpO2 100%  LMP 06/20/2015   Physical Exam  Constitutional: She is oriented to person, place, and time. She appears well-developed and well-nourished. No distress.  HENT:  Head: Normocephalic and atraumatic.  Eyes: Conjunctivae and EOM are normal.  Neck: Neck supple. No tracheal deviation present.  Cardiovascular: Normal rate.   Pulmonary/Chest: Effort normal. No respiratory distress.  Musculoskeletal: Normal range of motion.  Right arm: 2+ radial pulse 5/5 grip strength  <2 second capillary refill Able to flex and extend at the elbow and  wrist. Tenderness to palpation along the proximal right forearm but without ecchymosis, edema, or erythema.  Neurological: She is alert and oriented to person, place, and time.  Skin: Skin is warm and dry.  Psychiatric: She has a normal mood and affect. Her behavior is normal.  Nursing note and vitals reviewed.   ED Course  Procedures (including critical care time)  DIAGNOSTIC STUDIES: Oxygen Saturation is 100% on RA, normal by my interpretation.    COORDINATION OF CARE: 5:45 PM-Discussed treatment plan which includes pain medication in ED with pt at bedside and pt agreed to plan.   Labs Review Labs Reviewed - No data to display  Imaging Review No results found.    EKG Interpretation None      MDM   Final diagnoses:  Arm pain, anterior, right  Patient presents for right forearm pain that began earlier today. This is most likely musculoskeletal pain since it is worse with palpation. She is neurovascularly intact. My suspicion for DVT is low. She can take naproxen for pain and follow up with a primary care provider. She was given return precautions and verbally agrees with the plan. Rx: Naproxen Medications  HYDROcodone-acetaminophen (NORCO/VICODIN) 5-325 MG per tablet 2 tablet (2 tablets Oral Given 07/25/15 1801)   I personally performed the services described in this documentation, which was scribed in my presence. The recorded information has been reviewed and is accurate.     Catha Gosselin, PA-C 07/25/15 1819  Mirian Mo, MD 07/26/15 [phone]

## 2015-10-04 ENCOUNTER — Encounter (HOSPITAL_COMMUNITY): Payer: Self-pay | Admitting: *Deleted

## 2015-10-04 ENCOUNTER — Inpatient Hospital Stay (HOSPITAL_COMMUNITY)
Admission: AD | Admit: 2015-10-04 | Discharge: 2015-10-04 | Disposition: A | Discharge status: Home / Self Care | Payer: BC Managed Care – PPO | Source: Ambulatory Visit | Attending: Obstetrics and Gynecology | Admitting: Obstetrics and Gynecology

## 2015-10-04 DIAGNOSIS — Z3202 Encounter for pregnancy test, result negative: Secondary | ICD-10-CM | POA: Diagnosis not present

## 2015-10-04 DIAGNOSIS — N926 Irregular menstruation, unspecified: Secondary | ICD-10-CM

## 2015-10-04 DIAGNOSIS — R1032 Left lower quadrant pain: Secondary | ICD-10-CM

## 2015-10-04 DIAGNOSIS — R1031 Right lower quadrant pain: Secondary | ICD-10-CM

## 2015-10-04 HISTORY — DX: Other allergy status, other than to drugs and biological substances: Z91.09

## 2015-10-04 HISTORY — DX: Essential (primary) hypertension: I10

## 2015-10-04 LAB — URINE MICROSCOPIC-ADD ON

## 2015-10-04 LAB — URINALYSIS, ROUTINE W REFLEX MICROSCOPIC
Bilirubin Urine: NEGATIVE
GLUCOSE, UA: NEGATIVE mg/dL
KETONES UR: NEGATIVE mg/dL
Leukocytes, UA: NEGATIVE
Nitrite: NEGATIVE
PH: 6 (ref 5.0–8.0)
Protein, ur: NEGATIVE mg/dL
Specific Gravity, Urine: 1.02 (ref 1.005–1.030)

## 2015-10-04 LAB — POCT PREGNANCY, URINE: Preg Test, Ur: NEGATIVE

## 2015-10-04 LAB — WET PREP, GENITAL
CLUE CELLS WET PREP: NONE SEEN
SPERM: NONE SEEN
Trich, Wet Prep: NONE SEEN
Yeast Wet Prep HPF POC: NONE SEEN

## 2015-10-04 LAB — CBC
HCT: 39.4 % (ref 36.0–46.0)
HEMOGLOBIN: 13.5 g/dL (ref 12.0–15.0)
MCH: 30.4 pg (ref 26.0–34.0)
MCHC: 34.3 g/dL (ref 30.0–36.0)
MCV: 88.7 fL (ref 78.0–100.0)
Platelets: 290 10*3/uL (ref 150–400)
RBC: 4.44 MIL/uL (ref 3.87–5.11)
RDW: 13.3 % (ref 11.5–15.5)
WBC: 8.1 10*3/uL (ref 4.0–10.5)

## 2015-10-04 MED ORDER — KETOROLAC TROMETHAMINE 60 MG/2ML IM SOLN
60.0000 mg | Freq: Once | INTRAMUSCULAR | Status: AC
  Administered 2015-10-04: 60 mg via INTRAMUSCULAR
  Filled 2015-10-04: qty 2

## 2015-10-04 MED ORDER — IBUPROFEN 600 MG PO TABS: 600.0000 mg | ORAL_TABLET | Freq: Four times a day (QID) | ORAL | Status: DC | PRN

## 2015-10-04 NOTE — MAU Provider Note (Signed)
History     CSN: [id number]  Arrival date and time: 10/04/15 1033   First Provider Initiated Contact with Patient 10/04/15 1230      Chief Complaint  Patient presents with  . Abdominal Pain   HPI    Amanda Melton is a 29 y.o. female [phone] presenting to MAU with irregular menstrual bleeding and lower abdominal pain; both sides. The pain is cramp like. She currently rates her pain 8/10; bilateral lower abdomen.   Her period started on the 21st  and it was brown the whole 7 days. She cramped the whole time she was having the brown discharged.  She is concerned because it was brown and not red.  She started birth control pills 3 years ago and has never had an abnormal cycle. She did not call her GYN dr. To schedule/ discuss this issue.   Last night she took ibuprofen 800 mg and she went to bed.   OB History    Gravida Para Term Preterm AB TAB SAB Ectopic Multiple Living   3 2 2  0 1 0 1 0 0 2      Past Medical History  Diagnosis Date  . No pertinent past medical history   . Asthma   . Hypertension   . Environmental allergies     Past Surgical History  Procedure Laterality Date  . No past surgeries      Family History  Problem Relation Age of Onset  . Diabetes Father   . Hypertension Father   . Anesthesia problems Neg Hx   . Kidney failure Father     Social History  Substance Use Topics  . Smoking status: Never Smoker   . Smokeless tobacco: Never Used  . Alcohol Use: 1.8 oz/week    3 Standard drinks or equivalent per week    Allergies: No Known Allergies  Prescriptions prior to admission  Medication Sig Dispense Refill Last Dose  . norgestimate-ethinyl estradiol (ORTHO-CYCLEN, 28,) 0.25-35 MG-MCG tablet Take 1 tablet by mouth daily. 1 Package 12 10/03/2015 at Unknown time  . tetrahydrozoline-zinc (VISINE-AC) 0.05-0.25 % ophthalmic solution Place 2 drops into both eyes 3 (three) times daily as needed (for dryness/redness).   Past Week at Unknown time  .  albuterol (PROVENTIL HFA;VENTOLIN HFA) 108 (90 BASE) MCG/ACT inhaler Inhale 1-2 puffs into the lungs every 6 (six) hours as needed for wheezing or shortness of breath. 1 Inhaler 0 prn  . naproxen (NAPROSYN) 500 MG tablet Take 1 tablet (500 mg total) by mouth 2 (two) times daily. (Patient not taking: Reported on 10/04/2015) 30 tablet 0 Not Taking at Unknown time   Results for orders placed or performed during the hospital encounter of 10/04/15 (from the past 48 hour(s))  Urinalysis, Routine w reflex microscopic (not at [facility])     Status: Abnormal   Collection Time: 10/04/15 11:00 AM  Result Value Ref Range   Color, Urine YELLOW YELLOW   APPearance CLEAR CLEAR   Specific Gravity, Urine 1.020 1.005 - 1.030   pH 6.0 5.0 - 8.0   Glucose, UA NEGATIVE NEGATIVE mg/dL   Hgb urine dipstick TRACE (A) NEGATIVE   Bilirubin Urine NEGATIVE NEGATIVE   Ketones, ur NEGATIVE NEGATIVE mg/dL   Protein, ur NEGATIVE NEGATIVE mg/dL   Nitrite NEGATIVE NEGATIVE   Leukocytes, UA NEGATIVE NEGATIVE  Urine microscopic-add on     Status: Abnormal   Collection Time: 10/04/15 11:00 AM  Result Value Ref Range   Squamous Epithelial / LPF 0-5 (A) NONE SEEN  WBC, UA 0-5 0 - 5 WBC/hpf   RBC / HPF 0-5 0 - 5 RBC/hpf   Bacteria, UA RARE (A) NONE SEEN  Pregnancy, urine POC     Status: None   Collection Time: 10/04/15 11:05 AM  Result Value Ref Range   Preg Test, Ur NEGATIVE NEGATIVE    Comment:        THE SENSITIVITY OF THIS METHODOLOGY IS >24 mIU/mL   CBC     Status: None   Collection Time: 10/04/15 12:45 PM  Result Value Ref Range   WBC 8.1 4.0 - 10.5 K/uL   RBC 4.44 3.87 - 5.11 MIL/uL   Hemoglobin 13.5 12.0 - 15.0 g/dL   HCT 78.2 95.6 - 21.3 %   MCV 88.7 78.0 - 100.0 fL   MCH 30.4 26.0 - 34.0 pg   MCHC 34.3 30.0 - 36.0 g/dL   RDW 08.6 57.8 - 46.9 %   Platelets 290 150 - 400 K/uL  Wet prep, genital     Status: Abnormal   Collection Time: 10/04/15  1:10 PM  Result Value Ref Range   Yeast Wet Prep HPF POC NONE  SEEN NONE SEEN   Trich, Wet Prep NONE SEEN NONE SEEN   Clue Cells Wet Prep HPF POC NONE SEEN NONE SEEN   WBC, Wet Prep HPF POC FEW (A) NONE SEEN    Comment: MODERATE BACTERIA SEEN   Sperm NONE SEEN     Review of Systems  Constitutional: Negative for fever and chills.  Genitourinary:       + brown vaginal discharge. Denies new sexual partner.    Physical Exam   Blood pressure 142/83, pulse 64, temperature 98.3 F (36.8 C), resp. rate 16, last menstrual period 09/24/2015.  Physical Exam  Constitutional: She is oriented to person, place, and time. She appears well-developed and well-nourished. No distress.  HENT:  Head: Normocephalic.  Eyes: Pupils are equal, round, and reactive to light.  Neck: Neck supple.  Respiratory: Effort normal.  GI: Soft. She exhibits no distension and no mass. There is no tenderness. There is no rigidity, no rebound and no guarding.  Genitourinary:  Speculum exam: Vagina - Small amount of creamy, mucus like brown discharge, no odor Cervix - + contact bleeding  Bimanual exam: Cervix closed, no CMT  Uterus non tender, normal size GC/Chlam, wet prep done Chaperone present for exam.  Musculoskeletal: Normal range of motion.  Neurological: She is alert and oriented to person, place, and time.  Skin: Skin is warm. She is not diaphoretic.  Psychiatric: Her behavior is normal.    MAU Course  Procedures  None  MDM  Patient was sleeping when NP came into the room Toradol 60 mg IM given> pain significantly decreased.   Discussed patient with Dr. Edward Jolly. Patient recommended to follow up in the office    Assessment and Plan   A:  1. Abnormal menstrual cycle   2. Abdominal cramping, bilateral lower quadrant    P:  Discharge home in stable condition Rx: ibuprofen Return to MAU as needed for emergencies only Follow up with Dr. Edward Jolly.  Discussed that abnormal menstrual cycles can be normal for someone taking BC pills.    Duane Lope,  NP 10/04/2015 4:58 PM

## 2015-10-04 NOTE — Discharge Instructions (Signed)

## 2015-10-04 NOTE — MAU Note (Signed)
Pt presents to MAU with complaints of abdominal pain for two weeks. Last cycle started on November the 21st but the bleeding was brown in color

## 2015-10-05 LAB — GC/CHLAMYDIA PROBE AMP (~~LOC~~) NOT AT ARMC
Chlamydia: NEGATIVE
Neisseria Gonorrhea: NEGATIVE

## 2016-01-06 ENCOUNTER — Other Ambulatory Visit: Payer: Self-pay | Admitting: Certified Nurse Midwife

## 2016-01-07 NOTE — Telephone Encounter (Signed)
Patient returned call. Scheduled aex for wed 01/09/16. Patient asked if her refill can be called into her pharmacy since she scheduled aex.

## 2016-01-07 NOTE — Telephone Encounter (Signed)
Medication refill request: OCP Last AEX:  12-18-14  Next AEX: not scheduled  Last MMG (if hormonal medication request): N/A Refill authorized: please advise  I left patient a message letting her know that she needs an AEX -eh

## 2016-01-07 NOTE — Telephone Encounter (Signed)
Will do one refill. 

## 2016-01-09 ENCOUNTER — Ambulatory Visit (INDEPENDENT_AMBULATORY_CARE_PROVIDER_SITE_OTHER): Payer: BC Managed Care – PPO | Admitting: Certified Nurse Midwife

## 2016-01-09 ENCOUNTER — Encounter: Payer: Self-pay | Admitting: Certified Nurse Midwife

## 2016-01-09 ENCOUNTER — Other Ambulatory Visit: Payer: Self-pay | Admitting: Certified Nurse Midwife

## 2016-01-09 VITALS — BP 124/84 | HR 70 | Resp 16 | Ht 65.25 in | Wt 252.0 lb

## 2016-01-09 DIAGNOSIS — A499 Bacterial infection, unspecified: Secondary | ICD-10-CM | POA: Diagnosis not present

## 2016-01-09 DIAGNOSIS — Z0000 Encounter for general adult medical examination without abnormal findings: Secondary | ICD-10-CM | POA: Diagnosis not present

## 2016-01-09 DIAGNOSIS — N760 Acute vaginitis: Secondary | ICD-10-CM

## 2016-01-09 DIAGNOSIS — N926 Irregular menstruation, unspecified: Secondary | ICD-10-CM | POA: Diagnosis not present

## 2016-01-09 DIAGNOSIS — Z01419 Encounter for gynecological examination (general) (routine) without abnormal findings: Secondary | ICD-10-CM | POA: Diagnosis not present

## 2016-01-09 DIAGNOSIS — Z3041 Encounter for surveillance of contraceptive pills: Secondary | ICD-10-CM | POA: Diagnosis not present

## 2016-01-09 DIAGNOSIS — B9689 Other specified bacterial agents as the cause of diseases classified elsewhere: Secondary | ICD-10-CM

## 2016-01-09 LAB — LIPID PANEL
CHOL/HDL RATIO: 4.5 ratio (ref <=5.0)
Cholesterol: 175 mg/dL (ref 125–200)
HDL: 39 mg/dL — ABNORMAL LOW (ref >=46)
LDL Cholesterol: 114 mg/dL (ref <130)
TRIGLYCERIDES: 110 mg/dL (ref <150)
VLDL: 22 mg/dL (ref <30)

## 2016-01-09 LAB — POCT URINALYSIS DIPSTICK
Bilirubin, UA: NEGATIVE
Blood, UA: NEGATIVE
GLUCOSE UA: NEGATIVE
KETONES UA: NEGATIVE
Leukocytes, UA: NEGATIVE
Nitrite, UA: NEGATIVE
Protein, UA: NEGATIVE
Urobilinogen, UA: NEGATIVE
pH, UA: 5

## 2016-01-09 LAB — COMPREHENSIVE METABOLIC PANEL
ALT: 23 U/L (ref 6–29)
AST: 19 U/L (ref 10–30)
Albumin: 3.9 g/dL (ref 3.6–5.1)
Alkaline Phosphatase: 57 U/L (ref 33–115)
BILIRUBIN TOTAL: 0.3 mg/dL (ref 0.2–1.2)
BUN: 9 mg/dL (ref 7–25)
CALCIUM: 9 mg/dL (ref 8.6–10.2)
CHLORIDE: 103 mmol/L (ref 98–110)
CO2: 28 mmol/L (ref 20–31)
Creat: 0.9 mg/dL (ref 0.50–1.10)
Glucose, Bld: 108 mg/dL — ABNORMAL HIGH (ref 65–99)
Potassium: 3.7 mmol/L (ref 3.5–5.3)
Sodium: 137 mmol/L (ref 135–146)
Total Protein: 6.9 g/dL (ref 6.1–8.1)

## 2016-01-09 LAB — POCT URINE PREGNANCY: PREG TEST UR: NEGATIVE

## 2016-01-09 LAB — TSH: TSH: 1.05 mIU/L

## 2016-01-09 MED ORDER — NORGESTIMATE-ETH ESTRADIOL 0.25-35 MG-MCG PO TABS: 1.0000 | ORAL_TABLET | Freq: Every day | ORAL | Status: DC

## 2016-01-09 MED ORDER — METRONIDAZOLE 0.75 % VA GEL: VAGINAL | Status: DC

## 2016-01-09 NOTE — Patient Instructions (Signed)
EXERCISE AND DIET:  We recommended that you start or continue a regular exercise program for good health. Regular exercise means any activity that makes your heart beat faster and makes you sweat.  We recommend exercising at least 30 minutes per day at least 3 days a week, preferably 4 or 5.  We also recommend a diet low in fat and sugar.  Inactivity, poor dietary choices and obesity can cause diabetes, heart attack, stroke, and kidney damage, among others.    ALCOHOL AND SMOKING:  Women should limit their alcohol intake to no more than 7 drinks/beers/glasses of wine (combined, not each!) per week. Moderation of alcohol intake to this level decreases your risk of breast cancer and liver damage. And of course, no recreational drugs are part of a healthy lifestyle.  And absolutely no smoking or even second hand smoke. Most people know smoking can cause heart and lung diseases, but did you know it also contributes to weakening of your bones? Aging of your skin?  Yellowing of your teeth and nails?  CALCIUM AND VITAMIN D:  Adequate intake of calcium and Vitamin D are recommended.  The recommendations for exact amounts of these supplements seem to change often, but generally speaking 600 mg of calcium (either carbonate or citrate) and 800 units of Vitamin D per day seems prudent. Certain women may benefit from higher intake of Vitamin D.  If you are among these women, your doctor will have told you during your visit.    PAP SMEARS:  Pap smears, to check for cervical cancer or precancers,  have traditionally been done yearly, although recent scientific advances have shown that most women can have pap smears less often.  However, every woman still should have a physical exam from her gynecologist every year. It will include a breast check, inspection of the vulva and vagina to check for abnormal growths or skin changes, a visual exam of the cervix, and then an exam to evaluate the size and shape of the uterus and  ovaries.  And after 30 years of age, a rectal exam is indicated to check for rectal cancers. We will also provide age appropriate advice regarding health maintenance, like when you should have certain vaccines, screening for sexually transmitted diseases, bone density testing, colonoscopy, mammograms, etc.   MAMMOGRAMS:  All women over 40 years old should have a yearly mammogram. Many facilities now offer a "3D" mammogram, which may cost around $50 extra out of pocket. If possible,  we recommend you accept the option to have the 3D mammogram performed.  It both reduces the number of women who will be called back for extra views which then turn out to be normal, and it is better than the routine mammogram at detecting truly abnormal areas.    COLONOSCOPY:  Colonoscopy to screen for colon cancer is recommended for all women at age 50.  We know, you hate the idea of the prep.  We agree, BUT, having colon cancer and not knowing it is worse!!  Colon cancer so often starts as a polyp that can be seen and removed at colonscopy, which can quite literally save your life!  And if your first colonoscopy is normal and you have no family history of colon cancer, most women don't have to have it again for 10 years.  Once every ten years, you can do something that may end up saving your life, right?  We will be happy to help you get it scheduled when you are ready.    Be sure to check your insurance coverage so you understand how much it will cost.  It may be covered as a preventative service at no cost, but you should check your particular policy.      Bacterial Vaginosis Bacterial vaginosis is a vaginal infection that occurs when the normal balance of bacteria in the vagina is disrupted. It results from an overgrowth of certain bacteria. This is the most common vaginal infection in women of childbearing age. Treatment is important to prevent complications, especially in pregnant women, as it can cause a premature  delivery. CAUSES  Bacterial vaginosis is caused by an increase in harmful bacteria that are normally present in smaller amounts in the vagina. Several different kinds of bacteria can cause bacterial vaginosis. However, the reason that the condition develops is not fully understood. RISK FACTORS Certain activities or behaviors can put you at an increased risk of developing bacterial vaginosis, including:  Having a new sex partner or multiple sex partners.  Douching.  Using an intrauterine device (IUD) for contraception. Women do not get bacterial vaginosis from toilet seats, bedding, swimming pools, or contact with objects around them. SIGNS AND SYMPTOMS  Some women with bacterial vaginosis have no signs or symptoms. Common symptoms include:  Grey vaginal discharge.  A fishlike odor with discharge, especially after sexual intercourse.  Itching or burning of the vagina and vulva.  Burning or pain with urination. DIAGNOSIS  Your health care provider will take a medical history and examine the vagina for signs of bacterial vaginosis. A sample of vaginal fluid may be taken. Your health care provider will look at this sample under a microscope to check for bacteria and abnormal cells. A vaginal pH test may also be done.  TREATMENT  Bacterial vaginosis may be treated with antibiotic medicines. These may be given in the form of a pill or a vaginal cream. A second round of antibiotics may be prescribed if the condition comes back after treatment. Because bacterial vaginosis increases your risk for sexually transmitted diseases, getting treated can help reduce your risk for chlamydia, gonorrhea, HIV, and herpes. HOME CARE INSTRUCTIONS   Only take over-the-counter or prescription medicines as directed by your health care provider.  If antibiotic medicine was prescribed, take it as directed. Make sure you finish it even if you start to feel better.  Tell all sexual partners that you have a  vaginal infection. They should see their health care provider and be treated if they have problems, such as a mild rash or itching.  During treatment, it is important that you follow these instructions:  Avoid sexual activity or use condoms correctly.  Do not douche.  Avoid alcohol as directed by your health care provider.  Avoid breastfeeding as directed by your health care provider. SEEK MEDICAL CARE IF:   Your symptoms are not improving after 3 days of treatment.  You have increased discharge or pain.  You have a fever. MAKE SURE YOU:   Understand these instructions.  Will watch your condition.  Will get help right away if you are not doing well or get worse. FOR MORE INFORMATION  Centers for Disease Control and Prevention, Division of STD Prevention: www.cdc.gov/std American Sexual Health Association (ASHA): www.ashastd.org    This information is not intended to replace advice given to you by your health care provider. Make sure you discuss any questions you have with your health care provider.   Document Released: 10/20/2005 Document Revised: 11/10/2014 Document Reviewed: 06/01/2013 Elsevier Interactive Patient Education   [employer].

## 2016-01-09 NOTE — Progress Notes (Signed)
30 y.o. [id number] married  African American Fe here for annual exam. Periods normal, no issues. Periods normal, no issues.  Contraception OCP working well. Happily married. Has noted some increase in vaginal odor and discharge for the past few days. No new personal products or UTI symptoms.. No itching or burning. Desires STD screening but no concerns. Also would like screening labs if indicated. Sees Urgent care if needed. No other health issues today.  Patient's last menstrual period was 12/31/2015.          Sexually active: Yes.    The current method of family planning is none.    Exercising: No.  exercise Smoker:  no  Health Maintenance: Pap:  12-18-14 neg MMG:  none Colonoscopy:  none BMD:   none TDaP:  UTD Shingles: no Pneumonia: no Hep C and HIV: HIV & hep c neg 2016 Labs: hgb-12.6, upt neg, poct urine-neg Self breast exam: done monthly   reports that she has never smoked. She has never used smokeless tobacco. She reports that she drinks about 1.8 oz of alcohol per week. She reports that she does not use illicit drugs.  Past Medical History  Diagnosis Date  . No pertinent past medical history   . Asthma   . Hypertension   . Environmental allergies     Past Surgical History  Procedure Laterality Date  . No past surgeries      Current Outpatient Prescriptions  Medication Sig Dispense Refill  . albuterol (PROVENTIL HFA;VENTOLIN HFA) 108 (90 BASE) MCG/ACT inhaler Inhale 1-2 puffs into the lungs every 6 (six) hours as needed for wheezing or shortness of breath. 1 Inhaler 0  . ibuprofen (ADVIL,MOTRIN) 600 MG tablet Take 1 tablet (600 mg total) by mouth every 6 (six) hours as needed. 30 tablet 0  . tetrahydrozoline-zinc (VISINE-AC) 0.05-0.25 % ophthalmic solution Place 2 drops into both eyes 3 (three) times daily as needed (for dryness/redness).    Marland Kitchen MONO-LINYAH 0.25-35 MG-MCG tablet take 1 tablet by mouth once daily (Patient not taking: Reported on 01/09/2016) 28 tablet 0   No  current facility-administered medications for this visit.    Family History  Problem Relation Age of Onset  . Diabetes Father   . Hypertension Father   . Anesthesia problems Neg Hx   . Kidney failure Father     ROS:  Pertinent items are noted in HPI.  Otherwise, a comprehensive ROS was negative.  Exam:   BP 124/84 mmHg  Pulse 70  Resp 16  Ht 5' 5.25" (1.657 m)  Wt 252 lb (114.306 kg)  BMI 41.63 kg/m2  LMP 12/31/2015 Height: 5' 5.25" (165.7 cm) Ht Readings from Last 3 Encounters:  01/09/16 5' 5.25" (1.657 m)  07/25/15  (1.651 m)  03/11/15  (1.651 m)    General appearance: alert, cooperative and appears stated age Head: Normocephalic, without obvious abnormality, atraumatic Neck: no adenopathy, supple, symmetrical, trachea midline and thyroid normal to inspection and palpation Lungs: clear to auscultation bilaterally Breasts: normal appearance, no masses or tenderness, No nipple retraction or dimpling, No nipple discharge or bleeding, No axillary or supraclavicular adenopathy Heart: regular rate and rhythm Abdomen: soft, non-tender; no masses,  no organomegaly Extremities: extremities normal, atraumatic, no cyanosis or edema Skin: Skin color, texture, turgor normal. No rashes or lesions Lymph nodes: Cervical, supraclavicular, and axillary nodes normal. No abnormal inguinal nodes palpated Neurologic: Grossly normal   Pelvic: External genitalia:  no lesions  Urethra:  normal appearing urethra with no masses, tenderness or lesions              Bartholin's and Skene's: normal                 Vagina: normal appearing vagina with normal color and watery grey odorous discharge, no lesions, Ph 5.0, wet prep taken              Cervix: normal,non tender, no lesions              Pap taken: No. Bimanual Exam:  Uterus:  normal size, contour, position, consistency, mobility, non-tender              Adnexa: normal adnexa and no mass, fullness, tenderness                Rectovaginal: Confirms               Anus:  normal sphincter tone, no lesions  Chaperone present: yes  A:  Well Woman with normal exam  Contraception OCP desired  BV  Screening labs/STD screening  Obese, but working on weight loss, down 4 pounds  P:   Reviewed health and wellness pertinent to exam  Rx Mono-Linyah see order  Discussed finding of BV and etiology. Discussed baking soda bath to help with odor control. Questions addressed.  Rx Metrogel with instructions see order  Lab: Lipid panel, TSH,CMP, HIV,RPR, GC,Chlamydia  Pap smear as above not taken   counseled on breast self exam, STD prevention, HIV risk factors and prevention, use and side effects of OCP's, adequate intake of calcium and vitamin D, diet and exercise  return annually or prn  An After Visit Summary was printed and given to the patient.

## 2016-01-10 LAB — RPR

## 2016-01-10 LAB — HEMOGLOBIN A1C
Hgb A1c MFr Bld: 5.7 % — ABNORMAL HIGH (ref <5.7)
MEAN PLASMA GLUCOSE: 117 mg/dL — AB (ref <117)

## 2016-01-10 LAB — HIV ANTIBODY (ROUTINE TESTING W REFLEX): HIV 1&2 Ab, 4th Generation: NONREACTIVE

## 2016-01-11 LAB — IPS N GONORRHOEA AND CHLAMYDIA BY PCR

## 2016-01-11 LAB — HEMOGLOBIN, FINGERSTICK: HEMOGLOBIN, FINGERSTICK: 12.6 g/dL (ref 12.0–16.0)

## 2016-01-13 NOTE — Progress Notes (Signed)
Encounter reviewed Greer Koeppen, MD   

## 2016-02-22 ENCOUNTER — Encounter (HOSPITAL_COMMUNITY): Payer: Self-pay | Admitting: Emergency Medicine

## 2016-02-22 ENCOUNTER — Emergency Department (HOSPITAL_COMMUNITY)
Admission: EM | Admit: 2016-02-22 | Discharge: 2016-02-22 | Disposition: A | Discharge status: Home / Self Care | Payer: BC Managed Care – PPO | Attending: Emergency Medicine | Admitting: Emergency Medicine

## 2016-02-22 DIAGNOSIS — Z79899 Other long term (current) drug therapy: Secondary | ICD-10-CM | POA: Diagnosis not present

## 2016-02-22 DIAGNOSIS — Z793 Long term (current) use of hormonal contraceptives: Secondary | ICD-10-CM | POA: Insufficient documentation

## 2016-02-22 DIAGNOSIS — I10 Essential (primary) hypertension: Secondary | ICD-10-CM | POA: Diagnosis not present

## 2016-02-22 DIAGNOSIS — J45909 Unspecified asthma, uncomplicated: Secondary | ICD-10-CM | POA: Insufficient documentation

## 2016-02-22 DIAGNOSIS — L0291 Cutaneous abscess, unspecified: Secondary | ICD-10-CM

## 2016-02-22 DIAGNOSIS — L02411 Cutaneous abscess of right axilla: Secondary | ICD-10-CM | POA: Diagnosis not present

## 2016-02-22 MED ORDER — DOXYCYCLINE HYCLATE 100 MG PO CAPS: 100.0000 mg | ORAL_CAPSULE | Freq: Two times a day (BID) | ORAL | Status: DC

## 2016-02-22 MED ORDER — LIDOCAINE HCL (PF) 1 % IJ SOLN
5.0000 mL | Freq: Once | INTRAMUSCULAR | Status: AC
  Administered 2016-02-22: 5 mL via INTRADERMAL
  Filled 2016-02-22: qty 5

## 2016-02-22 NOTE — ED Provider Notes (Signed)
CSN: [id number]     Arrival date & time 02/22/16  5621 History   First MD Initiated Contact with Patient 02/22/16 [id number]     Chief Complaint  Patient presents with  . Abscess   Patient is a 30 y.o. female presenting with abscess.  Abscess Location:  Shoulder/arm Shoulder/arm abscess location:  R axilla Size:  3 cm Abscess quality: fluctuance, painful and redness   Abscess quality: not draining, no induration, no warmth and not weeping   Red streaking: no   Duration:  1 week Progression:  Unchanged Chronicity:  Recurrent Relieved by:  Nothing Ineffective treatments:  Warm compresses Associated symptoms: no fever, no nausea and no vomiting   Risk factors: prior abscess    Amanda Melton is a 30 year old female presenting with an abscess. She reports a painful abscess to the right axilla 1 week. She reports getting these frequently in both axillary areas and the groin. She notes the skin overlying the abscess appears pink. The abscess is not draining though she notes that there is a head on it. Typically they resolve with warm compresses. She has never needed incision and drainage in the past. She has not been evaluated by her PCP or surgeon for these recurrent abscesses. Denies systemic symptoms including fever, chills, near-syncope, nausea or vomiting. She has no other complaints at this time.  Past Medical History  Diagnosis Date  . No pertinent past medical history   . Asthma   . Hypertension   . Environmental allergies    Past Surgical History  Procedure Laterality Date  . No past surgeries     Family History  Problem Relation Age of Onset  . Diabetes Father   . Hypertension Father   . Anesthesia problems Neg Hx   . Kidney failure Father    Social History  Substance Use Topics  . Smoking status: Never Smoker   . Smokeless tobacco: Never Used  . Alcohol Use: 1.8 oz/week    3 Standard drinks or equivalent per week   OB History    Gravida Para Term Preterm AB TAB SAB  Ectopic Multiple Living   0 1 0 1 0 0 2     Review of Systems  Constitutional: Negative for fever.  Gastrointestinal: Negative for nausea and vomiting.  All other systems reviewed and are negative.     Allergies  Review of patient's allergies indicates no known allergies.  Home Medications   Prior to Admission medications   Medication Sig Start Date End Date Taking? Authorizing Provider  albuterol (PROVENTIL HFA;VENTOLIN HFA) 108 (90 BASE) MCG/ACT inhaler Inhale 1-2 puffs into the lungs every 6 (six) hours as needed for wheezing or shortness of breath. 03/11/15  Yes Amanda Mulders, MD  ibuprofen (ADVIL,MOTRIN) 200 MG tablet Take 600 mg by mouth every 6 (six) hours as needed for moderate pain.   Yes Historical Provider, MD  norgestimate-ethinyl estradiol (MONO-LINYAH) 0.25-35 MG-MCG tablet Take 1 tablet by mouth daily. 01/09/16  Yes Amanda Melton, Amanda Melton  tetrahydrozoline-zinc (VISINE-AC) 0.05-0.25 % ophthalmic solution Place 2 drops into both eyes 3 (three) times daily as needed (for dryness/redness).   Yes Historical Provider, MD  doxycycline (VIBRAMYCIN) 100 MG capsule Take 1 capsule (100 mg total) by mouth 2 (two) times daily. 02/22/16   Amanda Veitch, PA-C  ibuprofen (ADVIL,MOTRIN) 600 MG tablet Take 1 tablet (600 mg total) by mouth every 6 (six) hours as needed. 10/04/15   Amanda Lope, Amanda Melton  metroNIDAZOLE (METROGEL) 0.75 % vaginal  gel One applicator every hs x 5 01/09/16   Amanda Melton, Amanda Melton   BP 147/95 mmHg  Pulse 80  Temp(Src) 98.2 F (36.8 C) (Oral)  Resp 18  Ht 5\' 5"  (1.651 m)  Wt 117.028 kg  BMI 42.93 kg/m2  SpO2 99%  LMP 01/28/2016 Physical Exam  Constitutional: She appears well-developed and well-nourished. No distress.  HENT:  Head: Normocephalic and atraumatic.  Right Ear: External ear normal.  Left Ear: External ear normal.  Eyes: Conjunctivae are normal. Right eye exhibits no discharge. Left eye exhibits no discharge. No scleral icterus.  Neck:  Normal range of motion.  Cardiovascular: Normal rate.   Pulmonary/Chest: Effort normal.  Musculoskeletal: Normal range of motion.  Moves all extremities spontaneously  Neurological: She is alert. Coordination normal.  Skin: Skin is warm and dry.  2 cm area of fluctuance noted to right axilla. No surrounding induration. Surrounding erythema of approximately 6 cm without streaking down the arm. Area is tender to palpation. No warmth or active drainage.  Psychiatric: She has a normal mood and affect. Her behavior is normal.  Nursing note and vitals reviewed.   ED Course  .Marland Kitchen.Incision and Drainage Date/Time: 02/22/2016 10:55 AM Performed by: Amanda Melton, Amanda Melton Authorized by: Amanda Melton, Austan Nicholl Consent: Verbal consent obtained. Risks and benefits: risks, benefits and alternatives were discussed Consent given by: patient Patient understanding: patient states understanding of the procedure being performed Patient consent: the patient's understanding of the procedure matches consent given Procedure consent: procedure consent matches procedure scheduled Required items: required blood products, implants, devices, and special equipment available Patient identity confirmed: verbally with patient Type: abscess Location: right axilla. Anesthesia: local infiltration Local anesthetic: lidocaine 1% without epinephrine Anesthetic total: 4 ml Scalpel size: 11 Incision type: single straight Complexity: simple Drainage: purulent Drainage amount: moderate Packing material: 1/2 in iodoform gauze   (including critical care time) Labs Review Labs Reviewed - No data to display  Imaging Review No results found. I have personally reviewed and evaluated these images and lab results as part of my medical decision-making.   EKG Interpretation None      MDM   Final diagnoses:  Abscess   Patient presenting with skin abscess of the right axilla amenable to incision and drainage.  Patient tolerated the  procedure well. Iodoform packing inserted. Given recurrence of abscesses and surrounding erythema, will d/c with doxy. Encouraged warm soaks at home and keeping the wound clean and dry. Instructed to go to PCP or urgent care in 2 days for wound recheck. Given non-emergent general surgery referral information as pt likely has hidradenitis. Patient expresses understanding and is stable for discharge.      Amanda HeimlichStevi Joseh Sjogren, PA-C 02/22/16 1105  Doug SouSam Jacubowitz, MD 02/22/16 1706

## 2016-02-22 NOTE — ED Notes (Signed)
Pt complaint of recurrent right underarm abscess.

## 2016-02-22 NOTE — Discharge Instructions (Signed)
Abscess °An abscess is an infected area that contains a collection of pus and debris. It can occur in almost any part of the body. An abscess is also known as a furuncle or boil. °CAUSES  °An abscess occurs when tissue gets infected. This can occur from blockage of oil or sweat glands, infection of hair follicles, or a minor injury to the skin. As the body tries to fight the infection, pus collects in the area and creates pressure under the skin. This pressure causes pain. People with weakened immune systems have difficulty fighting infections and get certain abscesses more often.  °SYMPTOMS °Usually an abscess develops on the skin and becomes a painful mass that is red, warm, and tender. If the abscess forms under the skin, you may feel a moveable soft area under the skin. Some abscesses break open (rupture) on their own, but most will continue to get worse without care. The infection can spread deeper into the body and eventually into the bloodstream, causing you to feel ill.  °DIAGNOSIS  °Your caregiver will take your medical history and perform a physical exam. A sample of fluid may also be taken from the abscess to determine what is causing your infection. °TREATMENT  °Your caregiver may prescribe antibiotic medicines to fight the infection. However, taking antibiotics alone usually does not cure an abscess. Your caregiver may need to make a small cut (incision) in the abscess to drain the pus. In some cases, gauze is packed into the abscess to reduce pain and to continue draining the area. °HOME CARE INSTRUCTIONS  °· Only take over-the-counter or prescription medicines for pain, discomfort, or fever as directed by your caregiver. °· If you were prescribed antibiotics, take them as directed. Finish them even if you start to feel better. °· If gauze is used, follow your caregiver's directions for changing the gauze. °· To avoid spreading the infection: °· Keep your draining abscess covered with a  bandage. °· Wash your hands well. °· Do not share personal care items, towels, or whirlpools with others. °· Avoid skin contact with others. °· Keep your skin and clothes clean around the abscess. °· Keep all follow-up appointments as directed by your caregiver. °SEEK MEDICAL CARE IF:  °· You have increased pain, swelling, redness, fluid drainage, or bleeding. °· You have muscle aches, chills, or a general ill feeling. °· You have a fever. °MAKE SURE YOU:  °· Understand these instructions. °· Will watch your condition. °· Will get help right away if you are not doing well or get worse. °  °This information is not intended to replace advice given to you by your health care provider. Make sure you discuss any questions you have with your health care provider. °  °Document Released: 07/30/2005 Document Revised: 04/20/2012 Document Reviewed: 01/02/2012 °Elsevier Interactive Patient Education ©2016 [employer] ° °Incision and Drainage °Incision and drainage is a procedure in which a sac-like structure (cystic structure) is opened and drained. The area to be drained usually contains material such as pus, fluid, or blood.  °LET YOUR CAREGIVER KNOW ABOUT:  °· Allergies to medicine. °· Medicines taken, including vitamins, herbs, eyedrops, over-the-counter medicines, and creams. °· Use of steroids (by mouth or creams). °· Previous problems with anesthetics or numbing medicines. °· History of bleeding problems or blood clots. °· Previous surgery. °· Other health problems, including diabetes and kidney problems. °· Possibility of pregnancy, if this applies. °RISKS AND COMPLICATIONS °· Pain. °· Bleeding. °· Scarring. °· Infection. °BEFORE THE PROCEDURE  °  You may need to have an ultrasound or other imaging tests to see how large or deep your cystic structure is. Blood tests may also be used to determine if you have an infection or how severe the infection is. You may need to have a tetanus shot. °PROCEDURE  °The affected area  is cleaned with a cleaning fluid. The cyst area will then be numbed with a medicine (local anesthetic). A small incision will be made in the cystic structure. A syringe or catheter may be used to drain the contents of the cystic structure, or the contents may be squeezed out. The area will then be flushed with a cleansing solution. After cleansing the area, it is often gently packed with a gauze or another wound dressing. Once it is packed, it will be covered with gauze and tape or some other type of wound dressing.  °AFTER THE PROCEDURE  °· Often, you will be allowed to go home right after the procedure. °· You may be given antibiotic medicine to prevent or heal an infection. °· If the area was packed with gauze or some other wound dressing, you will likely need to come back in 1 to 2 days to get it removed. °· The area should heal in about 14 days. °  °This information is not intended to replace advice given to you by your health care provider. Make sure you discuss any questions you have with your health care provider. °  °Document Released: 04/15/2001 Document Revised: 04/20/2012 Document Reviewed: 12/15/2011 °Elsevier Interactive Patient Education ©2016 Elsevier Inc. ° °

## 2016-04-14 ENCOUNTER — Emergency Department (HOSPITAL_COMMUNITY)
Admission: EM | Admit: 2016-04-14 | Discharge: 2016-04-14 | Disposition: A | Discharge status: Home / Self Care | Payer: BC Managed Care – PPO | Attending: Emergency Medicine | Admitting: Emergency Medicine

## 2016-04-14 ENCOUNTER — Encounter (HOSPITAL_COMMUNITY): Payer: Self-pay | Admitting: Emergency Medicine

## 2016-04-14 DIAGNOSIS — I10 Essential (primary) hypertension: Secondary | ICD-10-CM | POA: Insufficient documentation

## 2016-04-14 DIAGNOSIS — J45909 Unspecified asthma, uncomplicated: Secondary | ICD-10-CM | POA: Insufficient documentation

## 2016-04-14 DIAGNOSIS — Z791 Long term (current) use of non-steroidal anti-inflammatories (NSAID): Secondary | ICD-10-CM | POA: Insufficient documentation

## 2016-04-14 DIAGNOSIS — Z79899 Other long term (current) drug therapy: Secondary | ICD-10-CM | POA: Insufficient documentation

## 2016-04-14 DIAGNOSIS — R1084 Generalized abdominal pain: Secondary | ICD-10-CM | POA: Diagnosis present

## 2016-04-14 DIAGNOSIS — A084 Viral intestinal infection, unspecified: Secondary | ICD-10-CM | POA: Insufficient documentation

## 2016-04-14 LAB — URINE MICROSCOPIC-ADD ON: WBC, UA: NONE SEEN WBC/hpf (ref 0–5)

## 2016-04-14 LAB — COMPREHENSIVE METABOLIC PANEL
ALBUMIN: 3.8 g/dL (ref 3.5–5.0)
ALK PHOS: 61 U/L (ref 38–126)
ALT: 19 U/L (ref 14–54)
ANION GAP: 7 (ref 5–15)
AST: 17 U/L (ref 15–41)
BUN: 6 mg/dL (ref 6–20)
CHLORIDE: 104 mmol/L (ref 101–111)
CO2: 24 mmol/L (ref 22–32)
Calcium: 9 mg/dL (ref 8.9–10.3)
Creatinine, Ser: 0.78 mg/dL (ref 0.44–1.00)
GFR calc Af Amer: >60 mL/min (ref >60)
GFR calc non Af Amer: >60 mL/min (ref >60)
GLUCOSE: 81 mg/dL (ref 65–99)
POTASSIUM: 3.3 mmol/L — AB (ref 3.5–5.1)
SODIUM: 135 mmol/L (ref 135–145)
Total Bilirubin: 0.3 mg/dL (ref 0.3–1.2)
Total Protein: 7.8 g/dL (ref 6.5–8.1)

## 2016-04-14 LAB — CBC
HEMATOCRIT: 42.2 % (ref 36.0–46.0)
HEMOGLOBIN: 14.7 g/dL (ref 12.0–15.0)
MCH: 30.5 pg (ref 26.0–34.0)
MCHC: 34.8 g/dL (ref 30.0–36.0)
MCV: 87.6 fL (ref 78.0–100.0)
Platelets: 290 10*3/uL (ref 150–400)
RBC: 4.82 MIL/uL (ref 3.87–5.11)
RDW: 12.9 % (ref 11.5–15.5)
WBC: 12.3 10*3/uL — ABNORMAL HIGH (ref 4.0–10.5)

## 2016-04-14 LAB — URINALYSIS, ROUTINE W REFLEX MICROSCOPIC
GLUCOSE, UA: NEGATIVE mg/dL
Ketones, ur: NEGATIVE mg/dL
LEUKOCYTES UA: NEGATIVE
Nitrite: NEGATIVE
Protein, ur: NEGATIVE mg/dL
SPECIFIC GRAVITY, URINE: 1.031 — AB (ref 1.005–1.030)
pH: 6 (ref 5.0–8.0)

## 2016-04-14 LAB — POC URINE PREG, ED: Preg Test, Ur: NEGATIVE

## 2016-04-14 LAB — LIPASE, BLOOD: LIPASE: 25 U/L (ref 11–51)

## 2016-04-14 MED ORDER — BISMUTH SUBSALICYLATE 262 MG/15ML PO SUSP
30.0000 mL | ORAL | Status: DC | PRN
  Administered 2016-04-14: 30 mL via ORAL
  Filled 2016-04-14: qty 118

## 2016-04-14 MED ORDER — SODIUM CHLORIDE 0.9 % IV BOLUS (SEPSIS)
1000.0000 mL | Freq: Once | INTRAVENOUS | Status: AC
  Administered 2016-04-14: 1000 mL via INTRAVENOUS

## 2016-04-14 MED ORDER — KETOROLAC TROMETHAMINE 30 MG/ML IJ SOLN
30.0000 mg | Freq: Once | INTRAMUSCULAR | Status: AC
  Administered 2016-04-14: 30 mg via INTRAVENOUS
  Filled 2016-04-14: qty 1

## 2016-04-14 MED ORDER — OXYCODONE-ACETAMINOPHEN 5-325 MG PO TABS
1.0000 | ORAL_TABLET | ORAL | Status: DC | PRN
  Administered 2016-04-14: 1 via ORAL
  Filled 2016-04-14: qty 1

## 2016-04-14 NOTE — ED Notes (Signed)
Pt given pain med in triage, pt's family at bedside with pt.

## 2016-04-14 NOTE — ED Notes (Signed)
Patient reports generalized constant abdominal pain since last night. Reports nausea/vomiting/diarrhea 1 time last night; none today. PO intake makes the pain worse.

## 2016-04-14 NOTE — Discharge Instructions (Signed)
Please follow-up with your primary care physician within the next 3 days.  You may take over-the-counter Pepto-bismol as instructed.   Viral Gastroenteritis Viral gastroenteritis is also known as stomach flu. This condition affects the stomach and intestinal tract. It can cause sudden diarrhea and vomiting. The illness typically lasts 3 to 8 days. Most people develop an immune response that eventually gets rid of the virus. While this natural response develops, the virus can make you quite ill. CAUSES  Many different viruses can cause gastroenteritis, such as rotavirus or noroviruses. You can catch one of these viruses by consuming contaminated food or water. You may also catch a virus by sharing utensils or other personal items with an infected person or by touching a contaminated surface. SYMPTOMS  The most common symptoms are diarrhea and vomiting. These problems can cause a severe loss of body fluids (dehydration) and a body salt (electrolyte) imbalance. Other symptoms may include:  Fever.  Headache.  Fatigue.  Abdominal pain. DIAGNOSIS  Your caregiver can usually diagnose viral gastroenteritis based on your symptoms and a physical exam. A stool sample may also be taken to test for the presence of viruses or other infections. TREATMENT  This illness typically goes away on its own. Treatments are aimed at rehydration. The most serious cases of viral gastroenteritis involve vomiting so severely that you are not able to keep fluids down. In these cases, fluids must be given through an intravenous line (IV). HOME CARE INSTRUCTIONS   Drink enough fluids to keep your urine clear or pale yellow. Drink small amounts of fluids frequently and increase the amounts as tolerated.  Ask your caregiver for specific rehydration instructions.  Avoid:  Foods high in sugar.  Alcohol.  Carbonated drinks.  Tobacco.  Juice.  Caffeine drinks.  Extremely hot or cold fluids.  Fatty, greasy  foods.  Too much intake of anything at one time.  Dairy products until 24 to 48 hours after diarrhea stops.  You may consume probiotics. Probiotics are active cultures of beneficial bacteria. They may lessen the amount and number of diarrheal stools in adults. Probiotics can be found in yogurt with active cultures and in supplements.  Wash your hands well to avoid spreading the virus.  Only take over-the-counter or prescription medicines for pain, discomfort, or fever as directed by your caregiver. Do not give aspirin to children. Antidiarrheal medicines are not recommended.  Ask your caregiver if you should continue to take your regular prescribed and over-the-counter medicines.  Keep all follow-up appointments as directed by your caregiver. SEEK IMMEDIATE MEDICAL CARE IF:   You are unable to keep fluids down.  You do not urinate at least once every 6 to 8 hours.  You develop shortness of breath.  You notice blood in your stool or vomit. This may look like coffee grounds.  You have abdominal pain that increases or is concentrated in one small area (localized).  You have persistent vomiting or diarrhea.  You have a fever.  The patient is a child younger than 3 months, and he or she has a fever.  The patient is a child older than 3 months, and he or she has a fever and persistent symptoms.  The patient is a child older than 3 months, and he or she has a fever and symptoms suddenly get worse.  The patient is a baby, and he or she has no tears when crying. MAKE SURE YOU:   Understand these instructions.  Will watch your condition.  Will  get help right away if you are not doing well or get worse.   This information is not intended to replace advice given to you by your health care provider. Make sure you discuss any questions you have with your health care provider.   Document Released: 10/20/2005 Document Revised: 01/12/2012 Document Reviewed: 08/06/2011 Elsevier  Interactive Patient Education [employer]

## 2016-04-14 NOTE — ED Notes (Signed)
Pt is aware a urine sample is needed but does feel she can go. Will check back after fluids have infused.

## 2016-04-14 NOTE — ED Provider Notes (Signed)
CSN: [id number]     Arrival date & time 04/14/16  1203 History   First MD Initiated Contact with Patient 04/14/16 1648     Chief Complaint  Patient presents with  . Abdominal Pain     (Consider location/radiation/quality/duration/timing/severity/associated sxs/prior Treatment) HPI  Patient is a 30 yo F with a PMHx of asthma, HTN presenting to the ED with a chief complaint of abdominal pain. Patient reports having acute onset generalized abdominal pain since yesterday evening when she was driving. States the pain is constant, 10/10 intensity, and she describes it as "someone jumping over my intestines." Reports vomiting once last night - non-bilious, non-bloody vomit. Also reports having 4 episodes of lose stools last night. Denies having any melena or hematochezia. States her abdominal pain is worse with eating and nothing makes it better. States her diarrhea and nausea/ vomiting have resolved but she continues to have abdominal pain today. The pain radiates to her flank region bilaterally. She denies having any dysuria or history of kidney stones. Reports having fevers and chills. No recent sick contacts. Patient states her LMP was May 13th and she has not had her period yet since month. Reports having regular menstrual cycles and is currently on an OCP. She has been pregnant 3 times -  2 living children, 1 miscarriage in 2009. Patient denies having any vaginal discharge or dyuspareunia. No other complaints.   Past Medical History  Diagnosis Date  . No pertinent past medical history   . Asthma   . Hypertension   . Environmental allergies    Past Surgical History  Procedure Laterality Date  . No past surgeries     Family History  Problem Relation Age of Onset  . Diabetes Father   . Hypertension Father   . Anesthesia problems Neg Hx   . Kidney failure Father    Social History  Substance Use Topics  . Smoking status: Never Smoker   . Smokeless tobacco: Never Used  . Alcohol Use: 1.8  oz/week    3 Standard drinks or equivalent per week   OB History    Gravida Para Term Preterm AB TAB SAB Ectopic Multiple Living   3 2 2  0 1 0 1 0 0 2     Review of Systems  Constitutional: Positive for fever and chills.  HENT: Negative for congestion and sore throat.   Eyes: Negative for pain and visual disturbance.  Respiratory: Negative for cough, shortness of breath and wheezing.   Cardiovascular: Negative for chest pain, palpitations and leg swelling.  Gastrointestinal: Positive for nausea, vomiting, abdominal pain and diarrhea. Negative for constipation, blood in stool and abdominal distention.  Genitourinary: Positive for flank pain. Negative for dysuria, vaginal bleeding, vaginal discharge, vaginal pain, menstrual problem and dyspareunia.  Musculoskeletal: Negative for myalgias and arthralgias.  Skin: Negative for pallor and rash.  Neurological: Negative for dizziness, weakness, light-headedness and numbness.      Allergies  Review of patient's allergies indicates no known allergies.  Home Medications   Prior to Admission medications   Medication Sig Start Date End Date Taking? Authorizing Provider  albuterol (PROVENTIL HFA;VENTOLIN HFA) 108 (90 BASE) MCG/ACT inhaler Inhale 1-2 puffs into the lungs every 6 (six) hours as needed for wheezing or shortness of breath. 03/11/15  Yes Vanetta MuldersScott Zackowski, MD  ibuprofen (ADVIL,MOTRIN) 600 MG tablet Take 1 tablet (600 mg total) by mouth every 6 (six) hours as needed. 10/04/15  Yes Duane LopeJennifer I Rasch, NP  norgestimate-ethinyl estradiol (MONO-LINYAH) 0.25-35 MG-MCG tablet Take  1 tablet by mouth daily. 01/09/16  Yes Verner Chol, CNM  tetrahydrozoline-zinc (VISINE-AC) 0.05-0.25 % ophthalmic solution Place 2 drops into both eyes 3 (three) times daily as needed (for dryness/redness).   Yes Historical Provider, MD  doxycycline (VIBRAMYCIN) 100 MG capsule Take 1 capsule (100 mg total) by mouth 2 (two) times daily. Patient not taking: Reported on  04/14/2016 02/22/16   Rolm Gala Barrett, PA-C  metroNIDAZOLE (METROGEL) 0.75 % vaginal gel One applicator every hs x 5 Patient not taking: Reported on 04/14/2016 01/09/16   Verner Chol, CNM   BP 140/84 mmHg  Pulse 105  Temp(Src) 98.2 F (36.8 C) (Oral)  Resp 18  Ht  (1.651 m)  Wt 120.203 kg  BMI 44.10 kg/m2  SpO2 98%  LMP 03/15/2016 Physical Exam  Constitutional: She is oriented to person, place, and time. She appears well-developed and well-nourished. No distress.  HENT:  Head: Normocephalic and atraumatic.  Tongue appears dry   Eyes: EOM are normal. Pupils are equal, round, and reactive to light.  Neck: Neck supple. No tracheal deviation present.  Cardiovascular: Normal rate, regular rhythm and intact distal pulses.  Exam reveals no gallop and no friction rub.   No murmur heard. Pulmonary/Chest: Effort normal and breath sounds normal. No respiratory distress. She has no wheezes. She has no rales.  Abdominal: Soft. Bowel sounds are normal. She exhibits no distension. There is tenderness. There is no rebound and no guarding.  Abdomen tender to palpation in the LUQ and LLQ.   Musculoskeletal: She exhibits no edema or tenderness.  Neurological: She is alert and oriented to person, place, and time.  Skin: Skin is warm and dry. No rash noted. No erythema.    ED Course  Procedures (including critical care time) Labs Review Labs Reviewed  COMPREHENSIVE METABOLIC PANEL - Abnormal; Notable for the following:    Potassium 3.3 (*)    All other components within normal limits  CBC - Abnormal; Notable for the following:    WBC 12.3 (*)    All other components within normal limits  LIPASE, BLOOD  URINALYSIS, ROUTINE W REFLEX MICROSCOPIC (NOT AT [facility])  POC URINE PREG, ED    Imaging Review No results found. I have personally reviewed and evaluated these images and lab results as part of my medical decision-making.   EKG Interpretation None      MDM   Final diagnoses:   None    Viral gastroenteritis  Patient is presenting with a 1 day history of diffuse colicky abdominal pain (worse after eating). A/w nausea, vomiting, and diarrhea last night. Diarrhea and nausea/ vomiting have resolved but she continues to have LUQ and LLQ abdominal pain on exam. She is afebrile but slightly tachycardic (HR 105 on admission). BP stable. CBC showing mild leukocytosis (WBC 12.3). Lipase normal. She is mildly hypokalemic (K 3.3) likely secondary to GI loss from diarrhea last night. LFTs including AST, ALT, and alkaline phosphatase are normal. UA is not suggestive of an infection. Urine pregnancy test negative.  -1L bolus of normal saline -Pepto-bismol q4 prn -Percocet 5-325 mg 1 tablet once  -Toradol 30 mg once  -Patient has been advised to continue taking over the counter Pepto-bismol. Advised to return to the ED or seek medical attention immediately if her abdominal pain worsens.      John Giovanni, MD 04/14/16 [id number]  Lyndal Pulley, MD 04/15/16 [phone]

## 2016-10-28 ENCOUNTER — Inpatient Hospital Stay (HOSPITAL_COMMUNITY)
Admission: AD | Admit: 2016-10-28 | Discharge: 2016-10-28 | Disposition: A | Discharge status: Home / Self Care | Payer: BC Managed Care – PPO | Source: Ambulatory Visit | Attending: Obstetrics and Gynecology | Admitting: Obstetrics and Gynecology

## 2016-10-28 ENCOUNTER — Encounter (HOSPITAL_COMMUNITY): Payer: Self-pay | Admitting: Student

## 2016-10-28 DIAGNOSIS — N760 Acute vaginitis: Secondary | ICD-10-CM | POA: Insufficient documentation

## 2016-10-28 DIAGNOSIS — N730 Acute parametritis and pelvic cellulitis: Secondary | ICD-10-CM | POA: Diagnosis not present

## 2016-10-28 DIAGNOSIS — Z841 Family history of disorders of kidney and ureter: Secondary | ICD-10-CM | POA: Insufficient documentation

## 2016-10-28 DIAGNOSIS — Z8249 Family history of ischemic heart disease and other diseases of the circulatory system: Secondary | ICD-10-CM | POA: Insufficient documentation

## 2016-10-28 DIAGNOSIS — Z833 Family history of diabetes mellitus: Secondary | ICD-10-CM | POA: Insufficient documentation

## 2016-10-28 DIAGNOSIS — B9689 Other specified bacterial agents as the cause of diseases classified elsewhere: Secondary | ICD-10-CM | POA: Insufficient documentation

## 2016-10-28 HISTORY — DX: Chlamydial infection, unspecified: A74.9

## 2016-10-28 LAB — WET PREP, GENITAL
SPERM: NONE SEEN
TRICH WET PREP: NONE SEEN
YEAST WET PREP: NONE SEEN

## 2016-10-28 LAB — URINALYSIS, ROUTINE W REFLEX MICROSCOPIC
Bilirubin Urine: NEGATIVE
GLUCOSE, UA: NEGATIVE mg/dL
Hgb urine dipstick: NEGATIVE
KETONES UR: NEGATIVE mg/dL
LEUKOCYTES UA: NEGATIVE
NITRITE: NEGATIVE
PROTEIN: NEGATIVE mg/dL
Specific Gravity, Urine: 1.025 (ref 1.005–1.030)
pH: 6 (ref 5.0–8.0)

## 2016-10-28 LAB — CBC
HCT: 38.2 % (ref 36.0–46.0)
HEMOGLOBIN: 13.4 g/dL (ref 12.0–15.0)
MCH: 30.9 pg (ref 26.0–34.0)
MCHC: 35.1 g/dL (ref 30.0–36.0)
MCV: 88 fL (ref 78.0–100.0)
PLATELETS: 271 10*3/uL (ref 150–400)
RBC: 4.34 MIL/uL (ref 3.87–5.11)
RDW: 13 % (ref 11.5–15.5)
WBC: 7.8 10*3/uL (ref 4.0–10.5)

## 2016-10-28 LAB — POCT PREGNANCY, URINE: Preg Test, Ur: NEGATIVE

## 2016-10-28 MED ORDER — CEFTRIAXONE SODIUM 250 MG IJ SOLR
250.0000 mg | Freq: Once | INTRAMUSCULAR | Status: AC
  Administered 2016-10-28: 250 mg via INTRAMUSCULAR
  Filled 2016-10-28: qty 250

## 2016-10-28 MED ORDER — KETOROLAC TROMETHAMINE 60 MG/2ML IM SOLN
60.0000 mg | Freq: Once | INTRAMUSCULAR | Status: AC
  Administered 2016-10-28: 60 mg via INTRAMUSCULAR
  Filled 2016-10-28: qty 2

## 2016-10-28 MED ORDER — DOXYCYCLINE HYCLATE 100 MG PO CAPS: 100.0000 mg | ORAL_CAPSULE | Freq: Two times a day (BID) | ORAL | 0 refills | Status: DC

## 2016-10-28 MED ORDER — METRONIDAZOLE 500 MG PO TABS: 500.0000 mg | ORAL_TABLET | Freq: Two times a day (BID) | ORAL | 0 refills | Status: DC

## 2016-10-28 NOTE — MAU Provider Note (Signed)
History     CSN: [id number]  Arrival date and time: 10/28/16 1133   First Provider Initiated Contact with Patient 10/28/16 1350      Chief Complaint  Patient presents with  . Vaginal Discharge  . Abdominal Pain   HPI  Amanda Melton is a 30 y.o. 653P2012 female who presents for abdominal pain & vaginal discharge. Symptoms began 3 days ago. Lower abdominal pain that comes & goes. Occurs every 15-20 minutes and lasts for <5 minutes at a time. Describes as stabbing pain that she rates 5-8/10. Has been taking ibuprofen which was working initially but mor recently has not been as effective. Vaginal discharge is described as thin & off white in color with foul odor. Endorses some nausea & dyspareunia for the last week.  Denies vomiting, diarrhea, constipation, dysuria, fever/chills, vaginal bleeding, or postcoital bleeding.    Past Medical History:  Diagnosis Date  . Asthma   . Chlamydia 2005  . Environmental allergies   . Hypertension     Past Surgical History:  Procedure Laterality Date  . NO PAST SURGERIES      Family History  Problem Relation Age of Onset  . Diabetes Father   . Hypertension Father   . Kidney failure Father   . Anesthesia problems Neg Hx     Social History  Substance Use Topics  . Smoking status: Never Smoker  . Smokeless tobacco: Never Used  . Alcohol use 1.8 oz/week    3 Standard drinks or equivalent per week     Comment: occasionally    Allergies: No Known Allergies  No prescriptions prior to admission.    Review of Systems  Constitutional: Negative.   Gastrointestinal: Positive for abdominal pain and nausea. Negative for constipation, diarrhea and vomiting.  Genitourinary: Negative for dysuria and frequency.       + dyspareunia + vaginal discharge No vaginal bleeding or postcoital bleeding   Physical Exam   Blood pressure 119/84, pulse 64, temperature 98.1 F (36.7 C), temperature source Oral, resp. rate 18, height 5\' 5"  (1.651 m),  weight 242 lb (109.8 kg), last menstrual period 10/04/2016.  Physical Exam  Nursing note and vitals reviewed. Constitutional: She is oriented to person, place, and time. She appears well-developed and well-nourished. No distress.  HENT:  Head: Normocephalic and atraumatic.  Eyes: Conjunctivae are normal. Right eye exhibits no discharge. Left eye exhibits no discharge. No scleral icterus.  Neck: Normal range of motion.  Cardiovascular: Normal rate, regular rhythm and normal heart sounds.   No murmur heard. Respiratory: Effort normal and breath sounds normal. No respiratory distress. She has no wheezes.  GI: Soft. Bowel sounds are normal. There is tenderness in the right lower quadrant, suprapubic area and left lower quadrant. There is no rigidity, no rebound and no guarding.  Genitourinary: Cervix exhibits motion tenderness, discharge (moderate amount of thin tan frothy discharge) and friability. Right adnexum displays no tenderness and no fullness. Left adnexum displays no tenderness and no fullness.  Neurological: She is alert and oriented to person, place, and time.  Skin: Skin is warm and dry. She is not diaphoretic.  Psychiatric: She has a normal mood and affect. Her behavior is normal. Judgment and thought content normal.    MAU Course  Procedures Results for orders placed or performed during the hospital encounter of 10/28/16 (from the past 24 hour(s))  Urinalysis, Routine w reflex microscopic (not [facility])     Status: None   Collection Time: 10/28/16 12:03 PM  Result Value Ref Range   Color, Urine YELLOW YELLOW   APPearance CLEAR CLEAR   Specific Gravity, Urine 1.025 1.005 - 1.030   pH 6.0 5.0 - 8.0   Glucose, UA NEGATIVE NEGATIVE mg/dL   Hgb urine dipstick NEGATIVE NEGATIVE   Bilirubin Urine NEGATIVE NEGATIVE   Ketones, ur NEGATIVE NEGATIVE mg/dL   Protein, ur NEGATIVE NEGATIVE mg/dL   Nitrite NEGATIVE NEGATIVE   Leukocytes, UA NEGATIVE NEGATIVE  Pregnancy, urine POC      Status: None   Collection Time: 10/28/16 12:13 PM  Result Value Ref Range   Preg Test, Ur NEGATIVE NEGATIVE  CBC     Status: None   Collection Time: 10/28/16  2:04 PM  Result Value Ref Range   WBC 7.8 4.0 - 10.5 K/uL   RBC 4.34 3.87 - 5.11 MIL/uL   Hemoglobin 13.4 12.0 - 15.0 g/dL   HCT 16.138.2 09.636.0 - 04.546.0 %   MCV 88.0 78.0 - 100.0 fL   MCH 30.9 26.0 - 34.0 pg   MCHC 35.1 30.0 - 36.0 g/dL   RDW 40.913.0 81.111.5 - 91.415.5 %   Platelets 271 150 - 400 K/uL  Wet prep, genital     Status: Abnormal   Collection Time: 10/28/16  2:17 PM  Result Value Ref Range   Yeast Wet Prep HPF POC NONE SEEN NONE SEEN   Trich, Wet Prep NONE SEEN NONE SEEN   Clue Cells Wet Prep HPF POC PRESENT (A) NONE SEEN   WBC, Wet Prep HPF POC MANY (A) NONE SEEN   Sperm NONE SEEN    MDM UPT negative CBC, GC/CT, wet prep Toradol 60 mg IM Will tx for PID based on exam findings & many WBCs on wet prep S/w Dr. Edward JollySilva. Agrees with plan for PID tx. Will discharge home & have pt call office for f/u on Thursday.   Assessment and Plan  A: 1. PID (acute pelvic inflammatory disease)   2. BV (bacterial vaginosis)    P: Discharge home Rx doxycycline 100 mg BID x 2 weeks Rx metronidazole 500 mg BID x 1 week Pelvic rest Call office in the morning to schedule f/u on Thursday Discussed reasons to return to MAU or ED GC/CT pending  Judeth Hornrin Gleen Ripberger 10/28/2016, 1:49 PM

## 2016-10-28 NOTE — Discharge Instructions (Signed)
Pelvic Inflammatory Disease °Introduction °Pelvic inflammatory disease (PID) refers to an infection in some or all of the female organs. The infection can be in the uterus, ovaries, fallopian tubes, or the surrounding tissues in the pelvis. PID can cause abdominal or pelvic pain that comes on suddenly (acute pelvic pain). PID is a serious infection because it can lead to lasting (chronic) pelvic pain or the inability to have children (infertility). °What are the causes? °This condition is most often caused by an infection that is spread during sexual contact. However, the infection can also be caused by the normal bacteria that are found in the vaginal tissues if these bacteria travel upward into the reproductive organs. PID can also occur following: °· The birth of a baby. °· A miscarriage. °· An abortion. °· Major pelvic surgery. °· The use of an intrauterine device (IUD). °· A sexual assault. °What increases the risk? °This condition is more likely to develop in women who: °· Are younger than 30 years of age. °· Are sexually active at a young age. °· Use nonbarrier contraception. °· Have multiple sexual partners. °· Have sex with someone who has symptoms of an STD (sexually transmitted disease). °· Use oral contraception. °At times, certain behaviors can also increase the possibility of getting PID, such as: °· Using a vaginal douche. °· Having an IUD in place. °What are the signs or symptoms? °Symptoms of this condition include: °· Abdominal or pelvic pain. °· Fever. °· Chills. °· Abnormal vaginal discharge. °· Abnormal uterine bleeding. °· Unusual pain shortly after the end of a menstrual period. °· Painful urination. °· Pain with sexual intercourse. °· Nausea and vomiting. °How is this diagnosed? °To diagnose this condition, your health care provider will do a physical exam and take your medical history. A pelvic exam typically reveals great tenderness in the uterus and the surrounding pelvic tissues. You may  also have tests, such as: °· Lab tests, including a pregnancy test, blood tests, and urine test. °· Culture tests of the vagina and cervix to check for an STD. °· Ultrasound. °· A laparoscopic procedure to look inside the pelvis. °· Examining vaginal secretions under a microscope. °How is this treated? °Treatment for this condition may involve one or more approaches. °· Antibiotic medicines may be prescribed to be taken by mouth. °· Sexual partners may need to be treated if the infection is caused by an STD. °· For more severe cases, hospitalization may be needed to give antibiotics directly into a vein through an IV tube. °· Surgery may be needed if other treatments do not help, but this is rare. °It may take weeks until you are completely well. If you are diagnosed with PID, you should also be checked for human immunodeficiency virus (HIV). Your health care provider may test you for infection again 3 months after treatment. You should not have unprotected sex. °Follow these instructions at home: °· Take over-the-counter and prescription medicines only as told by your health care provider. °· If you were prescribed an antibiotic medicine, take it as told by your health care provider. Do not stop taking the antibiotic even if you start to feel better. °· Do not have sexual intercourse until treatment is completed or as told by your health care provider. If PID is confirmed, your recent sexual partners will need treatment, especially if you had unprotected sex. °· Keep all follow-up visits as told by your health care provider. This is important. °Contact a health care provider if: °· You have   increased or abnormal vaginal discharge. °· Your pain does not improve. °· You vomit. °· You have a fever. °· You cannot tolerate your medicines. °· Your partner has an STD. °· You have pain when you urinate. °Get help right away if: °· You have increased abdominal or pelvic pain. °· You have chills. °· Your symptoms are not  better in 72 hours even with treatment. °This information is not intended to replace advice given to you by your health care provider. Make sure you discuss any questions you have with your health care provider. °Document Released: 10/20/2005 Document Revised: 03/27/2016 Document Reviewed: 11/27/2014 °© 2017 Elsevier ° °

## 2016-10-28 NOTE — MAU Note (Signed)
Pt C/O vaginal discharge with odor for a week, also lower abd pain for the last 2-3 days. Denies bleeding.  Denies fever or dysuria.

## 2016-10-29 LAB — GC/CHLAMYDIA PROBE AMP (~~LOC~~) NOT AT ARMC
Chlamydia: NEGATIVE
Neisseria Gonorrhea: NEGATIVE

## 2016-11-03 NOTE — L&D Delivery Note (Signed)
Patient is a 31 y.o. now [id number] who admitted for SOL at [redacted]w[redacted]d this morning. Patient initially denies LOF, but noted leaking here, unsure of time ROM. Fetal tachycardia, but no maternal fever.  Delivery Note At 11:15 AM a viable female was delivered via Vaginal, Spontaneous Delivery Presentation: LOA.  APGAR: 4,  Weight 6 lb 10.2 oz (3010 g).   Placenta status: intact, sent to pathlogy  Cord: 3-vessel. Cord pH: 7.313  Anesthesia:  Epidural Episiotomy: None Lacerations: Labial, bilateral Suture Repair: 3.0 vicryl rapide Est. Blood Loss (mL):  250  Head delivered LOA. No nuchal cord present. Shoulder and body delivered in usual fashion; body cord noted. Infant stimulated and bulb suction, no spontaneous cry.  Cord clamped x 2 and cut immediately and handed to neonatal team. Arterial cord pH and cord blood drawn. Placenta delivered spontaneously with gentle cord traction. 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 Uterus firm with continued massage. Perineum inspected and found to have bilateral labial lacerations, which which were repaired with 30 vycril rapide with good hemostasis achieved.  Mom to postpartum.  Baby to NICU.   Raynelle Fanning P. Degele, MD OB Fellow 06/21/17, 11:57 AM

## 2016-11-10 ENCOUNTER — Telehealth: Payer: Self-pay | Admitting: Certified Nurse Midwife

## 2016-11-12 ENCOUNTER — Ambulatory Visit (INDEPENDENT_AMBULATORY_CARE_PROVIDER_SITE_OTHER): Payer: BLUE CROSS/BLUE SHIELD | Admitting: *Deleted

## 2016-11-12 DIAGNOSIS — Z3202 Encounter for pregnancy test, result negative: Secondary | ICD-10-CM

## 2016-11-12 DIAGNOSIS — N912 Amenorrhea, unspecified: Secondary | ICD-10-CM

## 2016-11-12 LAB — POCT URINE PREGNANCY: Preg Test, Ur: POSITIVE — AB

## 2016-11-12 MED ORDER — PRENATAL PLUS 27-1 MG PO TABS: 1.0000 | ORAL_TABLET | Freq: Every day | ORAL | 0 refills | Status: DC

## 2016-11-12 NOTE — Progress Notes (Signed)
LMP 10/04/2016 [id number] EDD 07/11/17

## 2016-11-23 ENCOUNTER — Inpatient Hospital Stay (HOSPITAL_COMMUNITY)
Admission: AD | Admit: 2016-11-23 | Discharge: 2016-11-23 | Disposition: A | Discharge status: Home / Self Care | Payer: BLUE CROSS/BLUE SHIELD | Source: Ambulatory Visit | Attending: Obstetrics & Gynecology | Admitting: Obstetrics & Gynecology

## 2016-11-23 ENCOUNTER — Encounter (HOSPITAL_COMMUNITY): Payer: Self-pay | Admitting: *Deleted

## 2016-11-23 DIAGNOSIS — N898 Other specified noninflammatory disorders of vagina: Secondary | ICD-10-CM | POA: Insufficient documentation

## 2016-11-23 DIAGNOSIS — Z8249 Family history of ischemic heart disease and other diseases of the circulatory system: Secondary | ICD-10-CM | POA: Insufficient documentation

## 2016-11-23 DIAGNOSIS — O219 Vomiting of pregnancy, unspecified: Secondary | ICD-10-CM

## 2016-11-23 DIAGNOSIS — Z841 Family history of disorders of kidney and ureter: Secondary | ICD-10-CM | POA: Insufficient documentation

## 2016-11-23 DIAGNOSIS — Z833 Family history of diabetes mellitus: Secondary | ICD-10-CM | POA: Insufficient documentation

## 2016-11-23 DIAGNOSIS — O161 Unspecified maternal hypertension, first trimester: Secondary | ICD-10-CM | POA: Insufficient documentation

## 2016-11-23 DIAGNOSIS — Z8619 Personal history of other infectious and parasitic diseases: Secondary | ICD-10-CM | POA: Insufficient documentation

## 2016-11-23 DIAGNOSIS — J45909 Unspecified asthma, uncomplicated: Secondary | ICD-10-CM | POA: Insufficient documentation

## 2016-11-23 DIAGNOSIS — O26891 Other specified pregnancy related conditions, first trimester: Secondary | ICD-10-CM | POA: Diagnosis not present

## 2016-11-23 DIAGNOSIS — Z3A01 Less than 8 weeks gestation of pregnancy: Secondary | ICD-10-CM

## 2016-11-23 DIAGNOSIS — O99511 Diseases of the respiratory system complicating pregnancy, first trimester: Secondary | ICD-10-CM | POA: Insufficient documentation

## 2016-11-23 DIAGNOSIS — O210 Mild hyperemesis gravidarum: Secondary | ICD-10-CM | POA: Insufficient documentation

## 2016-11-23 LAB — WET PREP, GENITAL
CLUE CELLS WET PREP: NONE SEEN
SPERM: NONE SEEN
Trich, Wet Prep: NONE SEEN
Yeast Wet Prep HPF POC: NONE SEEN

## 2016-11-23 LAB — URINALYSIS, ROUTINE W REFLEX MICROSCOPIC
GLUCOSE, UA: NEGATIVE mg/dL
Hgb urine dipstick: NEGATIVE
Ketones, ur: 5 mg/dL — AB
NITRITE: NEGATIVE
PH: 6 (ref 5.0–8.0)
Protein, ur: 100 mg/dL — AB
Specific Gravity, Urine: 1.027 (ref 1.005–1.030)

## 2016-11-23 MED ORDER — SODIUM CHLORIDE 0.9 % IV SOLN
25.0000 mg | Freq: Once | INTRAVENOUS | Status: AC
  Administered 2016-11-23: 25 mg via INTRAVENOUS
  Filled 2016-11-23: qty 1

## 2016-11-23 MED ORDER — DOXYLAMINE-PYRIDOXINE 10-10 MG PO TBEC: DELAYED_RELEASE_TABLET | ORAL | 6 refills | Status: DC

## 2016-11-23 NOTE — MAU Note (Signed)
Pt presents to MAU with complaints of lower abdominal cramping, white vaginal discharge, with severe nausea and vomiting. PT states she was given zofran and it does not help with her nausea

## 2016-11-23 NOTE — MAU Note (Signed)
C/o N&V for past week; c/o abdominal pain for past 2 days; c/o vaginal discharge for past 3 days;

## 2016-11-23 NOTE — Discharge Instructions (Signed)

## 2016-11-23 NOTE — MAU Provider Note (Signed)
History     CSN: [id number]  Arrival date and time: 11/23/16 1604   None     Chief Complaint  Patient presents with  . Abdominal Pain  . Morning Sickness  . Vaginal Discharge   HPI 31 yo G4P2012 at 711w1d by LMP presenting today for the evaluation of a 1 week history of nausea and emesis, onset of lower abdominal pain for the past 2-3 days and the presence of a pruritic odorless white discharge for the past 3 days. Patient has been able to tolerate liquids over the past week. She is scheduled to start prenatal care with [facility] this month. Patient denies any fever/chills or vaginal bleeding    Past Medical History:  Diagnosis Date  . Asthma   . Chlamydia 2005  . Environmental allergies   . Hypertension     Past Surgical History:  Procedure Laterality Date  . NO PAST SURGERIES      Family History  Problem Relation Age of Onset  . Diabetes Father   . Hypertension Father   . Kidney failure Father   . Anesthesia problems Neg Hx     Social History  Substance Use Topics  . Smoking status: Never Smoker  . Smokeless tobacco: Never Used  . Alcohol use 1.8 oz/week    3 Standard drinks or equivalent per week     Comment: occasionally    Allergies: No Known Allergies  Prescriptions Prior to Admission  Medication Sig Dispense Refill Last Dose  . albuterol (PROVENTIL HFA;VENTOLIN HFA) 108 (90 BASE) MCG/ACT inhaler Inhale 1-2 puffs into the lungs every 6 (six) hours as needed for wheezing or shortness of breath. 1 Inhaler 0 11/22/2016  . cetirizine (ZYRTEC) 10 MG chewable tablet Chew 10 mg by mouth daily.   11/22/2016  . norgestimate-ethinyl estradiol (MONO-LINYAH) 0.25-35 MG-MCG tablet Take 1 tablet by mouth daily. 28 tablet 12 11/22/2016  . prenatal vitamin w/FE, FA (PRENATAL 1 + 1) 27-1 MG TABS tablet Take 1 tablet by mouth daily at 12 noon. (Patient taking differently: Take 1 tablet by mouth daily at 12 noon. ) 30 each 0 11/22/2016  . tetrahydrozoline-zinc (VISINE-AC)  0.05-0.25 % ophthalmic solution Place 2 drops into both eyes 3 (three) times daily as needed (for dryness/redness).   11/22/2016  . doxycycline (VIBRAMYCIN) 100 MG capsule Take 1 capsule (100 mg total) by mouth 2 (two) times daily. (Patient not taking: Reported on 11/23/2016) 28 capsule 0 Completed Course at Unknown time  . metroNIDAZOLE (FLAGYL) 500 MG tablet Take 1 tablet (500 mg total) by mouth 2 (two) times daily. (Patient not taking: Reported on 11/23/2016) 14 tablet 0 Completed Course at Unknown time    Review of Systems  See pertinent in HPI Physical Exam   Blood pressure 135/84, pulse 65, temperature 98.3 F (36.8 C), resp. rate 18, height 5\' 5"  (1.651 m), weight 233 lb (105.7 kg), last menstrual period 10/04/2016.  Physical Exam GENERAL: Well-developed, well-nourished female in no acute distress.  LUNGS: Clear to auscultation bilaterally.  HEART: Regular rate and rhythm. ABDOMEN: Soft, nontender, nondistended. No organomegaly. PELVIC: Normal external female genitalia. Vagina is pink and rugated.  Normal discharge. Normal appearing cervix. Uterus is normal in size. No adnexal mass or tenderness. EXTREMITIES: No cyanosis, clubbing, or edema, 2+ distal pulses.  MAU Course  Procedures  MDM UA- mod leuk, neg nitrite Wet prep- neg Bedside ultrasound- IUP with yolk sac, no clear fetal pole IVF with phenergan- Patient reports significant improvement in her symptoms and tolerated solid foods  Assessment and Plan  31 yo G4P2012 at 7w by LMP with nausea/vomiting of pregnancy - Results reviewed with the patient - Patient will be contacted with abnormal results - Advised to stay hydrated - Rx Diclegis provided - Follow up as scheduled to initiate prenatal care  Tyla Burgner 11/23/2016, 6:57 PM

## 2016-11-24 LAB — CULTURE, OB URINE

## 2016-11-24 LAB — GC/CHLAMYDIA PROBE AMP (~~LOC~~) NOT AT ARMC
CHLAMYDIA, DNA PROBE: NEGATIVE
NEISSERIA GONORRHEA: NEGATIVE

## 2016-12-11 ENCOUNTER — Inpatient Hospital Stay (HOSPITAL_COMMUNITY)
Admission: AD | Admit: 2016-12-11 | Discharge: 2016-12-11 | Disposition: A | Discharge status: Home / Self Care | Payer: BLUE CROSS/BLUE SHIELD | Source: Ambulatory Visit | Attending: Obstetrics and Gynecology | Admitting: Obstetrics and Gynecology

## 2016-12-11 ENCOUNTER — Inpatient Hospital Stay (HOSPITAL_COMMUNITY): Payer: BLUE CROSS/BLUE SHIELD

## 2016-12-11 ENCOUNTER — Encounter (HOSPITAL_COMMUNITY): Payer: Self-pay | Admitting: *Deleted

## 2016-12-11 DIAGNOSIS — O99512 Diseases of the respiratory system complicating pregnancy, second trimester: Secondary | ICD-10-CM | POA: Diagnosis not present

## 2016-12-11 DIAGNOSIS — O26891 Other specified pregnancy related conditions, first trimester: Secondary | ICD-10-CM | POA: Insufficient documentation

## 2016-12-11 DIAGNOSIS — O26899 Other specified pregnancy related conditions, unspecified trimester: Secondary | ICD-10-CM

## 2016-12-11 DIAGNOSIS — Z833 Family history of diabetes mellitus: Secondary | ICD-10-CM | POA: Diagnosis not present

## 2016-12-11 DIAGNOSIS — B9689 Other specified bacterial agents as the cause of diseases classified elsewhere: Secondary | ICD-10-CM

## 2016-12-11 DIAGNOSIS — R102 Pelvic and perineal pain: Secondary | ICD-10-CM | POA: Insufficient documentation

## 2016-12-11 DIAGNOSIS — O162 Unspecified maternal hypertension, second trimester: Secondary | ICD-10-CM | POA: Insufficient documentation

## 2016-12-11 DIAGNOSIS — Z8249 Family history of ischemic heart disease and other diseases of the circulatory system: Secondary | ICD-10-CM | POA: Diagnosis not present

## 2016-12-11 DIAGNOSIS — O23599 Infection of other part of genital tract in pregnancy, unspecified trimester: Secondary | ICD-10-CM

## 2016-12-11 DIAGNOSIS — R109 Unspecified abdominal pain: Secondary | ICD-10-CM

## 2016-12-11 DIAGNOSIS — N760 Acute vaginitis: Secondary | ICD-10-CM | POA: Insufficient documentation

## 2016-12-11 DIAGNOSIS — J45909 Unspecified asthma, uncomplicated: Secondary | ICD-10-CM | POA: Insufficient documentation

## 2016-12-11 DIAGNOSIS — Z8619 Personal history of other infectious and parasitic diseases: Secondary | ICD-10-CM | POA: Diagnosis not present

## 2016-12-11 DIAGNOSIS — Z841 Family history of disorders of kidney and ureter: Secondary | ICD-10-CM | POA: Diagnosis not present

## 2016-12-11 DIAGNOSIS — R1031 Right lower quadrant pain: Secondary | ICD-10-CM | POA: Diagnosis not present

## 2016-12-11 DIAGNOSIS — Z3491 Encounter for supervision of normal pregnancy, unspecified, first trimester: Secondary | ICD-10-CM

## 2016-12-11 DIAGNOSIS — Z3A09 9 weeks gestation of pregnancy: Secondary | ICD-10-CM | POA: Diagnosis not present

## 2016-12-11 LAB — CBC
HCT: 34.3 % — ABNORMAL LOW (ref 36.0–46.0)
HEMOGLOBIN: 12.2 g/dL (ref 12.0–15.0)
MCH: 30.5 pg (ref 26.0–34.0)
MCHC: 35.6 g/dL (ref 30.0–36.0)
MCV: 85.8 fL (ref 78.0–100.0)
Platelets: 272 10*3/uL (ref 150–400)
RBC: 4 MIL/uL (ref 3.87–5.11)
RDW: 13.3 % (ref 11.5–15.5)
WBC: 9 10*3/uL (ref 4.0–10.5)

## 2016-12-11 LAB — URINALYSIS, ROUTINE W REFLEX MICROSCOPIC
BACTERIA UA: NONE SEEN
Glucose, UA: NEGATIVE mg/dL
Hgb urine dipstick: NEGATIVE
Ketones, ur: 20 mg/dL — AB
Leukocytes, UA: NEGATIVE
Nitrite: NEGATIVE
Protein, ur: 30 mg/dL — AB
SPECIFIC GRAVITY, URINE: 1.032 — AB (ref 1.005–1.030)
pH: 5 (ref 5.0–8.0)

## 2016-12-11 LAB — WET PREP, GENITAL
Sperm: NONE SEEN
Trich, Wet Prep: NONE SEEN
YEAST WET PREP: NONE SEEN

## 2016-12-11 MED ORDER — METRONIDAZOLE 500 MG PO TABS: 500.0000 mg | ORAL_TABLET | Freq: Two times a day (BID) | ORAL | 0 refills | Status: DC

## 2016-12-11 MED ORDER — IBUPROFEN 600 MG PO TABS
600.0000 mg | ORAL_TABLET | Freq: Once | ORAL | Status: AC
  Administered 2016-12-11: 600 mg via ORAL
  Filled 2016-12-11: qty 1

## 2016-12-11 NOTE — Discharge Instructions (Signed)
Abdominal Pain During Pregnancy °Belly (abdominal) pain is common during pregnancy. Most of the time, it is not a serious problem. Other times, it can be a sign that something is wrong with the pregnancy. Always tell your doctor if you have belly pain. °Follow these instructions at home: °Monitor your belly pain for any changes. The following actions may help you feel better: °· Do not have sex (intercourse) or put anything in your vagina until you feel better. °· Rest until your pain stops. °· Drink clear fluids if you feel sick to your stomach (nauseous). Do not eat solid food until you feel better. °· Only take medicine as told by your doctor. °· Keep all doctor visits as told. °Get help right away if: °· You are bleeding, leaking fluid, or pieces of tissue come out of your vagina. °· You have more pain or cramping. °· You keep throwing up (vomiting). °· You have pain when you pee (urinate) or have blood in your pee. °· You have a fever. °· You do not feel your baby moving as much. °· You feel very weak or feel like passing out. °· You have trouble breathing, with or without belly pain. °· You have a very bad headache and belly pain. °· You have fluid leaking from your vagina and belly pain. °· You keep having watery poop (diarrhea). °· Your belly pain does not go away after resting, or the pain gets worse. °This information is not intended to replace advice given to you by your health care provider. Make sure you discuss any questions you have with your health care provider. °Document Released: 10/08/2009 Document Revised: 05/28/2016 Document Reviewed: 05/19/2013 °Elsevier Interactive Patient Education © 2017 [employer] ° °Bacterial Vaginosis °Bacterial vaginosis is an infection of the vagina. It happens when too many germs (bacteria) grow in the vagina. This infection puts you at risk for infections from sex (STIs). Treating this infection can lower your risk for some STIs. You should also treat this if you  are pregnant. It can cause your baby to be born early. °Follow these instructions at home: °Medicines °· Take over-the-counter and prescription medicines only as told by your doctor. °· Take or use your antibiotic medicine as told by your doctor. Do not stop taking or using it even if you start to feel better. °General instructions °· If you your sexual partner is a woman, tell her that you have this infection. She needs to get treatment if she has symptoms. If you have a female partner, he does not need to be treated. °· During treatment: °¨ Avoid sex. °¨ Do not douche. °¨ Avoid alcohol as told. °¨ Avoid breastfeeding as told. °· Drink enough fluid to keep your pee (urine) clear or pale yellow. °· Keep your vagina and butt (rectum) clean. °¨ Wash the area with warm water every day. °¨ Wipe from front to back after you use the toilet. °· Keep all follow-up visits as told by your doctor. This is important. °Preventing this condition °· Do not douche. °· Use only warm water to wash around your vagina. °· Use protection when you have sex. This includes: °¨ Latex condoms. °¨ Dental dams. °· Limit how many people you have sex with. It is best to only have sex with the same person (be monogamous). °· Get tested for STIs. Have your partner get tested. °· Wear underwear that is cotton or lined with cotton. °· Avoid tight pants and pantyhose. This is most important in summer. °·   Do not use any products that have nicotine or tobacco in them. These include cigarettes and e-cigarettes. If you need help quitting, ask your doctor. °· Do not use illegal drugs. °· Limit how much alcohol you drink. °Contact a doctor if: °· Your symptoms do not get better, even after you are treated. °· You have more discharge or pain when you pee (urinate). °· You have a fever. °· You have pain in your belly (abdomen). °· You have pain with sex. °· Your bleed from your vagina between periods. °Summary °· This infection happens when too many germs  (bacteria) grow in the vagina. °· Treating this condition can lower your risk for some infections from sex (STIs). °· You should also treat this if you are pregnant. It can cause early (premature) birth. °· Do not stop taking or using your antibiotic medicine even if you start to feel better. °This information is not intended to replace advice given to you by your health care provider. Make sure you discuss any questions you have with your health care provider. °Document Released: 07/29/2008 Document Revised: 07/05/2016 Document Reviewed: 07/05/2016 °Elsevier Interactive Patient Education © 2017 [employer] Inc[employer] ° °

## 2016-12-11 NOTE — MAU Note (Signed)
Pt reports she has  Sharp pain in her lower abd that started earlier today.took tylenol without releif. Denies any vaginal bleeding. Reports some tan discharge.

## 2016-12-11 NOTE — MAU Provider Note (Signed)
History     CSN: [id number]  Arrival date and time: 12/11/16 1630   First Provider Initiated Contact with Patient 12/11/16 1926       Chief Complaint  Patient presents with  . Abdominal Pain   HPI  Amanda Melton is a 31 y.o. [id number] at 109w5d who presents with abdominal pain. Reports sudden onset lower abdominal pain that started this morning. Worse in RLQ. Describes as constant sharp pain that has gradually worsened throughout the day. Took tylenol 650 mg this afternoon without relief. Nothing makes better or worse. Rates pain 8/10.  Some nausea & vomiting throughout pregnancy. Has vomited twice today. Is taking diclegis BID for symptoms. Vaginal discharge x 2 days. Tan/brown discharge with foul odor.  Denies vaginal bleeding, recent intercourse, dysuria, diarrhea/constipation, fever/chills, or anorexia.   OB History    Gravida Para Term Preterm AB Living   4 2 2  0 1 2   SAB TAB Ectopic Multiple Live Births   1 0 0 0 2      Past Medical History:  Diagnosis Date  . Asthma   . Chlamydia 2005  . Environmental allergies   . Hypertension     Past Surgical History:  Procedure Laterality Date  . NO PAST SURGERIES      Family History  Problem Relation Age of Onset  . Diabetes Father   . Hypertension Father   . Kidney failure Father   . Anesthesia problems Neg Hx     Social History  Substance Use Topics  . Smoking status: Never Smoker  . Smokeless tobacco: Never Used  . Alcohol use 1.8 oz/week    3 Standard drinks or equivalent per week     Comment: occasionally    Allergies: No Known Allergies  Prescriptions Prior to Admission  Medication Sig Dispense Refill Last Dose  . albuterol (PROVENTIL HFA;VENTOLIN HFA) 108 (90 BASE) MCG/ACT inhaler Inhale 1-2 puffs into the lungs every 6 (six) hours as needed for wheezing or shortness of breath. 1 Inhaler 0 11/22/2016  . cetirizine (ZYRTEC) 10 MG chewable tablet Chew 10 mg by mouth daily.   11/22/2016  . Doxylamine-Pyridoxine  10-10 MG TBEC Take 2 tabs at bedtime. If symptoms persist after 2 days, take 1 tab in the morning and 2 tabs at bedtime 100 tablet 6   . prenatal vitamin w/FE, FA (PRENATAL 1 + 1) 27-1 MG TABS tablet Take 1 tablet by mouth daily at 12 noon. (Patient taking differently: Take 1 tablet by mouth daily at 12 noon. ) 30 each 0 11/22/2016  . tetrahydrozoline-zinc (VISINE-AC) 0.05-0.25 % ophthalmic solution Place 2 drops into both eyes 3 (three) times daily as needed (for dryness/redness).   11/22/2016    Review of Systems  Constitutional: Negative for appetite change, chills and fever.  Gastrointestinal: Positive for abdominal pain, nausea and vomiting. Negative for blood in stool, constipation and diarrhea.  Genitourinary: Positive for vaginal discharge. Negative for dysuria, hematuria, vaginal bleeding and vaginal pain.   Physical Exam   Blood pressure 138/70, pulse 81, temperature 98.6 F (37 C), resp. rate 18, height 5\' 5"  (1.651 m), weight 231 lb 1.3 oz (104.8 kg), last menstrual period 10/04/2016.  Physical Exam  Nursing note and vitals reviewed. Constitutional: She is oriented to person, place, and time. She appears well-developed and well-nourished. No distress.  HENT:  Head: Normocephalic and atraumatic.  Eyes: Conjunctivae are normal. Right eye exhibits no discharge. Left eye exhibits no discharge. No scleral icterus.  Neck: Normal range of  motion.  Cardiovascular: Normal rate, regular rhythm and normal heart sounds.   No murmur heard. Respiratory: Effort normal and breath sounds normal. No respiratory distress. She has no wheezes.  GI: Soft. Bowel sounds are normal. She exhibits no distension. There is no tenderness. There is no rebound and no guarding.  Genitourinary: Uterus normal. Cervix exhibits friability. Cervix exhibits no motion tenderness. Right adnexum displays no mass and no tenderness. Left adnexum displays no mass and no tenderness. No bleeding in the vagina. Vaginal  discharge (small amount of malodorous tan discharge) found.  Genitourinary Comments: Cervix closed  Neurological: She is alert and oriented to person, place, and time.  Skin: Skin is warm and dry. She is not diaphoretic.  Psychiatric: She has a normal mood and affect. Her behavior is normal. Judgment and thought content normal.    MAU Course  Procedures Results for orders placed or performed during the hospital encounter of 12/11/16 (from the past 24 hour(s))  Urinalysis, Routine w reflex microscopic     Status: Abnormal   Collection Time: 12/11/16  5:00 PM  Result Value Ref Range   Color, Urine AMBER (A) YELLOW   APPearance HAZY (A) CLEAR   Specific Gravity, Urine 1.032 (H) 1.005 - 1.030   pH 5.0 5.0 - 8.0   Glucose, UA NEGATIVE NEGATIVE mg/dL   Hgb urine dipstick NEGATIVE NEGATIVE   Bilirubin Urine SMALL (A) NEGATIVE   Ketones, ur 20 (A) NEGATIVE mg/dL   Protein, ur 30 (A) NEGATIVE mg/dL   Nitrite NEGATIVE NEGATIVE   Leukocytes, UA NEGATIVE NEGATIVE   RBC / HPF 0-5 0 - 5 RBC/hpf   WBC, UA 0-5 0 - 5 WBC/hpf   Bacteria, UA NONE SEEN NONE SEEN   Squamous Epithelial / LPF 0-5 (A) NONE SEEN   Mucous PRESENT   CBC     Status: Abnormal   Collection Time: 12/11/16  5:13 PM  Result Value Ref Range   WBC 9.0 4.0 - 10.5 K/uL   RBC 4.00 3.87 - 5.11 MIL/uL   Hemoglobin 12.2 12.0 - 15.0 g/dL   HCT 40.934.3 (L) 81.136.0 - 91.446.0 %   MCV 85.8 78.0 - 100.0 fL   MCH 30.5 26.0 - 34.0 pg   MCHC 35.6 30.0 - 36.0 g/dL   RDW 78.213.3 95.611.5 - 21.315.5 %   Platelets 272 150 - 400 K/uL  Wet prep, genital     Status: Abnormal   Collection Time: 12/11/16  5:22 PM  Result Value Ref Range   Yeast Wet Prep HPF POC NONE SEEN NONE SEEN   Trich, Wet Prep NONE SEEN NONE SEEN   Clue Cells Wet Prep HPF POC PRESENT (A) NONE SEEN   WBC, Wet Prep HPF POC MANY (A) NONE SEEN   Sperm NONE SEEN    [place] Ob Comp Less 14 Wks  Result Date: 12/11/2016 CLINICAL DATA:  Pregnant patient with sharp right lower quadrant abdominal pain.  EXAM: OBSTETRIC <14 WK US AND TRANSVAGINAL OB US TECHNIQUE: Both transabdominal and transvaginal ultrasound examinations were performed for complete evaluation of the gestation as well as the maternal uterus, adnexal regions, and pelvic cul-de-sac. Transvaginal technique was performed to assess early pregnancy. COMPARISON:  Pelvic ultrasound 12/09/2013. FINDINGS: Intrauterine gestational sac: Single Yolk sac:  Visualized. Embryo:  Visualized. Cardiac Activity: Visualized. Heart Rate: 166  bpm CRL:  24.8  mm   9 w   1 d                  [place]  EDC: 07/15/2017 Subchorionic hemorrhage:  None visualized. Maternal uterus/adnexae: Small fibroid within the anterior uterine fundus measuring 1.5 x 1.2 x 1.3 cm. Probable corpus luteum within the right ovary. Normal left ovary. Trace free fluid in the pelvis. IMPRESSION: Single live intrauterine gestation.  No subchorionic hemorrhage. Electronically Signed   By: Annia Belt M.D.   On: 12/11/2016 18:43   US Ob Transvaginal  Result Date: 12/11/2016 CLINICAL DATA:  Pregnant patient with sharp right lower quadrant abdominal pain. EXAM: OBSTETRIC <14 WK [place] AND TRANSVAGINAL OB US TECHNIQUE: Both transabdominal and transvaginal ultrasound examinations were performed for complete evaluation of the gestation as well as the maternal uterus, adnexal regions, and pelvic cul-de-sac. Transvaginal technique was performed to assess early pregnancy. COMPARISON:  Pelvic ultrasound 12/09/2013. FINDINGS: Intrauterine gestational sac: Single Yolk sac:  Visualized. Embryo:  Visualized. Cardiac Activity: Visualized. Heart Rate: 166  bpm CRL:  24.8  mm   9 w   1 d                  [place] EDC: 07/15/2017 Subchorionic hemorrhage:  None visualized. Maternal uterus/adnexae: Small fibroid within the anterior uterine fundus measuring 1.5 x 1.2 x 1.3 cm. Probable corpus luteum within the right ovary. Normal left ovary. Trace free fluid in the pelvis. IMPRESSION: Single live intrauterine gestation.  No  subchorionic hemorrhage. Electronically Signed   By: Annia Belt M.D.   On: 12/11/2016 18:43    MDM CBC - no leukocytosis VSS, NAD Abdomen soft & nontender Ultrasound shows SIUP with cardiac activity; small fibroid 1.5cm; right CLC Wet prep + clue cells Assessment and Plan  A; 1. Normal IUP (intrauterine pregnancy) on prenatal ultrasound, first trimester   2. Abdominal pain affecting pregnancy   3. BV (bacterial vaginosis)    P: Discharge home Rx flagyl GC/CT pending Discussed reasons to return to MAU Increase fluid intake  Judeth Horn 12/11/2016, 5:13 PM

## 2016-12-12 LAB — GC/CHLAMYDIA PROBE AMP (~~LOC~~) NOT AT ARMC
CHLAMYDIA, DNA PROBE: NEGATIVE
NEISSERIA GONORRHEA: NEGATIVE

## 2016-12-16 ENCOUNTER — Telehealth: Payer: Self-pay

## 2016-12-16 NOTE — Telephone Encounter (Signed)
TC from pt c/o of feeling tired and concerned the PNV gummies she is taking is not sufficient enough for her and the baby. She was told to take gummy because tabs was causing N&V. Recent CBC normal. Informed pt Diclegis she is taking for n&v may cause drowsiness.  Advised pt to continue to eat healthy meals, continue to take the gummies daily until n&v subsides, and to get some rest.

## 2016-12-22 ENCOUNTER — Ambulatory Visit (INDEPENDENT_AMBULATORY_CARE_PROVIDER_SITE_OTHER): Payer: BLUE CROSS/BLUE SHIELD | Admitting: Obstetrics

## 2016-12-22 ENCOUNTER — Encounter: Payer: Self-pay | Admitting: Obstetrics

## 2016-12-22 VITALS — BP 137/88 | HR 76 | Wt 231.6 lb

## 2016-12-22 DIAGNOSIS — Z3481 Encounter for supervision of other normal pregnancy, first trimester: Secondary | ICD-10-CM

## 2016-12-22 DIAGNOSIS — Z3490 Encounter for supervision of normal pregnancy, unspecified, unspecified trimester: Secondary | ICD-10-CM | POA: Insufficient documentation

## 2016-12-22 DIAGNOSIS — Z1151 Encounter for screening for human papillomavirus (HPV): Secondary | ICD-10-CM

## 2016-12-22 DIAGNOSIS — Z3A11 11 weeks gestation of pregnancy: Secondary | ICD-10-CM

## 2016-12-22 DIAGNOSIS — Z124 Encounter for screening for malignant neoplasm of cervix: Secondary | ICD-10-CM

## 2016-12-22 DIAGNOSIS — Z3480 Encounter for supervision of other normal pregnancy, unspecified trimester: Secondary | ICD-10-CM

## 2016-12-22 MED ORDER — VITAFOL GUMMIES 3.33-0.333-34.8 MG PO CHEW: 3.0000 | CHEWABLE_TABLET | Freq: Every day | ORAL | 11 refills | Status: AC

## 2016-12-22 NOTE — Telephone Encounter (Signed)
error 

## 2016-12-22 NOTE — Addendum Note (Signed)
Addended by: Coral CeoHARPER, CHARLES A on: 12/22/2016 03:52 PM   Modules accepted: Orders

## 2016-12-22 NOTE — Progress Notes (Signed)
Subjective:  Amanda Melton is a 31 y.o. [id number] at 7374w2d being seen today for ongoing prenatal care.  She is currently monitored for the following issues for this low-risk pregnancy and has Postpartum hemorrhage; Breakthrough bleeding on depo provera; Excessive or frequent menstruation; Vaginitis and vulvovaginitis, unspecified; Abnormal uterine bleeding (AUB); Obesity; and Supervision of normal pregnancy, antepartum on her problem list.  Patient reports no complaints.  Contractions: Not present. Vag. Bleeding: None.   . Denies leaking of fluid.   The following portions of the patient's history were reviewed and updated as appropriate: allergies, current medications, past family history, past medical history, past social history, past surgical history and problem list. Problem list updated.  Objective:   Vitals:   12/22/16 1458  BP: 137/88  Pulse: 76  Weight: 231 lb 9.6 oz (105.1 kg)    Fetal Status:           General:  Alert, oriented and cooperative. Patient is in no acute distress.  Skin: Skin is warm and dry. No rash noted.   Cardiovascular: Normal heart rate noted  Respiratory: Normal respiratory effort, no problems with respiration noted  Abdomen: Soft, gravid, appropriate for gestational age. Pain/Pressure: Absent     Pelvic:  Cervical exam deferred        Extremities: Normal range of motion.  Edema: None  Mental Status: Normal mood and affect. Normal behavior. Normal judgment and thought content.   Urinalysis:      Assessment and Plan:  Pregnancy: G4P2012 at 574w2d  1. [redacted] weeks gestation of pregnancy Rx: - Cytology - PAP - HIV antibody - Hemoglobinopathy evaluation - Culture, OB Urine - Prenatal Profile I  Preterm labor symptoms and general obstetric precautions including but not limited to vaginal bleeding, contractions, leaking of fluid and fetal movement were reviewed in detail with the patient. Please refer to After Visit Summary for other counseling  recommendations.  F/U 4 weeks   Brock Badharles A Endora Teresi, MDPatient ID: Amanda Melton, female   DOB: 1986/07/13, 31 y.o.   MRN: [id number] Patient ID: Amanda Melton, female   DOB: 1986/07/13, 31 y.o.   MRN: [id number]

## 2016-12-22 NOTE — Progress Notes (Signed)
Patient reports she is doing well 

## 2016-12-24 LAB — CULTURE, OB URINE

## 2016-12-24 LAB — URINE CULTURE, OB REFLEX

## 2016-12-25 LAB — PRENATAL PROFILE I(LABCORP)
Antibody Screen: NEGATIVE
Basophils Absolute: 0.1 10*3/uL (ref 0.0–0.2)
Basos: 1 %
EOS (ABSOLUTE): 0.4 10*3/uL (ref 0.0–0.4)
Eos: 4 %
HEMOGLOBIN: 12.4 g/dL (ref 11.1–15.9)
HEP B S AG: NEGATIVE
Hematocrit: 36.8 % (ref 34.0–46.6)
IMMATURE GRANS (ABS): 0 10*3/uL (ref 0.0–0.1)
IMMATURE GRANULOCYTES: 0 %
LYMPHS: 32 %
Lymphocytes Absolute: 2.9 10*3/uL (ref 0.7–3.1)
MCH: 30.1 pg (ref 26.6–33.0)
MCHC: 33.7 g/dL (ref 31.5–35.7)
MCV: 89 fL (ref 79–97)
MONOS ABS: 1.3 10*3/uL — AB (ref 0.1–0.9)
Monocytes: 14 %
NEUTROS PCT: 49 %
Neutrophils Absolute: 4.5 10*3/uL (ref 1.4–7.0)
Platelets: 293 10*3/uL (ref 150–379)
RBC: 4.12 x10E6/uL (ref 3.77–5.28)
RDW: 14.2 % (ref 12.3–15.4)
RH TYPE: POSITIVE
RPR: NONREACTIVE
Rubella Antibodies, IGG: 1.71 index (ref >0.99)
WBC: 9.1 10*3/uL (ref 3.4–10.8)

## 2016-12-25 LAB — HEMOGLOBINOPATHY EVALUATION
HEMOGLOBIN A2 QUANTITATION: 2.7 % (ref 1.8–3.2)
HGB C: 0 %
HGB S: 0 %
HGB VARIANT: 0 %
Hemoglobin F Quantitation: 0 % (ref 0.0–2.0)
Hgb A: 97.3 % (ref 96.4–98.8)

## 2016-12-25 LAB — HIV ANTIBODY (ROUTINE TESTING W REFLEX): HIV Screen 4th Generation wRfx: NONREACTIVE

## 2016-12-26 LAB — CYTOLOGY - PAP
DIAGNOSIS: NEGATIVE
HPV (WINDOPATH): NOT DETECTED

## 2017-01-09 ENCOUNTER — Ambulatory Visit: Payer: BC Managed Care – PPO | Admitting: Certified Nurse Midwife

## 2017-01-09 ENCOUNTER — Telehealth: Payer: Self-pay

## 2017-01-09 NOTE — Progress Notes (Deleted)
31 y.o. [id number] Married  {Race/ethnicity:17218} Fe here for annual exam.    Patient's last menstrual period was 10/04/2016.          Sexually active: {yes no:314532}  The current method of family planning is {contraception:315051}.    Exercising: {yes no:314532}  {types:19826} Smoker:  {YES NO:22349}  Health Maintenance: Pap:  12-18-14 neg, 12-22-16 neg HPV HR neg MMG:  none Colonoscopy:  none BMD:   none TDaP:  UTD Shingles: no Pneumonia: no Hep C and HIV: HIV neg 12/2016 Labs:  Self breast exam:   reports that she has never smoked. She has never used smokeless tobacco. She reports that she does not drink alcohol or use drugs.  Past Medical History:  Diagnosis Date  . Asthma   . Chlamydia 2005  . Environmental allergies   . Hypertension     Past Surgical History:  Procedure Laterality Date  . NO PAST SURGERIES      Current Outpatient Prescriptions  Medication Sig Dispense Refill  . acetaminophen (TYLENOL) 325 MG tablet Take 650 mg by mouth every 6 (six) hours as needed for mild pain or headache.    . albuterol (PROVENTIL HFA;VENTOLIN HFA) 108 (90 BASE) MCG/ACT inhaler Inhale 1-2 puffs into the lungs every 6 (six) hours as needed for wheezing or shortness of breath. 1 Inhaler 0  . cetirizine (ZYRTEC) 10 MG chewable tablet Chew 10 mg by mouth daily.    . Doxylamine-Pyridoxine 10-10 MG TBEC Take 2 tabs at bedtime. If symptoms persist after 2 days, take 1 tab in the morning and 2 tabs at bedtime (Patient taking differently: Take 1-2 tablets by mouth 2 (two) times daily. Take 1 tab in the morning and 2 tabs at bedtime) 100 tablet 6  . Prenatal Vit-Fe Fum-FA-Omega (ONE-A-DAY WOMENS PRENATAL PO) Take by mouth.    . Prenatal Vit-Fe Phos-FA-Omega (VITAFOL GUMMIES) 3.33-0.333-34.8 MG CHEW Chew 3 tablets by mouth daily before breakfast. 90 tablet 11  . prenatal vitamin w/FE, FA (PRENATAL 1 + 1) 27-1 MG TABS tablet Take 1 tablet by mouth daily at 12 noon. (Patient taking differently:  Take 1 tablet by mouth daily at 12 noon. ) 30 each 0  . tetrahydrozoline-zinc (VISINE-AC) 0.05-0.25 % ophthalmic solution Place 2 drops into both eyes 3 (three) times daily as needed (for dryness/redness).     No current facility-administered medications for this visit.     Family History  Problem Relation Age of Onset  . Diabetes Father   . Hypertension Father   . Kidney failure Father   . Anesthesia problems Neg Hx     ROS:  Pertinent items are noted in HPI.  Otherwise, a comprehensive ROS was negative.  Exam:   LMP 10/04/2016    Ht Readings from Last 3 Encounters:  12/11/16  (1.651 m)  11/23/16  (1.651 m)  10/28/16  (1.651 m)    General appearance: alert, cooperative and appears stated age Head: Normocephalic, without obvious abnormality, atraumatic Neck: no adenopathy, supple, symmetrical, trachea midline and thyroid {EXAM; THYROID:18604} Lungs: clear to auscultation bilaterally Breasts: {Exam; breast:13139::"normal appearance, no masses or tenderness"} Heart: regular rate and rhythm Abdomen: soft, non-tender; no masses,  no organomegaly Extremities: extremities normal, atraumatic, no cyanosis or edema Skin: Skin color, texture, turgor normal. No rashes or lesions Lymph nodes: Cervical, supraclavicular, and axillary nodes normal. No abnormal inguinal nodes palpated Neurologic: Grossly normal   Pelvic: External genitalia:  no lesions  Urethra:  normal appearing urethra with no masses, tenderness or lesions              Bartholin's and Skene's: normal                 Vagina: normal appearing vagina with normal color and discharge, no lesions              Cervix: {exam; cervix:14595}              Pap taken: {yes no:314532} Bimanual Exam:  Uterus:  {exam; uterus:12215}              Adnexa: {exam; adnexa:12223}               Rectovaginal: Confirms               Anus:  normal sphincter tone, no lesions  Chaperone present: ***  A:  Well Woman  with normal exam  P:   Reviewed health and wellness pertinent to exam  Pap smear as above  {plan; gyn:5269::"mammogram","pap smear","return annually or prn"}  An After Visit Summary was printed and given to the patient.

## 2017-01-09 NOTE — Telephone Encounter (Signed)
Called patient & left message for her letting her know that she does not need aex appt scheduled for today due to her being pregnant. Her obgyn should be following her care. Pt told if any questions or concerns to call office back.

## 2017-01-19 ENCOUNTER — Encounter: Payer: Self-pay | Admitting: Obstetrics

## 2017-01-19 ENCOUNTER — Ambulatory Visit (INDEPENDENT_AMBULATORY_CARE_PROVIDER_SITE_OTHER): Payer: BLUE CROSS/BLUE SHIELD | Admitting: Obstetrics

## 2017-01-19 VITALS — BP 127/85 | HR 91 | Wt 234.0 lb

## 2017-01-19 DIAGNOSIS — M545 Low back pain, unspecified: Secondary | ICD-10-CM

## 2017-01-19 DIAGNOSIS — O26892 Other specified pregnancy related conditions, second trimester: Secondary | ICD-10-CM | POA: Diagnosis not present

## 2017-01-19 DIAGNOSIS — Z3480 Encounter for supervision of other normal pregnancy, unspecified trimester: Secondary | ICD-10-CM

## 2017-01-19 LAB — POCT URINALYSIS DIPSTICK
BILIRUBIN UA: NEGATIVE
Blood, UA: NEGATIVE
GLUCOSE UA: NEGATIVE
Ketones, UA: NEGATIVE
LEUKOCYTES UA: NEGATIVE
Nitrite, UA: NEGATIVE
Protein, UA: NEGATIVE
Spec Grav, UA: 1.03 (ref 1.030–1.035)
Urobilinogen, UA: 0.2 (ref ?–2.0)
pH, UA: 6 (ref 5.0–8.0)

## 2017-01-19 NOTE — Patient Instructions (Signed)
Back Pain in Pregnancy Back pain during pregnancy is common. Back pain may be caused by several factors that are related to changes during your pregnancy. Follow these instructions at home: Managing pain, stiffness, and swelling   If directed, apply ice for sudden (acute) back pain.  Put ice in a plastic bag.  Place a towel between your skin and the bag.  Leave the ice on for 20 minutes, 2-3 times per day.  If directed, apply heat to the affected area before you exercise:  Place a towel between your skin and the heat pack or heating pad.  Leave the heat on for 20-30 minutes.  Remove the heat if your skin turns bright red. This is especially important if you are unable to feel pain, heat, or cold. You may have a greater risk of getting burned. Activity   Exercise as told by your health care provider. Exercising is the best way to prevent or manage back pain.  Listen to your body when lifting. If lifting hurts, ask for help or bend your knees. This uses your leg muscles instead of your back muscles.  Squat down when picking up something from the floor. Do not bend over.  Only use bed rest as told by your health care provider. Bed rest should only be used for the most severe episodes of back pain. Standing, Sitting, and Lying Down   Do not stand in one place for long periods of time.  Use good posture when sitting. Make sure your head rests over your shoulders and is not hanging forward. Use a pillow on your lower back if necessary.  Try sleeping on your side, preferably the left side, with a pillow or two between your legs. If you are sore after a night's rest, your bed may be too soft. A firm mattress may provide more support for your back during pregnancy. General instructions   Do not wear high heels.  Eat a healthy diet. Try to gain weight within your health care provider's recommendations.  Use a maternity girdle, elastic sling, or back brace as told by your health care  provider.  Take over-the-counter and prescription medicines only as told by your health care provider.  Keep all follow-up visits as told by your health care provider. This is important. This includes any visits with any specialists, such as a physical therapist. Contact a health care provider if:  Your back pain interferes with your daily activities.  You have increasing pain in other parts of your body. Get help right away if:  You develop numbness, tingling, weakness, or problems with the use of your arms or legs.  You develop severe back pain that is not controlled with medicine.  You have a sudden change in bowel or bladder control.  You develop shortness of breath, dizziness, or you faint.  You develop nausea, vomiting, or sweating.  You have back pain that is a rhythmic, cramping pain similar to labor pains. Labor pain is usually 1-2 minutes apart, lasts for about 1 minute, and involves a bearing down feeling or pressure in your pelvis.  You have back pain and your water breaks or you have vaginal bleeding.  You have back pain or numbness that travels down your leg.  Your back pain developed after you fell.  You develop pain on one side of your back.  You see blood in your urine.  You develop skin blisters in the area of your back pain. This information is not intended to replace   advice given to you by your health care provider. Make sure you discuss any questions you have with your health care provider. Document Released: 01/28/2006 Document Revised: 03/27/2016 Document Reviewed: 07/04/2015 Elsevier Interactive Patient Education  2017 [employer]  Back Pain in Pregnancy Back pain during pregnancy is common. Back pain may be caused by several factors that are related to changes during your pregnancy. Follow these instructions at home: Managing pain, stiffness, and swelling   If directed, apply ice for sudden (acute) back pain.  Put ice in a plastic  bag.  Place a towel between your skin and the bag.  Leave the ice on for 20 minutes, 2-3 times per day.  If directed, apply heat to the affected area before you exercise:  Place a towel between your skin and the heat pack or heating pad.  Leave the heat on for 20-30 minutes.  Remove the heat if your skin turns bright red. This is especially important if you are unable to feel pain, heat, or cold. You may have a greater risk of getting burned. Activity   Exercise as told by your health care provider. Exercising is the best way to prevent or manage back pain.  Listen to your body when lifting. If lifting hurts, ask for help or bend your knees. This uses your leg muscles instead of your back muscles.  Squat down when picking up something from the floor. Do not bend over.  Only use bed rest as told by your health care provider. Bed rest should only be used for the most severe episodes of back pain. Standing, Sitting, and Lying Down   Do not stand in one place for long periods of time.  Use good posture when sitting. Make sure your head rests over your shoulders and is not hanging forward. Use a pillow on your lower back if necessary.  Try sleeping on your side, preferably the left side, with a pillow or two between your legs. If you are sore after a night's rest, your bed may be too soft. A firm mattress may provide more support for your back during pregnancy. General instructions   Do not wear high heels.  Eat a healthy diet. Try to gain weight within your health care provider's recommendations.  Use a maternity girdle, elastic sling, or back brace as told by your health care provider.  Take over-the-counter and prescription medicines only as told by your health care provider.  Keep all follow-up visits as told by your health care provider. This is important. This includes any visits with any specialists, such as a physical therapist. Contact a health care provider if:  Your  back pain interferes with your daily activities.  You have increasing pain in other parts of your body. Get help right away if:  You develop numbness, tingling, weakness, or problems with the use of your arms or legs.  You develop severe back pain that is not controlled with medicine.  You have a sudden change in bowel or bladder control.  You develop shortness of breath, dizziness, or you faint.  You develop nausea, vomiting, or sweating.  You have back pain that is a rhythmic, cramping pain similar to labor pains. Labor pain is usually 1-2 minutes apart, lasts for about 1 minute, and involves a bearing down feeling or pressure in your pelvis.  You have back pain and your water breaks or you have vaginal bleeding.  You have back pain or numbness that travels down your leg.  Your back  pain developed after you fell.  You develop pain on one side of your back.  You see blood in your urine.  You develop skin blisters in the area of your back pain. This information is not intended to replace advice given to you by your health care provider. Make sure you discuss any questions you have with your health care provider. Document Released: 01/28/2006 Document Revised: 03/27/2016 Document Reviewed: 07/04/2015 Elsevier Interactive Patient Education  2017 [employer].

## 2017-01-19 NOTE — Progress Notes (Signed)
Subjective:  Amanda Melton is a 31 y.o. [phone] at 3757w2d being seen today for ongoing prenatal care.  She is currently monitored for the following issues for this low-risk pregnancy and has Postpartum hemorrhage; Breakthrough bleeding on depo provera; Excessive or frequent menstruation; Vaginitis and vulvovaginitis, unspecified; Abnormal uterine bleeding (AUB); Obesity; and Supervision of normal pregnancy, antepartum on her problem list.  Patient reports backache.  Contractions: Not present. Vag. Bleeding: None.  Movement: Present. Denies leaking of fluid.   The following portions of the patient's history were reviewed and updated as appropriate: allergies, current medications, past family history, past medical history, past social history, past surgical history and problem list. Problem list updated.  Objective:   Vitals:   01/19/17 1430  BP: 127/85  Pulse: 91  Weight: 234 lb (106.1 kg)    Fetal Status:     Movement: Present     General:  Alert, oriented and cooperative. Patient is in no acute distress.  Skin: Skin is warm and dry. No rash noted.   Cardiovascular: Normal heart rate noted  Respiratory: Normal respiratory effort, no problems with respiration noted  Abdomen: Soft, gravid, appropriate for gestational age. Pain/Pressure: Present     Pelvic:  Cervical exam deferred        Extremities: Normal range of motion.  Edema: None  Mental Status: Normal mood and affect. Normal behavior. Normal judgment and thought content.   Urinalysis:      Assessment and Plan:  Pregnancy: [id number] at 3257w2d  1. Supervision of other normal pregnancy, antepartum Rx: - AFP, Quad Screen - US MFM OB COMP + 14 WK; Future  2. Low back pain during pregnancy in second trimester Rx: -Maternity Belt Rx - POCT urinalysis dipstick  Preterm labor symptoms and general obstetric precautions including but not limited to vaginal bleeding, contractions, leaking of fluid and fetal movement were reviewed in  detail with the patient. Please refer to After Visit Summary for other counseling recommendations.  No Follow-up on file.   Brock Badharles A Willmer Fellers, MDPatient ID: Amanda Melton, female   DOB: 1986/07/24, 31 y.o.   MRN: [id number]

## 2017-01-19 NOTE — Progress Notes (Signed)
Pt presents for ROB c/o low back pain. UA neg.

## 2017-01-23 LAB — AFP, QUAD SCREEN
DIA VALUE (EIA): 143.32 pg/mL
DSR (BY AGE) 1 IN: 596
MATERNAL AGE AT EDD: 31.2 a
MSAFP: 34.4 ng/mL
MSHCG: 78933 m[IU]/mL
T18 (By Age): 1:2321 {titer}
UE3 VALUE: 0.58 ng/mL

## 2017-02-16 ENCOUNTER — Ambulatory Visit (INDEPENDENT_AMBULATORY_CARE_PROVIDER_SITE_OTHER): Payer: BLUE CROSS/BLUE SHIELD | Admitting: Obstetrics

## 2017-02-16 ENCOUNTER — Ambulatory Visit (HOSPITAL_COMMUNITY)
Admission: RE | Admit: 2017-02-16 | Discharge: 2017-02-16 | Disposition: A | Discharge status: Home / Self Care | Payer: BLUE CROSS/BLUE SHIELD | Source: Ambulatory Visit | Attending: Obstetrics | Admitting: Obstetrics

## 2017-02-16 ENCOUNTER — Other Ambulatory Visit: Payer: Self-pay | Admitting: Obstetrics

## 2017-02-16 VITALS — BP 158/85 | HR 87 | Wt 241.0 lb

## 2017-02-16 DIAGNOSIS — O99212 Obesity complicating pregnancy, second trimester: Secondary | ICD-10-CM | POA: Insufficient documentation

## 2017-02-16 DIAGNOSIS — Z3A19 19 weeks gestation of pregnancy: Secondary | ICD-10-CM | POA: Diagnosis not present

## 2017-02-16 DIAGNOSIS — Z3480 Encounter for supervision of other normal pregnancy, unspecified trimester: Secondary | ICD-10-CM

## 2017-02-16 DIAGNOSIS — Z3689 Encounter for other specified antenatal screening: Secondary | ICD-10-CM

## 2017-02-16 DIAGNOSIS — Z363 Encounter for antenatal screening for malformations: Secondary | ICD-10-CM | POA: Diagnosis not present

## 2017-02-17 ENCOUNTER — Encounter: Payer: Self-pay | Admitting: Obstetrics

## 2017-02-17 NOTE — Progress Notes (Addendum)
Subjective:  Amanda Melton is a 31 y.o. [phone] at [redacted]w[redacted]d being seen today for ongoing prenatal care.  She is currently monitored for the following issues for this low-risk pregnancy and has Postpartum hemorrhage; Breakthrough bleeding on depo provera; Excessive or frequent menstruation; Vaginitis and vulvovaginitis, unspecified; Abnormal uterine bleeding (AUB); Obesity; and Supervision of normal pregnancy, antepartum on her problem list.  Patient reports pelvic pressure.  Contractions: Not present. Vag. Bleeding: None.  Movement: Present. Denies leaking of fluid.   The following portions of the patient's history were reviewed and updated as appropriate: allergies, current medications, past family history, past medical history, past social history, past surgical history and problem list. Problem list updated.  Objective:   Vitals:   02/16/17 1445  BP: (!) 158/85  Pulse: 87  Weight: 241 lb (109.3 kg)    Fetal Status: Fetal Heart Rate (bpm): 150   Movement: Present     General:  Alert, oriented and cooperative. Patient is in no acute distress.  Skin: Skin is warm and dry. No rash noted.   Cardiovascular: Normal heart rate noted  Respiratory: Normal respiratory effort, no problems with respiration noted  Abdomen: Soft, gravid, appropriate for gestational age. Pain/Pressure: Absent     Pelvic:  Cervical exam deferred        Extremities: Normal range of motion.  Edema: None  Mental Status: Normal mood and affect. Normal behavior. Normal judgment and thought content.   Urinalysis:      Assessment and Plan:  Pregnancy: [id number] at [redacted]w[redacted]d  1. Supervision of other normal pregnancy, antepartum   2. Encounter for ultrasound to assess interval growth of fetus Rx: - [place] MFM OB FOLLOW UP; Future  Pelvic pressure Rx: - Maternity Belt Rx  Preterm labor symptoms and general obstetric precautions including but not limited to vaginal bleeding, contractions, leaking of fluid and fetal movement  were reviewed in detail with the patient. Please refer to After Visit Summary for other counseling recommendations.  Return in 4 weeks (on 03/16/2017).   Brock Bad, MDPatient ID: Amanda Melton, female   DOB: 1986-08-25, 31 y.o.   MRN: [id number]

## 2017-02-19 ENCOUNTER — Encounter: Payer: BLUE CROSS/BLUE SHIELD | Admitting: Obstetrics

## 2017-03-16 ENCOUNTER — Encounter: Payer: Self-pay | Admitting: Obstetrics

## 2017-03-16 ENCOUNTER — Ambulatory Visit (INDEPENDENT_AMBULATORY_CARE_PROVIDER_SITE_OTHER): Payer: BLUE CROSS/BLUE SHIELD | Admitting: Obstetrics

## 2017-03-16 VITALS — BP 146/92 | HR 91 | Wt 250.8 lb

## 2017-03-16 DIAGNOSIS — O169 Unspecified maternal hypertension, unspecified trimester: Secondary | ICD-10-CM

## 2017-03-16 DIAGNOSIS — Z3482 Encounter for supervision of other normal pregnancy, second trimester: Secondary | ICD-10-CM

## 2017-03-16 DIAGNOSIS — Z3480 Encounter for supervision of other normal pregnancy, unspecified trimester: Secondary | ICD-10-CM

## 2017-03-16 NOTE — Progress Notes (Signed)
Subjective:  Amanda Melton is a 31 y.o. [phone] at 6370w2d being seen today for ongoing prenatal care.  She is currently monitored for the following issues for this low-risk pregnancy and has Postpartum hemorrhage; Breakthrough bleeding on depo provera; Excessive or frequent menstruation; Vaginitis and vulvovaginitis, unspecified; Abnormal uterine bleeding (AUB); Obesity; and Supervision of normal pregnancy, antepartum on her problem list.  Patient reports no complaints.  Contractions: Not present. Vag. Bleeding: None.  Movement: Present. Denies leaking of fluid.   The following portions of the patient's history were reviewed and updated as appropriate: allergies, current medications, past family history, past medical history, past social history, past surgical history and problem list. Problem list updated.  Objective:   Vitals:   03/16/17 1532  BP: (!) 146/92  Pulse: 91  Weight: 250 lb 12.8 oz (113.8 kg)    Fetal Status: Fetal Heart Rate (bpm): 150   Movement: Present     General:  Alert, oriented and cooperative. Patient is in no acute distress.  Skin: Skin is warm and dry. No rash noted.   Cardiovascular: Normal heart rate noted  Respiratory: Normal respiratory effort, no problems with respiration noted  Abdomen: Soft, gravid, appropriate for gestational age. Pain/Pressure: Absent     Pelvic:  Cervical exam deferred        Extremities: Normal range of motion.  Edema: None  Mental Status: Normal mood and affect. Normal behavior. Normal judgment and thought content.   Urinalysis:      Assessment and Plan:  Pregnancy: [id number] at 2070w2d  1. Supervision of other normal pregnancy, antepartum   2. Hypertension affecting pregnancy, antepartum Rx: - Creatinine clearance, urine, 24 hour; Future - Protein, urine, 24 hour; Future  Preterm labor symptoms and general obstetric precautions including but not limited to vaginal bleeding, contractions, leaking of fluid and fetal movement were  reviewed in detail with the patient. Please refer to After Visit Summary for other counseling recommendations.  No Follow-up on file.   Brock BadHarper, Santana Gosdin A, MDPatient ID: Amanda Melton, female   DOB: 1986-04-01, 31 y.o.   MRN: [id number]

## 2017-03-16 NOTE — Progress Notes (Signed)
No complaints

## 2017-03-23 ENCOUNTER — Other Ambulatory Visit: Payer: Self-pay | Admitting: Obstetrics

## 2017-03-23 ENCOUNTER — Ambulatory Visit (HOSPITAL_COMMUNITY)
Admission: RE | Admit: 2017-03-23 | Discharge: 2017-03-23 | Disposition: A | Discharge status: Home / Self Care | Payer: BLUE CROSS/BLUE SHIELD | Source: Ambulatory Visit | Attending: Obstetrics | Admitting: Obstetrics

## 2017-03-23 DIAGNOSIS — Z3689 Encounter for other specified antenatal screening: Secondary | ICD-10-CM | POA: Diagnosis present

## 2017-03-23 DIAGNOSIS — O99212 Obesity complicating pregnancy, second trimester: Secondary | ICD-10-CM | POA: Insufficient documentation

## 2017-03-23 DIAGNOSIS — Z3A24 24 weeks gestation of pregnancy: Secondary | ICD-10-CM | POA: Diagnosis not present

## 2017-03-23 DIAGNOSIS — Z362 Encounter for other antenatal screening follow-up: Secondary | ICD-10-CM | POA: Insufficient documentation

## 2017-04-10 ENCOUNTER — Inpatient Hospital Stay (HOSPITAL_COMMUNITY)
Admission: AD | Admit: 2017-04-10 | Discharge: 2017-04-10 | Disposition: A | Discharge status: Home / Self Care | Payer: BLUE CROSS/BLUE SHIELD | Source: Ambulatory Visit | Attending: Obstetrics & Gynecology | Admitting: Obstetrics & Gynecology

## 2017-04-10 ENCOUNTER — Encounter (HOSPITAL_COMMUNITY): Payer: Self-pay | Admitting: *Deleted

## 2017-04-10 DIAGNOSIS — O9989 Other specified diseases and conditions complicating pregnancy, childbirth and the puerperium: Secondary | ICD-10-CM

## 2017-04-10 DIAGNOSIS — Z3A00 Weeks of gestation of pregnancy not specified: Secondary | ICD-10-CM | POA: Insufficient documentation

## 2017-04-10 DIAGNOSIS — O26892 Other specified pregnancy related conditions, second trimester: Secondary | ICD-10-CM | POA: Insufficient documentation

## 2017-04-10 DIAGNOSIS — R109 Unspecified abdominal pain: Secondary | ICD-10-CM | POA: Insufficient documentation

## 2017-04-10 DIAGNOSIS — O26899 Other specified pregnancy related conditions, unspecified trimester: Secondary | ICD-10-CM

## 2017-04-10 DIAGNOSIS — O99891 Other specified diseases and conditions complicating pregnancy: Secondary | ICD-10-CM

## 2017-04-10 DIAGNOSIS — M549 Dorsalgia, unspecified: Secondary | ICD-10-CM | POA: Insufficient documentation

## 2017-04-10 LAB — URINALYSIS, ROUTINE W REFLEX MICROSCOPIC
Bilirubin Urine: NEGATIVE
GLUCOSE, UA: 150 mg/dL — AB
Hgb urine dipstick: NEGATIVE
Ketones, ur: 5 mg/dL — AB
LEUKOCYTES UA: NEGATIVE
Nitrite: NEGATIVE
PH: 5 (ref 5.0–8.0)
Protein, ur: NEGATIVE mg/dL
SPECIFIC GRAVITY, URINE: 1.027 (ref 1.005–1.030)

## 2017-04-10 LAB — GLUCOSE, CAPILLARY: Glucose-Capillary: 113 mg/dL — ABNORMAL HIGH (ref 65–99)

## 2017-04-10 NOTE — MAU Note (Addendum)
+   pelvic pain +back pain Pressure and cramping in nature Rating pain 10/10 Symptoms x2 days Has tried extra strength tylenol; last dose 8am today ("600mg ")  Denies LOF or VB.  +FM

## 2017-04-10 NOTE — Discharge Instructions (Signed)
Back Pain in Pregnancy Back pain during pregnancy is common. Back pain may be caused by several factors that are related to changes during your pregnancy. Follow these instructions at home: Managing pain, stiffness, and swelling  If directed, apply ice for sudden (acute) back pain. ? Put ice in a plastic bag. ? Place a towel between your skin and the bag. ? Leave the ice on for 20 minutes, 2-3 times per day.  If directed, apply heat to the affected area before you exercise: ? Place a towel between your skin and the heat pack or heating pad. ? Leave the heat on for 20-30 minutes. ? Remove the heat if your skin turns bright red. This is especially important if you are unable to feel pain, heat, or cold. You may have a greater risk of getting burned. Activity  Exercise as told by your health care provider. Exercising is the best way to prevent or manage back pain.  Listen to your body when lifting. If lifting hurts, ask for help or bend your knees. This uses your leg muscles instead of your back muscles.  Squat down when picking up something from the floor. Do not bend over.  Only use bed rest as told by your health care provider. Bed rest should only be used for the most severe episodes of back pain. Standing, Sitting, and Lying Down  Do not stand in one place for long periods of time.  Use good posture when sitting. Make sure your head rests over your shoulders and is not hanging forward. Use a pillow on your lower back if necessary.  Try sleeping on your side, preferably the left side, with a pillow or two between your legs. If you are sore after a night's rest, your bed may be too soft. A firm mattress may provide more support for your back during pregnancy. General instructions  Do not wear high heels.  Eat a healthy diet. Try to gain weight within your health care provider's recommendations.  Use a maternity girdle, elastic sling, or back brace as told by your health care  provider.  Take over-the-counter and prescription medicines only as told by your health care provider.  Keep all follow-up visits as told by your health care provider. This is important. This includes any visits with any specialists, such as a physical therapist. Contact a health care provider if:  Your back pain interferes with your daily activities.  You have increasing pain in other parts of your body. Get help right away if:  You develop numbness, tingling, weakness, or problems with the use of your arms or legs.  You develop severe back pain that is not controlled with medicine.  You have a sudden change in bowel or bladder control.  You develop shortness of breath, dizziness, or you faint.  You develop nausea, vomiting, or sweating.  You have back pain that is a rhythmic, cramping pain similar to labor pains. Labor pain is usually 1-2 minutes apart, lasts for about 1 minute, and involves a bearing down feeling or pressure in your pelvis.  You have back pain and your water breaks or you have vaginal bleeding.  You have back pain or numbness that travels down your leg.  Your back pain developed after you fell.  You develop pain on one side of your back.  You see blood in your urine.  You develop skin blisters in the area of your back pain. This information is not intended to replace advice given to you   by your health care provider. Make sure you discuss any questions you have with your health care provider. Document Released: 01/28/2006 Document Revised: 03/27/2016 Document Reviewed: 07/04/2015 Elsevier Interactive Patient Education  2018 [employer]  

## 2017-04-13 ENCOUNTER — Encounter: Payer: Self-pay | Admitting: Obstetrics

## 2017-04-13 ENCOUNTER — Other Ambulatory Visit: Payer: BLUE CROSS/BLUE SHIELD

## 2017-04-13 ENCOUNTER — Ambulatory Visit (INDEPENDENT_AMBULATORY_CARE_PROVIDER_SITE_OTHER): Payer: BLUE CROSS/BLUE SHIELD | Admitting: Obstetrics

## 2017-04-13 VITALS — BP 131/77 | HR 89 | Wt 253.9 lb

## 2017-04-13 DIAGNOSIS — Z23 Encounter for immunization: Secondary | ICD-10-CM

## 2017-04-13 DIAGNOSIS — O4702 False labor before 37 completed weeks of gestation, second trimester: Secondary | ICD-10-CM

## 2017-04-13 DIAGNOSIS — Z3492 Encounter for supervision of normal pregnancy, unspecified, second trimester: Secondary | ICD-10-CM

## 2017-04-13 DIAGNOSIS — Z3490 Encounter for supervision of normal pregnancy, unspecified, unspecified trimester: Secondary | ICD-10-CM

## 2017-04-13 DIAGNOSIS — O4700 False labor before 37 completed weeks of gestation, unspecified trimester: Secondary | ICD-10-CM

## 2017-04-13 NOTE — Progress Notes (Signed)
TDAP given in Right deltoid. Tolerated well.

## 2017-04-13 NOTE — Progress Notes (Signed)
Subjective:  Amanda Melton is a 31 y.o. [phone] at 848w2d being seen today for ongoing prenatal care.  She is currently monitored for the following issues for this low-risk pregnancy and has Postpartum hemorrhage; Breakthrough bleeding on depo provera; Excessive or frequent menstruation; Vaginitis and vulvovaginitis, unspecified; Abnormal uterine bleeding (AUB); Obesity; and Supervision of normal pregnancy, antepartum on her problem list.  Patient reports pelvic pressure.  Contractions: Not present. Vag. Bleeding: None.  Movement: Present. Denies leaking of fluid.   The following portions of the patient's history were reviewed and updated as appropriate: allergies, current medications, past family history, past medical history, past social history, past surgical history and problem list. Problem list updated.  Objective:   Vitals:   04/13/17 0950  BP: 131/77  Pulse: 89  Weight: 253 lb 14.4 oz (115.2 kg)    Fetal Status: Fetal Heart Rate (bpm): 150   Movement: Present     General:  Alert, oriented and cooperative. Patient is in no acute distress.  Skin: Skin is warm and dry. No rash noted.   Cardiovascular: Normal heart rate noted  Respiratory: Normal respiratory effort, no problems with respiration noted  Abdomen: Soft, gravid, appropriate for gestational age. Pain/Pressure: Present     Pelvic:  Cervical exam deferred        Extremities: Normal range of motion.  Edema: None  Mental Status: Normal mood and affect. Normal behavior. Normal judgment and thought content.   Urinalysis:      Assessment and Plan:  Pregnancy: [id number] at 2448w2d  1. Encounter for supervision of normal pregnancy, antepartum, unspecified gravidity 2. Threatened PTL - Pelvic rest  Preterm labor symptoms and general obstetric precautions including but not limited to vaginal bleeding, contractions, leaking of fluid and fetal movement were reviewed in detail with the patient. Please refer to After Visit Summary for  other counseling recommendations.  Return in 2 weeks (on 04/27/2017) for ROB.   Brock BadHarper, Atley Neubert A, MDPatient ID: Amanda Melton, female   DOB: January 02, 1986, 31 y.o.   MRN: [id number]

## 2017-04-14 LAB — CBC
HEMATOCRIT: 34.7 % (ref 34.0–46.6)
HEMOGLOBIN: 11.7 g/dL (ref 11.1–15.9)
MCH: 30.8 pg (ref 26.6–33.0)
MCHC: 33.7 g/dL (ref 31.5–35.7)
MCV: 91 fL (ref 79–97)
Platelets: 219 10*3/uL (ref 150–379)
RBC: 3.8 x10E6/uL (ref 3.77–5.28)
RDW: 13.6 % (ref 12.3–15.4)
WBC: 8.6 10*3/uL (ref 3.4–10.8)

## 2017-04-14 LAB — GLUCOSE TOLERANCE, 2 HOURS W/ 1HR
GLUCOSE, FASTING: 69 mg/dL (ref 65–91)
Glucose, 1 hour: 163 mg/dL (ref 65–179)
Glucose, 2 hour: 121 mg/dL (ref 65–152)

## 2017-04-14 LAB — RPR: RPR Ser Ql: NONREACTIVE

## 2017-04-14 LAB — HIV ANTIBODY (ROUTINE TESTING W REFLEX): HIV Screen 4th Generation wRfx: NONREACTIVE

## 2017-04-28 ENCOUNTER — Ambulatory Visit (INDEPENDENT_AMBULATORY_CARE_PROVIDER_SITE_OTHER): Payer: BLUE CROSS/BLUE SHIELD | Admitting: Obstetrics

## 2017-04-28 ENCOUNTER — Encounter: Payer: Self-pay | Admitting: Obstetrics

## 2017-04-28 VITALS — BP 134/89 | HR 88 | Wt 257.0 lb

## 2017-04-28 DIAGNOSIS — Z3483 Encounter for supervision of other normal pregnancy, third trimester: Secondary | ICD-10-CM

## 2017-04-28 DIAGNOSIS — Z3490 Encounter for supervision of normal pregnancy, unspecified, unspecified trimester: Secondary | ICD-10-CM

## 2017-04-28 NOTE — Progress Notes (Signed)
Subjective:  Prescott GumWendy C Whitener is a 31 y.o. [phone] at 6526w3d being seen today for ongoing prenatal care.  She is currently monitored for the following issues for this low-risk pregnancy and has Postpartum hemorrhage; Breakthrough bleeding on depo provera; Excessive or frequent menstruation; Vaginitis and vulvovaginitis, unspecified; Abnormal uterine bleeding (AUB); Obesity; and Supervision of normal pregnancy, antepartum on her problem list.  Patient reports backache and pelvic pressure.  Contractions: Not present. Vag. Bleeding: None.  Movement: Present. Denies leaking of fluid.   The following portions of the patient's history were reviewed and updated as appropriate: allergies, current medications, past family history, past medical history, past social history, past surgical history and problem list. Problem list updated.  Objective:   Vitals:   04/28/17 1012  BP: 134/89  Pulse: 88  Weight: 257 lb (116.6 kg)    Fetal Status: Fetal Heart Rate (bpm): 150   Movement: Present     General:  Alert, oriented and cooperative. Patient is in no acute distress.  Skin: Skin is warm and dry. No rash noted.   Cardiovascular: Normal heart rate noted  Respiratory: Normal respiratory effort, no problems with respiration noted  Abdomen: Soft, gravid, appropriate for gestational age. Pain/Pressure: Present     Pelvic:  Cervical exam deferred        Extremities: Normal range of motion.     Mental Status: Normal mood and affect. Normal behavior. Normal judgment and thought content.   Urinalysis:      Assessment and Plan:  Pregnancy: [id number] at 3226w3d  1. Encounter for supervision of normal pregnancy, antepartum, unspecified gravidity - Doing well.  2. Backache and Pelvic Pressure - Maternity Belt Rx  Preterm labor symptoms and general obstetric precautions including but not limited to vaginal bleeding, contractions, leaking of fluid and fetal movement were reviewed in detail with the patient. Please  refer to After Visit Summary for other counseling recommendations.  Return in about 2 weeks (around 05/12/2017) for ROB.   Brock BadHarper, Colie Josten A, MD                                                                                                                                                                                                                  Patient ID: Prescott GumWendy C Epp, female   DOB: Sep 24, 1986, 31 y.o.   MRN: [id number]

## 2017-04-28 NOTE — Progress Notes (Signed)
Pt states she is having pelvic pressure.

## 2017-05-12 ENCOUNTER — Ambulatory Visit (INDEPENDENT_AMBULATORY_CARE_PROVIDER_SITE_OTHER): Payer: BLUE CROSS/BLUE SHIELD | Admitting: Obstetrics

## 2017-05-12 ENCOUNTER — Encounter: Payer: Self-pay | Admitting: Obstetrics

## 2017-05-12 VITALS — BP 129/77 | HR 90 | Wt 256.7 lb

## 2017-05-12 DIAGNOSIS — Z3490 Encounter for supervision of normal pregnancy, unspecified, unspecified trimester: Secondary | ICD-10-CM

## 2017-05-12 DIAGNOSIS — Z3483 Encounter for supervision of other normal pregnancy, third trimester: Secondary | ICD-10-CM

## 2017-05-12 NOTE — Progress Notes (Signed)
Subjective:  Amanda Melton Cottone is a 31 y.o. [phone] at 3261w3d being seen today for ongoing prenatal care.  She is currently monitored for the following issues for this low-risk pregnancy and has Postpartum hemorrhage; Breakthrough bleeding on depo provera; Excessive or frequent menstruation; Vaginitis and vulvovaginitis, unspecified; Abnormal uterine bleeding (AUB); Obesity; and Supervision of normal pregnancy, antepartum on her problem list.  Patient reports no complaints.  Contractions: Not present. Vag. Bleeding: None.  Movement: Present. Denies leaking of fluid.   The following portions of the patient's history were reviewed and updated as appropriate: allergies, current medications, past family history, past medical history, past social history, past surgical history and problem list. Problem list updated.  Objective:   Vitals:   05/12/17 1055  BP: 129/77  Pulse: 90  Weight: 256 lb 11.2 oz (116.4 kg)    Fetal Status: Fetal Heart Rate (bpm): 150   Movement: Present     General:  Alert, oriented and cooperative. Patient is in no acute distress.  Skin: Skin is warm and dry. No rash noted.   Cardiovascular: Normal heart rate noted  Respiratory: Normal respiratory effort, no problems with respiration noted  Abdomen: Soft, gravid, appropriate for gestational age. Pain/Pressure: Present     Pelvic:  Cervical exam deferred        Extremities: Normal range of motion.  Edema: Trace  Mental Status: Normal mood and affect. Normal behavior. Normal judgment and thought content.   Urinalysis:      Assessment and Plan:  Pregnancy: [id number] at 5161w3d  1. Encounter for supervision of normal pregnancy, antepartum, unspecified gravidity   Preterm labor symptoms and general obstetric precautions including but not limited to vaginal bleeding, contractions, leaking of fluid and fetal movement were reviewed in detail with the patient. Please refer to After Visit Summary for other counseling  recommendations.  Return in about 2 weeks (around 05/26/2017) for ROB.   Brock BadHarper, Emmitt Matthews A, MDPatient ID: Amanda Melton Delaughter, female   DOB: February 15, 1986, 31 y.o.   MRN: [id number]

## 2017-05-12 NOTE — Progress Notes (Signed)
Patient reports good fetal movement and pressure, denies contractions. 

## 2017-05-24 ENCOUNTER — Encounter (HOSPITAL_COMMUNITY): Payer: Self-pay | Admitting: *Deleted

## 2017-05-24 ENCOUNTER — Inpatient Hospital Stay (HOSPITAL_COMMUNITY)
Admission: AD | Admit: 2017-05-24 | Discharge: 2017-05-24 | Disposition: A | Discharge status: Home / Self Care | Payer: BLUE CROSS/BLUE SHIELD | Source: Ambulatory Visit | Attending: Obstetrics & Gynecology | Admitting: Obstetrics & Gynecology

## 2017-05-24 DIAGNOSIS — R109 Unspecified abdominal pain: Secondary | ICD-10-CM | POA: Diagnosis present

## 2017-05-24 DIAGNOSIS — Z3A33 33 weeks gestation of pregnancy: Secondary | ICD-10-CM | POA: Insufficient documentation

## 2017-05-24 DIAGNOSIS — O26893 Other specified pregnancy related conditions, third trimester: Secondary | ICD-10-CM | POA: Diagnosis not present

## 2017-05-24 LAB — URINALYSIS, ROUTINE W REFLEX MICROSCOPIC
Bilirubin Urine: NEGATIVE
Glucose, UA: NEGATIVE mg/dL
HGB URINE DIPSTICK: NEGATIVE
Ketones, ur: 5 mg/dL — AB
LEUKOCYTES UA: NEGATIVE
NITRITE: NEGATIVE
PROTEIN: NEGATIVE mg/dL
Specific Gravity, Urine: 1.011 (ref 1.005–1.030)
pH: 7 (ref 5.0–8.0)

## 2017-05-24 NOTE — Discharge Instructions (Signed)

## 2017-05-24 NOTE — MAU Note (Signed)
Pt reports she has been having pelvic pressure since yesterday. States the pain is lower abd and lower back. States it is worsening.

## 2017-05-24 NOTE — MAU Provider Note (Signed)
History   [id number] @ 33.1 wks in with c/o abd and back pain since yesterday pain is intermittent depending on activity.  CSN: [id number]  Arrival date & time 05/24/17  1116   None     Chief Complaint  Patient presents with  . Pelvic Pain    HPI  Past Medical History:  Diagnosis Date  . Asthma   . Chlamydia 2005  . Environmental allergies   . Hypertension    gestational hypertension    Past Surgical History:  Procedure Laterality Date  . NO PAST SURGERIES      Family History  Problem Relation Age of Onset  . Diabetes Father   . Hypertension Father   . Kidney failure Father   . Anesthesia problems Neg Hx     Social History  Substance Use Topics  . Smoking status: Never Smoker  . Smokeless tobacco: Never Used  . Alcohol use No     Comment: occasionally    OB History    Gravida Para Term Preterm AB Living   4 2 2  0 1 2   SAB TAB Ectopic Multiple Live Births   1 0 0 0 2      Review of Systems  Constitutional: Negative.   HENT: Negative.   Eyes: Negative.   Respiratory: Negative.   Cardiovascular: Negative.   Gastrointestinal: Positive for abdominal pain.  Endocrine: Negative.   Genitourinary: Negative.   Musculoskeletal: Positive for back pain.  Skin: Negative.   Allergic/Immunologic: Negative.   Neurological: Negative.   Hematological: Negative.   Psychiatric/Behavioral: Negative.     Allergies  Patient has no known allergies.  Home Medications    BP 132/82   Pulse 93   Temp 98.8 F (37.1 C) (Oral)   Resp 18   Ht 5\' 5"  (1.651 m)   Wt 260 lb (117.9 kg)   LMP 10/04/2016   SpO2 98%   BMI 43.27 kg/m   Physical Exam  Constitutional: She is oriented to person, place, and time. She appears well-developed and well-nourished.  HENT:  Head: Normocephalic.  Eyes: Pupils are equal, round, and reactive to light.  Neck: Normal range of motion.  Cardiovascular: Normal rate, regular rhythm, normal heart sounds and intact distal pulses.    Pulmonary/Chest: Effort normal and breath sounds normal.  Abdominal: Soft.  Genitourinary: Vagina normal and uterus normal.  Musculoskeletal: Normal range of motion.  Neurological: She is alert and oriented to person, place, and time. She has normal reflexes.  Skin: Skin is warm and dry.  Psychiatric: She has a normal mood and affect. Her behavior is normal. Judgment and thought content normal.    MAU Course  Procedures (including critical care time)  Labs Reviewed  URINALYSIS, ROUTINE W REFLEX MICROSCOPIC - Abnormal; Notable for the following:       Result Value   Ketones, ur 5 (*)    All other components within normal limits   No results found.   1. Abdominal pain in pregnancy, third trimester       MDM  VSS, SVE firm/cl/post/high. FHR reassuring, only occasional uc's. U/a shows mild dehydration. Discussed importance of staying hydrated in hot weather. Pt verbalized understanding. Will d/c home in stable condition

## 2017-05-26 ENCOUNTER — Encounter: Payer: Self-pay | Admitting: Obstetrics

## 2017-05-26 ENCOUNTER — Ambulatory Visit (INDEPENDENT_AMBULATORY_CARE_PROVIDER_SITE_OTHER): Payer: BLUE CROSS/BLUE SHIELD | Admitting: Obstetrics

## 2017-05-26 VITALS — BP 144/86 | HR 106 | Wt 260.4 lb

## 2017-05-26 DIAGNOSIS — O133 Gestational [pregnancy-induced] hypertension without significant proteinuria, third trimester: Secondary | ICD-10-CM

## 2017-05-26 DIAGNOSIS — Z3493 Encounter for supervision of normal pregnancy, unspecified, third trimester: Secondary | ICD-10-CM

## 2017-05-26 DIAGNOSIS — Z3490 Encounter for supervision of normal pregnancy, unspecified, unspecified trimester: Secondary | ICD-10-CM

## 2017-05-26 NOTE — Progress Notes (Signed)
Subjective:  [name] is a 31 y.o. [phone] at 6056w3d being seen today for ongoing prenatal care.  She is currently monitored for the following issues for this low-risk pregnancy and has Postpartum hemorrhage; Breakthrough bleeding on depo provera; Excessive or frequent menstruation; Vaginitis and vulvovaginitis, unspecified; Abnormal uterine bleeding (AUB); Obesity; and Supervision of normal pregnancy, antepartum on her problem list.  Patient reports carpal tunnel symptoms and occasional contractions.  Contractions: Irregular. Vag. Bleeding: None.  Movement: Present. Denies leaking of fluid.   The following portions of the patient's history were reviewed and updated as appropriate: allergies, current medications, past family history, past medical history, past social history, past surgical history and problem list. Problem list updated.  Objective:   Vitals:   05/26/17 1038  BP: (!) 144/86  Pulse: (!) 106  Weight: 260 lb 6.4 oz (118.1 kg)    Fetal Status: Fetal Heart Rate (bpm): 150   Movement: Present     General:  Alert, oriented and cooperative. Patient is in no acute distress.  Skin: Skin is warm and dry. No rash noted.   Cardiovascular: Normal heart rate noted  Respiratory: Normal respiratory effort, no problems with respiration noted  Abdomen: Soft, gravid, appropriate for gestational age. Pain/Pressure: Present     Pelvic:  Cervical exam deferred        Extremities: Normal range of motion.  Edema: None  Mental Status: Normal mood and affect. Normal behavior. Normal judgment and thought content.   Urinalysis:      Assessment and Plan:  Pregnancy: [id number] at 3756w3d  1. Encounter for supervision of normal pregnancy, antepartum, unspecified gravidity   2. PIH (pregnancy induced hypertension), third trimester Rx: - CBC - ALT - AST - Protein / creatinine ratio, urine - Creatinine, serum - Lactate dehydrogenase  Preterm labor symptoms and general obstetric precautions  including but not limited to vaginal bleeding, contractions, leaking of fluid and fetal movement were reviewed in detail with the patient. Please refer to After Visit Summary for other counseling recommendations.  Return in about 1 week (around 06/02/2017) for ROB.   Brock BadHarper, Charles A, MD

## 2017-05-26 NOTE — Progress Notes (Signed)
Patient is having flare in her tendinitis.Patient is having irregular contractions- 6-7/day. Patient was evaluated at MAU.

## 2017-05-27 LAB — CBC
HEMATOCRIT: 30.9 % — AB (ref 34.0–46.6)
HEMOGLOBIN: 10.5 g/dL — AB (ref 11.1–15.9)
MCH: 29.3 pg (ref 26.6–33.0)
MCHC: 34 g/dL (ref 31.5–35.7)
MCV: 86 fL (ref 79–97)
Platelets: 216 10*3/uL (ref 150–379)
RBC: 3.58 x10E6/uL — AB (ref 3.77–5.28)
RDW: 13.1 % (ref 12.3–15.4)
WBC: 8.2 10*3/uL (ref 3.4–10.8)

## 2017-05-27 LAB — PROTEIN / CREATININE RATIO, URINE
Creatinine, Urine: 157.8 mg/dL
PROTEIN UR: 20.3 mg/dL
PROTEIN/CREAT RATIO: 129 mg/g{creat} (ref 0–200)

## 2017-05-27 LAB — CREATININE, SERUM
CREATININE: 0.53 mg/dL — AB (ref 0.57–1.00)
GFR, EST AFRICAN AMERICAN: 146 mL/min/{1.73_m2} (ref >59)
GFR, EST NON AFRICAN AMERICAN: 127 mL/min/{1.73_m2} (ref >59)

## 2017-05-27 LAB — LACTATE DEHYDROGENASE: LDH: 199 IU/L (ref 119–226)

## 2017-05-27 LAB — AST: AST: 17 IU/L (ref 0–40)

## 2017-05-27 LAB — ALT: ALT: 11 IU/L (ref 0–32)

## 2017-06-03 ENCOUNTER — Encounter (HOSPITAL_COMMUNITY): Payer: Self-pay

## 2017-06-03 ENCOUNTER — Inpatient Hospital Stay (HOSPITAL_COMMUNITY)
Admission: AD | Admit: 2017-06-03 | Discharge: 2017-06-03 | Disposition: A | Discharge status: Home / Self Care | Payer: BLUE CROSS/BLUE SHIELD | Source: Ambulatory Visit | Attending: Family Medicine | Admitting: Family Medicine

## 2017-06-03 ENCOUNTER — Ambulatory Visit (INDEPENDENT_AMBULATORY_CARE_PROVIDER_SITE_OTHER): Payer: BLUE CROSS/BLUE SHIELD | Admitting: Obstetrics

## 2017-06-03 VITALS — BP 146/93 | HR 96 | Wt 260.4 lb

## 2017-06-03 DIAGNOSIS — Z3490 Encounter for supervision of normal pregnancy, unspecified, unspecified trimester: Secondary | ICD-10-CM

## 2017-06-03 DIAGNOSIS — O169 Unspecified maternal hypertension, unspecified trimester: Secondary | ICD-10-CM

## 2017-06-03 DIAGNOSIS — Z3A34 34 weeks gestation of pregnancy: Secondary | ICD-10-CM | POA: Diagnosis not present

## 2017-06-03 DIAGNOSIS — O163 Unspecified maternal hypertension, third trimester: Secondary | ICD-10-CM

## 2017-06-03 DIAGNOSIS — O4700 False labor before 37 completed weeks of gestation, unspecified trimester: Secondary | ICD-10-CM

## 2017-06-03 DIAGNOSIS — Z3483 Encounter for supervision of other normal pregnancy, third trimester: Secondary | ICD-10-CM

## 2017-06-03 DIAGNOSIS — O4703 False labor before 37 completed weeks of gestation, third trimester: Secondary | ICD-10-CM

## 2017-06-03 LAB — URINALYSIS, ROUTINE W REFLEX MICROSCOPIC
Bilirubin Urine: NEGATIVE
GLUCOSE, UA: NEGATIVE mg/dL
Hgb urine dipstick: NEGATIVE
Ketones, ur: 20 mg/dL — AB
LEUKOCYTES UA: NEGATIVE
Nitrite: NEGATIVE
PH: 6 (ref 5.0–8.0)
PROTEIN: NEGATIVE mg/dL
Specific Gravity, Urine: 1.018 (ref 1.005–1.030)

## 2017-06-03 LAB — COMPREHENSIVE METABOLIC PANEL
ALK PHOS: 85 U/L (ref 38–126)
ALT: 16 U/L (ref 14–54)
AST: 17 U/L (ref 15–41)
Albumin: 2.8 g/dL — ABNORMAL LOW (ref 3.5–5.0)
Anion gap: 7 (ref 5–15)
BILIRUBIN TOTAL: 0.3 mg/dL (ref 0.3–1.2)
CALCIUM: 9 mg/dL (ref 8.9–10.3)
CO2: 22 mmol/L (ref 22–32)
CREATININE: 0.49 mg/dL (ref 0.44–1.00)
Chloride: 105 mmol/L (ref 101–111)
GFR calc Af Amer: >60 mL/min (ref >60)
GFR calc non Af Amer: >60 mL/min (ref >60)
Glucose, Bld: 74 mg/dL (ref 65–99)
POTASSIUM: 3.7 mmol/L (ref 3.5–5.1)
Sodium: 134 mmol/L — ABNORMAL LOW (ref 135–145)
TOTAL PROTEIN: 6.2 g/dL — AB (ref 6.5–8.1)

## 2017-06-03 LAB — CBC WITH DIFFERENTIAL/PLATELET
BASOS ABS: 0 10*3/uL (ref 0.0–0.1)
Basophils Relative: 0 %
Eosinophils Absolute: 0.3 10*3/uL (ref 0.0–0.7)
Eosinophils Relative: 3 %
HEMATOCRIT: 33.3 % — AB (ref 36.0–46.0)
HEMOGLOBIN: 11.3 g/dL — AB (ref 12.0–15.0)
LYMPHS PCT: 29 %
Lymphs Abs: 2.5 10*3/uL (ref 0.7–4.0)
MCH: 30 pg (ref 26.0–34.0)
MCHC: 33.9 g/dL (ref 30.0–36.0)
MCV: 88.3 fL (ref 78.0–100.0)
MONO ABS: 0.7 10*3/uL (ref 0.1–1.0)
Monocytes Relative: 8 %
NEUTROS ABS: 5.4 10*3/uL (ref 1.7–7.7)
Neutrophils Relative %: 60 %
Platelets: 204 10*3/uL (ref 150–400)
RBC: 3.77 MIL/uL — ABNORMAL LOW (ref 3.87–5.11)
RDW: 13 % (ref 11.5–15.5)
WBC: 8.9 10*3/uL (ref 4.0–10.5)

## 2017-06-03 LAB — PROTEIN / CREATININE RATIO, URINE
CREATININE, URINE: 116 mg/dL
Protein Creatinine Ratio: 0.14 mg/mg{Cre} (ref 0.00–0.15)
Total Protein, Urine: 16 mg/dL

## 2017-06-03 MED ORDER — NIFEDIPINE 10 MG PO CAPS
10.0000 mg | ORAL_CAPSULE | ORAL | Status: AC | PRN
  Administered 2017-06-03 (×3): 10 mg via ORAL
  Filled 2017-06-03 (×3): qty 1

## 2017-06-03 MED ORDER — LACTATED RINGERS IV BOLUS (SEPSIS)
1000.0000 mL | Freq: Once | INTRAVENOUS | Status: AC
  Administered 2017-06-03: 1000 mL via INTRAVENOUS

## 2017-06-03 MED ORDER — BETAMETHASONE SOD PHOS & ACET 6 (3-3) MG/ML IJ SUSP
12.0000 mg | INTRAMUSCULAR | Status: DC
  Administered 2017-06-03: 12 mg via INTRAMUSCULAR
  Filled 2017-06-03: qty 2

## 2017-06-03 NOTE — Discharge Instructions (Signed)
Preterm Labor and Birth Information Pregnancy normally lasts 39-41 weeks. Preterm labor is when labor starts early. It starts before you have been pregnant for 37 whole weeks. What are the risk factors for preterm labor? Preterm labor is more likely to occur in women who:  Have an infection while pregnant.  Have a cervix that is short.  Have gone into preterm labor before.  Have had surgery on their cervix.  Are younger than age 31.  Are older than age 31.  Are African American.  Are pregnant with two or more babies.  Take street drugs while pregnant.  Smoke while pregnant.  Do not gain enough weight while pregnant.  Got pregnant right after another pregnancy.  What are the symptoms of preterm labor? Symptoms of preterm labor include:  Cramps. The cramps may feel like the cramps some women get during their period. The cramps may happen with watery poop (diarrhea).  Pain in the belly (abdomen).  Pain in the lower back.  Regular contractions or tightening. It may feel like your belly is getting tighter.  Pressure in the lower belly that seems to get stronger.  More fluid (discharge) leaking from the vagina. The fluid may be watery or bloody.  Water breaking.  Why is it important to notice signs of preterm labor? Babies who are born early may not be fully developed. They have a higher chance for:  Long-term heart problems.  Long-term lung problems.  Trouble controlling body systems, like breathing.  Bleeding in the brain.  A condition called cerebral palsy.  Learning difficulties.  Death.  These risks are highest for babies who are born before 34 weeks of pregnancy. How is preterm labor treated? Treatment depends on:  How long you were pregnant.  Your condition.  The health of your baby.  Treatment may involve:  Having a stitch (suture) placed in your cervix. When you give birth, your cervix opens so the baby can come out. The stitch keeps the  cervix from opening too soon.  Staying at the hospital.  Taking or getting medicines, such as: ? Hormone medicines. ? Medicines to stop contractions. ? Medicines to help the babys lungs develop. ? Medicines to prevent your baby from having cerebral palsy.  What should I do if I am in preterm labor? If you think you are going into labor too soon, call your doctor right away. How can I prevent preterm labor?  Do not use any tobacco products. ? Examples of these are cigarettes, chewing tobacco, and e-cigarettes. ? If you need help quitting, ask your doctor.  Do not use street drugs.  Do not use any medicines unless you ask your doctor if they are safe for you.  Talk with your doctor before taking any herbal supplements.  Make sure you gain enough weight.  Watch for infection. If you think you might have an infection, get it checked right away.  If you have gone into preterm labor before, tell your doctor. This information is not intended to replace advice given to you by your health care provider. Make sure you discuss any questions you have with your health care provider. Document Released: 01/16/2009 Document Revised: 04/01/2016 Document Reviewed: 03/12/2016 Elsevier Interactive Patient Education  2018 Elsevier Inc.   Pelvic Rest Pelvic rest may be recommended if:  Your placenta is partially or completely covering the opening of your cervix (placenta previa).  There is bleeding between the wall of the uterus and the amniotic sac in the first trimester of  pregnancy (subchorionic hemorrhage).  You went into labor too early (preterm labor).  Based on your overall health and the health of your baby, your health care provider will decide if pelvic rest is right for you. How do I rest my pelvis? For as long as told by your health care provider:  Do not have sex, sexual stimulation, or an orgasm.  Do not use tampons. Do not douche. Do not put anything in your vagina.  Do  not lift anything that is heavier than 10 lb (4.5 kg).  Avoid activities that take a lot of effort (are strenuous).  Avoid any activity in which your pelvic muscles could become strained.  When should I seek medical care? Seek medical care if you have:  Cramping pain in your lower abdomen.  Vaginal discharge.  A low, dull backache.  Regular contractions.  Uterine tightening.  When should I seek immediate medical care? Seek immediate medical care if:  You have vaginal bleeding and you are pregnant.  This information is not intended to replace advice given to you by your health care provider. Make sure you discuss any questions you have with your health care provider. Document Released: 02/14/2011 Document Revised: 03/27/2016 Document Reviewed: 04/23/2015 Elsevier Interactive Patient Education  2018 [employer].  [employer] of the uterus can occur throughout pregnancy, but they are not always a sign that you are in labor. You may have practice contractions called Braxton Hicks contractions. These false labor contractions are sometimes confused with true labor. What are Deberah Pelton contractions? Braxton Hicks contractions are tightening movements that occur in the muscles of the uterus before labor. Unlike true labor contractions, these contractions do not result in opening (dilation) and thinning of the cervix. Toward the end of pregnancy (32-34 weeks), Braxton Hicks contractions can happen more often and may become stronger. These contractions are sometimes difficult to tell apart from true labor because they can be very uncomfortable. You should not feel embarrassed if you go to the hospital with false labor. Sometimes, the only way to tell if you are in true labor is for your health care provider to look for changes in the cervix. The health care provider will do a physical exam and may monitor your contractions. If you are not in true labor, the exam  should show that your cervix is not dilating and your water has not broken. If there are no prenatal problems or other health problems associated with your pregnancy, it is completely safe for you to be sent home with false labor. You may continue to have Braxton Hicks contractions until you go into true labor. How can I tell the difference between true labor and false labor?  Differences ? False labor ? Contractions last 30-70 seconds.: Contractions are usually shorter and not as strong as true labor contractions. ? Contractions become very regular.: Contractions are usually irregular. ? Discomfort is usually felt in the top of the uterus, and it spreads to the lower abdomen and low back.: Contractions are often felt in the front of the lower abdomen and in the groin. ? Contractions do not go away with walking.: Contractions may go away when you walk around or change positions while lying down. ? Contractions usually become more intense and increase in frequency.: Contractions get weaker and are shorter-lasting as time goes on. ? The cervix dilates and gets thinner.: The cervix usually does not dilate or become thin. Follow these instructions at home:  Take over-the-counter and prescription medicines  only as told by your health care provider.  Keep up with your usual exercises and follow other instructions from your health care provider.  Eat and drink lightly if you think you are going into labor.  If Braxton Hicks contractions are making you uncomfortable: ? Change your position from lying down or resting to walking, or change from walking to resting. ? Sit and rest in a tub of warm water. ? Drink enough fluid to keep your urine clear or pale yellow. Dehydration may cause these contractions. ? Do slow and deep breathing several times an hour.  Keep all follow-up prenatal visits as told by your health care provider. This is important. Contact a health care provider if:  You have a  fever.  You have continuous pain in your abdomen. Get help right away if:  Your contractions become stronger, more regular, and closer together.  You have fluid leaking or gushing from your vagina.  You pass blood-tinged mucus (bloody show).  You have bleeding from your vagina.  You have low back pain that you never had before.  You feel your babys head pushing down and causing pelvic pressure.  Your baby is not moving inside you as much as it used to. Summary  Contractions that occur before labor are called Braxton Hicks contractions, false labor, or practice contractions.  Braxton Hicks contractions are usually shorter, weaker, farther apart, and less regular than true labor contractions. True labor contractions usually become progressively stronger and regular and they become more frequent.  Manage discomfort from [facility] contractions by changing position, resting in a warm bath, drinking plenty of water, or practicing deep breathing. This information is not intended to replace advice given to you by your health care provider. Make sure you discuss any questions you have with your health care provider. Document Released: 10/20/2005 Document Revised: 09/08/2016 Document Reviewed: 09/08/2016 Elsevier Interactive Patient Education  2017 [employer]

## 2017-06-03 NOTE — MAU Note (Signed)
Sent over by Dr. Clearance CootsHarper bc pt reports she was contracting every 10-15 min. States they are painful. No bleeding, no LOF. States she has had some elevated bp during this pregnancy and is not taking any bp meds at home

## 2017-06-03 NOTE — MAU Note (Signed)
I have communicated with Vonzella NippleJulie Wenzel PA and reviewed vital signs:  Vitals:   06/03/17 1556 06/03/17 1731  BP: 140/74 130/69  Pulse: 89   Resp: 16   Temp: 99.7 F (37.6 C)     Vaginal exam:  Dilation: 3 Effacement (%): 50 Cervical Position: Posterior Station: -3 Exam by:: j wenzel pa,   Also reviewed contraction pattern and that non-stress test is reactive.  It has been documented that patient is contracting irregularly with no cervical change over 2 hours not indicating active labor.  Patient denies any other complaints.  Based on this report provider has given order for discharge.  A discharge order and diagnosis entered by a provider.   Labor discharge instructions reviewed with patient.

## 2017-06-03 NOTE — Progress Notes (Signed)
Subjective:  [name] is a 31 y.o. [phone] at 825w5d being seen today for ongoing prenatal care.  She is currently monitored for the following issues for this high-risk pregnancy and has Postpartum hemorrhage; Breakthrough bleeding on depo provera; Excessive or frequent menstruation; Vaginitis and vulvovaginitis, unspecified; Abnormal uterine bleeding (AUB); Obesity; and Supervision of normal pregnancy, antepartum on her problem list.  Patient reports backache and contractions since this am.  Contractions: Irregular. Vag. Bleeding: None.  Movement: (!) Decreased. Denies leaking of fluid.   The following portions of the patient's history were reviewed and updated as appropriate: allergies, current medications, past family history, past medical history, past social history, past surgical history and problem list. Problem list updated.  Objective:   Vitals:   06/03/17 1356  BP: (!) 146/93  Pulse: 96  Weight: 260 lb 6.4 oz (118.1 kg)    Fetal Status: Fetal Heart Rate (bpm): NST-R   Movement: (!) Decreased     General:  Alert, oriented and cooperative. Patient is in no acute distress.  Skin: Skin is warm and dry. No rash noted.   Cardiovascular: Normal heart rate noted  Respiratory: Normal respiratory effort, no problems with respiration noted  Abdomen: Soft, gravid, appropriate for gestational age. Pain/Pressure: Present     Pelvic:  Cervical exam deferred        Extremities: Normal range of motion.  Edema: None  Mental Status: Normal mood and affect. Normal behavior. Normal judgment and thought content.   Urinalysis:       NST:  Reactive.  Baseline FHR 150's.  15x15 accels.  1 variable decel, mild.  UC's q 5-8 minutes  Assessment and Plan:  Pregnancy: [id number] at 5225w5d  1. Encounter for supervision of normal pregnancy, antepartum, unspecified gravidity   2. Threatened premature labor, antepartum - NST Reactive, with palpable UC's - [facility]  3. Hypertension affecting pregnancy, antepartum - stable  Preterm labor symptoms and general obstetric precautions including but not limited to vaginal bleeding, contractions, leaking of fluid and fetal movement were reviewed in detail with the patient. Please refer to After Visit Summary for other counseling recommendations.  Return in about 1 week (around 06/10/2017) for ROB.   Brock BadHarper, Charles A, MDPatient is having increased discharge she thinks she lost her mucus plug this morning. She is having pressure and contractions more frequent. She states the baby is moving less. She has had a lot of stress this week with family charges.

## 2017-06-03 NOTE — MAU Provider Note (Signed)
History     CSN: [id number]  Arrival date and time: 06/03/17 1516   First Provider Initiated Contact with Patient 06/03/17 1607      Chief Complaint  Patient presents with  . Contractions   HPI Amanda Melton is a 31 y.o. [phone] at 9296w4d who presents to MAU today with complaint of contractions since this morning. She states initially they were q 30 minutes and then in the office they were q 10-15 minutes and now more frequent and painful. She denies bleeding, LOF, headache, blurred vision, RUQ pain or edema. She has had elevated blood pressure since her last visit in the office last week. She has had some mucous discharge.    OB History    Gravida Para Term Preterm AB Living   4 2 2  0 1 2   SAB TAB Ectopic Multiple Live Births   1 0 0 0 2      Past Medical History:  Diagnosis Date  . Asthma   . Chlamydia 2005  . Environmental allergies   . Hypertension    gestational hypertension    Past Surgical History:  Procedure Laterality Date  . NO PAST SURGERIES      Family History  Problem Relation Age of Onset  . Diabetes Father   . Hypertension Father   . Kidney failure Father   . Anesthesia problems Neg Hx     Social History  Substance Use Topics  . Smoking status: Never Smoker  . Smokeless tobacco: Never Used  . Alcohol use No     Comment: occasionally    Allergies: No Known Allergies  Prescriptions Prior to Admission  Medication Sig Dispense Refill Last Dose  . acetaminophen (TYLENOL) 500 MG tablet Take 1,000 mg by mouth every 8 (eight) hours as needed for mild pain.   06/02/2017 at Unknown time  . albuterol (PROVENTIL HFA;VENTOLIN HFA) 108 (90 BASE) MCG/ACT inhaler Inhale 1-2 puffs into the lungs every 6 (six) hours as needed for wheezing or shortness of breath. (Patient taking differently: Inhale 2 puffs into the lungs every 6 (six) hours as needed for wheezing or shortness of breath. ) 1 Inhaler 0 Past Month at Unknown time  . Doxylamine-Pyridoxine 10-10  MG TBEC Take 2 tabs at bedtime. If symptoms persist after 2 days, take 1 tab in the morning and 2 tabs at bedtime 100 tablet 6 Past Month at Unknown time  . Prenatal Vit-Fe Phos-FA-Omega (VITAFOL GUMMIES) 3.33-0.333-34.8 MG CHEW Chew 3 tablets by mouth daily before breakfast. (Patient taking differently: Chew 2 tablets by mouth daily before breakfast. ) 90 tablet 11 06/03/2017 at Unknown time  . tetrahydrozoline-zinc (VISINE-AC) 0.05-0.25 % ophthalmic solution Place 2 drops into both eyes 3 (three) times daily as needed (for dryness/redness).   Past Month at Unknown time    Review of Systems  Constitutional: Negative for fever.  Gastrointestinal: Positive for abdominal pain. Negative for constipation, diarrhea, nausea and vomiting.  Genitourinary: Negative for vaginal bleeding and vaginal discharge.   Physical Exam   Blood pressure 130/69, pulse 89, temperature 99.7 F (37.6 C), temperature source Oral, resp. rate 16, height 5\' 5"  (1.651 m), weight 260 lb (117.9 kg), last menstrual period 10/04/2016.  Physical Exam  Nursing note and vitals reviewed. Constitutional: She is oriented to person, place, and time. She appears well-developed and well-nourished. No distress.  HENT:  Head: Normocephalic and atraumatic.  Cardiovascular: Normal rate.   Respiratory: Effort normal.  GI: Soft. She exhibits no distension and no mass.  There is tenderness (mild tenderness to palpation of the LLQ ). There is no rebound and no guarding.  Musculoskeletal: She exhibits no edema.  Neurological: She is alert and oriented to person, place, and time. She has normal reflexes.  No clonus  Skin: Skin is warm and dry. No erythema.  Psychiatric: She has a normal mood and affect.    Results for orders placed or performed during the hospital encounter of 06/03/17 (from the past 24 hour(s))  Urinalysis, Routine w reflex microscopic     Status: Abnormal   Collection Time: 06/03/17  3:44 PM  Result Value Ref Range    Color, Urine YELLOW YELLOW   APPearance CLEAR CLEAR   Specific Gravity, Urine 1.018 1.005 - 1.030   pH 6.0 5.0 - 8.0   Glucose, UA NEGATIVE NEGATIVE mg/dL   Hgb urine dipstick NEGATIVE NEGATIVE   Bilirubin Urine NEGATIVE NEGATIVE   Ketones, ur 20 (A) NEGATIVE mg/dL   Protein, ur NEGATIVE NEGATIVE mg/dL   Nitrite NEGATIVE NEGATIVE   Leukocytes, UA NEGATIVE NEGATIVE  Protein / creatinine ratio, urine     Status: None   Collection Time: 06/03/17  3:44 PM  Result Value Ref Range   Creatinine, Urine 116.00 mg/dL   Total Protein, Urine 16 mg/dL   Protein Creatinine Ratio 0.14 0.00 - 0.15 mg/mg[Cre]  CBC with Differential     Status: Abnormal   Collection Time: 06/03/17  4:16 PM  Result Value Ref Range   WBC 8.9 4.0 - 10.5 K/uL   RBC 3.77 (L) 3.87 - 5.11 MIL/uL   Hemoglobin 11.3 (L) 12.0 - 15.0 g/dL   HCT 16.1 (L) 09.6 - 04.5 %   MCV 88.3 78.0 - 100.0 fL   MCH 30.0 26.0 - 34.0 pg   MCHC 33.9 30.0 - 36.0 g/dL   RDW 40.9 81.1 - 91.4 %   Platelets 204 150 - 400 K/uL   Neutrophils Relative % 60 %   Neutro Abs 5.4 1.7 - 7.7 K/uL   Lymphocytes Relative 29 %   Lymphs Abs 2.5 0.7 - 4.0 K/uL   Monocytes Relative 8 %   Monocytes Absolute 0.7 0.1 - 1.0 K/uL   Eosinophils Relative 3 %   Eosinophils Absolute 0.3 0.0 - 0.7 K/uL   Basophils Relative 0 %   Basophils Absolute 0.0 0.0 - 0.1 K/uL  Comprehensive metabolic panel     Status: Abnormal   Collection Time: 06/03/17  4:16 PM  Result Value Ref Range   Sodium 134 (L) 135 - 145 mmol/L   Potassium 3.7 3.5 - 5.1 mmol/L   Chloride 105 101 - 111 mmol/L   CO2 22 22 - 32 mmol/L   Glucose, Bld 74 65 - 99 mg/dL   BUN <5 (L) 6 - 20 mg/dL   Creatinine, Ser 7.82 0.44 - 1.00 mg/dL   Calcium 9.0 8.9 - 95.6 mg/dL   Total Protein 6.2 (L) 6.5 - 8.1 g/dL   Albumin 2.8 (L) 3.5 - 5.0 g/dL   AST 17 15 - 41 U/L   ALT 16 14 - 54 U/L   Alkaline Phosphatase 85 38 - 126 U/L   Total Bilirubin 0.3 0.3 - 1.2 mg/dL   GFR calc non Af Amer >60 >60 mL/min   GFR  calc Af Amer >60 >60 mL/min   Anion gap 7 5 - 15   Fetal Monitoring: Baseline: 130 bpm Variability: moderate Accelerations: 10 x 10 Decelerations: none Contractions: q 2-4 minutes, resolved  Patient Vitals for the past  24 hrs:  BP Temp Temp src Pulse Resp Height Weight  06/03/17 1731 130/69 - - - - - -  06/03/17 1556 140/74 99.7 F (37.6 C) Oral 89 16 5\' 5"  (1.651 m) 260 lb (117.9 kg)    MAU Course  Procedures None  MDM UA today - + ketones 1 liter IV bolus given  CBC, CMP and urine protein/creatinine ordered 10 mg Procardia q 20 minutes PRN x 3  BMZ given today  Patient reports improvement in contractions. UI noted on TOCO only. TOCO adjusted numerous times and no regular contractions noted.   Assessment and Plan  A: SIUP at 4273w4d Preterm contractions   P: Discharge home Preterm labor precautions discussed Patient advised to follow-up in MAU tomorrow afternoon for second BMZ injection and follow-up with [facility] as scheduled for routine prenatal care Patient may return to MAU as needed or if her condition were to change or worsen  Vonzella NippleJulie Welby Montminy, PA-C 06/03/2017, 6:38 PM

## 2017-06-04 ENCOUNTER — Encounter: Payer: Self-pay | Admitting: Obstetrics

## 2017-06-04 ENCOUNTER — Inpatient Hospital Stay (HOSPITAL_COMMUNITY)
Admission: AD | Admit: 2017-06-04 | Discharge: 2017-06-04 | Disposition: A | Discharge status: Home / Self Care | Payer: BLUE CROSS/BLUE SHIELD | Source: Ambulatory Visit | Attending: Obstetrics and Gynecology | Admitting: Obstetrics and Gynecology

## 2017-06-04 DIAGNOSIS — Z3A36 36 weeks gestation of pregnancy: Secondary | ICD-10-CM | POA: Insufficient documentation

## 2017-06-04 MED ORDER — BETAMETHASONE SOD PHOS & ACET 6 (3-3) MG/ML IJ SUSP
12.0000 mg | Freq: Once | INTRAMUSCULAR | Status: AC
  Administered 2017-06-04: 12 mg via INTRAMUSCULAR
  Filled 2017-06-04: qty 2

## 2017-06-04 NOTE — MAU Note (Signed)
Patient here for second BMZ injection.  Denies any complaints at this time.

## 2017-06-10 ENCOUNTER — Ambulatory Visit (INDEPENDENT_AMBULATORY_CARE_PROVIDER_SITE_OTHER): Payer: BLUE CROSS/BLUE SHIELD | Admitting: Obstetrics

## 2017-06-10 VITALS — BP 130/86 | HR 108 | Wt 263.7 lb

## 2017-06-10 DIAGNOSIS — Z3480 Encounter for supervision of other normal pregnancy, unspecified trimester: Secondary | ICD-10-CM

## 2017-06-10 DIAGNOSIS — Z3483 Encounter for supervision of other normal pregnancy, third trimester: Secondary | ICD-10-CM

## 2017-06-10 NOTE — Progress Notes (Signed)
Patient reports good fetal movement, with contractions that come and go. 

## 2017-06-11 ENCOUNTER — Encounter: Payer: Self-pay | Admitting: Obstetrics

## 2017-06-11 LAB — OB RESULTS CONSOLE GBS: GBS: POSITIVE

## 2017-06-11 NOTE — Progress Notes (Signed)
Subjective:  Amanda Melton is a 31 y.o. [phone] at 6733w5d being seen today for ongoing prenatal care.  She is currently monitored for the following issues for this low-risk pregnancy and has Postpartum hemorrhage; Breakthrough bleeding on depo provera; Excessive or frequent menstruation; Vaginitis and vulvovaginitis, unspecified; Abnormal uterine bleeding (AUB); Obesity; and Supervision of normal pregnancy, antepartum on her problem list.  Patient reports no complaints.  Contractions: Irregular. Vag. Bleeding: None.  Movement: Present. Denies leaking of fluid.   The following portions of the patient's history were reviewed and updated as appropriate: allergies, current medications, past family history, past medical history, past social history, past surgical history and problem list. Problem list updated.  Objective:   Vitals:   06/10/17 1103  BP: 130/86  Pulse: (!) 108  Weight: 263 lb 11.2 oz (119.6 kg)    Fetal Status: Fetal Heart Rate (bpm): 150   Movement: Present     General:  Alert, oriented and cooperative. Patient is in no acute distress.  Skin: Skin is warm and dry. No rash noted.   Cardiovascular: Normal heart rate noted  Respiratory: Normal respiratory effort, no problems with respiration noted  Abdomen: Soft, gravid, appropriate for gestational age. Pain/Pressure: Present     Pelvic:  Cervical exam deferred        Extremities: Normal range of motion.  Edema: Trace  Mental Status: Normal mood and affect. Normal behavior. Normal judgment and thought content.   Urinalysis:      Assessment and Plan:  Pregnancy: [id number] at 1633w5d  1. Supervision of other normal pregnancy, antepartum Rx: - Strep Gp B NAA  Preterm labor symptoms and general obstetric precautions including but not limited to vaginal bleeding, contractions, leaking of fluid and fetal movement were reviewed in detail with the patient. Please refer to After Visit Summary for other counseling recommendations.   Return in about 1 week (around 06/17/2017) for ROB.   Brock BadHarper, Marlos Carmen A, MD

## 2017-06-13 LAB — STREP GP B NAA: Strep Gp B NAA: POSITIVE — AB

## 2017-06-18 ENCOUNTER — Ambulatory Visit (INDEPENDENT_AMBULATORY_CARE_PROVIDER_SITE_OTHER): Payer: BLUE CROSS/BLUE SHIELD | Admitting: Obstetrics

## 2017-06-18 ENCOUNTER — Encounter: Payer: Self-pay | Admitting: Obstetrics

## 2017-06-18 VITALS — BP 137/84 | HR 98 | Wt 261.8 lb

## 2017-06-18 DIAGNOSIS — Z3483 Encounter for supervision of other normal pregnancy, third trimester: Secondary | ICD-10-CM

## 2017-06-18 DIAGNOSIS — Z3480 Encounter for supervision of other normal pregnancy, unspecified trimester: Secondary | ICD-10-CM

## 2017-06-18 DIAGNOSIS — M545 Low back pain, unspecified: Secondary | ICD-10-CM

## 2017-06-18 MED ORDER — CYCLOBENZAPRINE HCL 10 MG PO TABS: 10.0000 mg | ORAL_TABLET | Freq: Three times a day (TID) | ORAL | 2 refills | Status: DC | PRN

## 2017-06-18 NOTE — Progress Notes (Signed)
Patient reports good fetal movement with pressure and contractions that come and go. Advised pt of GBS results.

## 2017-06-18 NOTE — Progress Notes (Signed)
Subjective:  Amanda Melton is a 31 y.o. [phone] at 390w5d being seen today for ongoing prenatal care.  She is currently monitored for the following issues for this low-risk pregnancy and has Postpartum hemorrhage; Breakthrough bleeding on depo provera; Excessive or frequent menstruation; Vaginitis and vulvovaginitis, unspecified; Abnormal uterine bleeding (AUB); Obesity; and Supervision of normal pregnancy, antepartum on her problem list.  Patient reports backache.  Contractions: Irregular. Vag. Bleeding: None.  Movement: Present. Denies leaking of fluid.   The following portions of the patient's history were reviewed and updated as appropriate: allergies, current medications, past family history, past medical history, past social history, past surgical history and problem list. Problem list updated.  Objective:   Vitals:   06/18/17 1442  BP: 137/84  Pulse: 98  Weight: 261 lb 12.8 oz (118.8 kg)    Fetal Status: Fetal Heart Rate (bpm): 150   Movement: Present     General:  Alert, oriented and cooperative. Patient is in no acute distress.  Skin: Skin is warm and dry. No rash noted.   Cardiovascular: Normal heart rate noted  Respiratory: Normal respiratory effort, no problems with respiration noted  Abdomen: Soft, gravid, appropriate for gestational age. Pain/Pressure: Present     Pelvic:  Cervical exam deferred        Extremities: Normal range of motion.  Edema: Trace  Mental Status: Normal mood and affect. Normal behavior. Normal judgment and thought content.   Urinalysis:      Assessment and Plan:  Pregnancy: [id number] at 7190w5d  1. Supervision of other normal pregnancy, antepartum   2. Acute midline low back pain without sciatica Rx: - cyclobenzaprine (FLEXERIL) 10 MG tablet; Take 1 tablet (10 mg total) by mouth every 8 (eight) hours as needed for muscle spasms.  Dispense: 30 tablet; Refill: 2  Term labor symptoms and general obstetric precautions including but not limited to  vaginal bleeding, contractions, leaking of fluid and fetal movement were reviewed in detail with the patient. Please refer to After Visit Summary for other counseling recommendations.  Return in about 1 week (around 06/25/2017) for ROB.   Brock BadHarper, Charles A, MD

## 2017-06-21 ENCOUNTER — Inpatient Hospital Stay (HOSPITAL_COMMUNITY): Payer: BLUE CROSS/BLUE SHIELD | Admitting: Anesthesiology

## 2017-06-21 ENCOUNTER — Encounter (HOSPITAL_COMMUNITY): Payer: Self-pay

## 2017-06-21 ENCOUNTER — Inpatient Hospital Stay (HOSPITAL_COMMUNITY)
Admission: AD | Admit: 2017-06-21 | Discharge: 2017-06-23 | DRG: 775 | Disposition: A | Discharge status: Home / Self Care | Payer: BLUE CROSS/BLUE SHIELD | Source: Ambulatory Visit | Attending: Obstetrics and Gynecology | Admitting: Obstetrics and Gynecology

## 2017-06-21 DIAGNOSIS — Z3A37 37 weeks gestation of pregnancy: Secondary | ICD-10-CM | POA: Diagnosis not present

## 2017-06-21 DIAGNOSIS — J45909 Unspecified asthma, uncomplicated: Secondary | ICD-10-CM | POA: Diagnosis present

## 2017-06-21 DIAGNOSIS — O9952 Diseases of the respiratory system complicating childbirth: Secondary | ICD-10-CM | POA: Diagnosis present

## 2017-06-21 DIAGNOSIS — Z3480 Encounter for supervision of other normal pregnancy, unspecified trimester: Secondary | ICD-10-CM

## 2017-06-21 DIAGNOSIS — Z6841 Body Mass Index (BMI) 40.0 and over, adult: Secondary | ICD-10-CM

## 2017-06-21 DIAGNOSIS — O99824 Streptococcus B carrier state complicating childbirth: Secondary | ICD-10-CM | POA: Diagnosis present

## 2017-06-21 DIAGNOSIS — O99214 Obesity complicating childbirth: Secondary | ICD-10-CM | POA: Diagnosis present

## 2017-06-21 DIAGNOSIS — O411230 Chorioamnionitis, third trimester, not applicable or unspecified: Secondary | ICD-10-CM | POA: Diagnosis present

## 2017-06-21 DIAGNOSIS — Z23 Encounter for immunization: Secondary | ICD-10-CM | POA: Diagnosis not present

## 2017-06-21 DIAGNOSIS — Z3493 Encounter for supervision of normal pregnancy, unspecified, third trimester: Secondary | ICD-10-CM | POA: Diagnosis present

## 2017-06-21 LAB — CBC
HCT: 34.3 % — ABNORMAL LOW (ref 36.0–46.0)
HCT: 34.8 % — ABNORMAL LOW (ref 36.0–46.0)
HEMOGLOBIN: 11.5 g/dL — AB (ref 12.0–15.0)
HEMOGLOBIN: 11.7 g/dL — AB (ref 12.0–15.0)
MCH: 29 pg (ref 26.0–34.0)
MCH: 29.5 pg (ref 26.0–34.0)
MCHC: 33.5 g/dL (ref 30.0–36.0)
MCHC: 33.6 g/dL (ref 30.0–36.0)
MCV: 86.6 fL (ref 78.0–100.0)
MCV: 87.7 fL (ref 78.0–100.0)
PLATELETS: 187 10*3/uL (ref 150–400)
Platelets: 172 10*3/uL (ref 150–400)
RBC: 3.96 MIL/uL (ref 3.87–5.11)
RBC: 3.97 MIL/uL (ref 3.87–5.11)
RDW: 13.3 % (ref 11.5–15.5)
RDW: 13.3 % (ref 11.5–15.5)
WBC: 12 10*3/uL — AB (ref 4.0–10.5)
WBC: 10.4 10*3/uL (ref 4.0–10.5)

## 2017-06-21 LAB — TYPE AND SCREEN
ABO/RH(D): B POS
ANTIBODY SCREEN: NEGATIVE

## 2017-06-21 LAB — RPR: RPR: NONREACTIVE

## 2017-06-21 MED ORDER — LACTATED RINGERS IV SOLN: INTRAVENOUS | Status: DC

## 2017-06-21 MED ORDER — ONDANSETRON HCL 4 MG/2ML IJ SOLN: 4.0000 mg | INTRAMUSCULAR | Status: DC | PRN

## 2017-06-21 MED ORDER — MISOPROSTOL 200 MCG PO TABS
ORAL_TABLET | ORAL | Status: AC
  Filled 2017-06-21: qty 4

## 2017-06-21 MED ORDER — LACTATED RINGERS IV SOLN
INTRAVENOUS | Status: DC
  Administered 2017-06-21: 300 mL via INTRAUTERINE

## 2017-06-21 MED ORDER — OXYCODONE-ACETAMINOPHEN 5-325 MG PO TABS: 1.0000 | ORAL_TABLET | ORAL | Status: DC | PRN

## 2017-06-21 MED ORDER — ACETAMINOPHEN 325 MG PO TABS
650.0000 mg | ORAL_TABLET | ORAL | Status: DC | PRN
  Administered 2017-06-21: 650 mg via ORAL
  Filled 2017-06-21: qty 2

## 2017-06-21 MED ORDER — MISOPROSTOL 200 MCG PO TABS
800.0000 ug | ORAL_TABLET | Freq: Once | ORAL | Status: AC
  Administered 2017-06-21: 800 ug via RECTAL

## 2017-06-21 MED ORDER — DIPHENHYDRAMINE HCL 50 MG/ML IJ SOLN: 12.5000 mg | INTRAMUSCULAR | Status: DC | PRN

## 2017-06-21 MED ORDER — SOD CITRATE-CITRIC ACID 500-334 MG/5ML PO SOLN: 30.0000 mL | ORAL | Status: DC | PRN

## 2017-06-21 MED ORDER — DIBUCAINE 1 % RE OINT: 1.0000 "application " | TOPICAL_OINTMENT | RECTAL | Status: DC | PRN

## 2017-06-21 MED ORDER — PNEUMOCOCCAL VAC POLYVALENT 25 MCG/0.5ML IJ INJ
0.5000 mL | INJECTION | INTRAMUSCULAR | Status: AC
  Administered 2017-06-23: 0.5 mL via INTRAMUSCULAR
  Filled 2017-06-21 (×2): qty 0.5

## 2017-06-21 MED ORDER — OXYTOCIN 40 UNITS IN LACTATED RINGERS INFUSION - SIMPLE MED
2.5000 [IU]/h | INTRAVENOUS | Status: DC
  Filled 2017-06-21: qty 1000

## 2017-06-21 MED ORDER — LACTATED RINGERS IV SOLN
500.0000 mL | INTRAVENOUS | Status: DC | PRN
  Administered 2017-06-21: 500 mL via INTRAVENOUS

## 2017-06-21 MED ORDER — ZOLPIDEM TARTRATE 5 MG PO TABS: 5.0000 mg | ORAL_TABLET | Freq: Every evening | ORAL | Status: DC | PRN

## 2017-06-21 MED ORDER — DIPHENHYDRAMINE HCL 25 MG PO CAPS: 25.0000 mg | ORAL_CAPSULE | Freq: Four times a day (QID) | ORAL | Status: DC | PRN

## 2017-06-21 MED ORDER — PENICILLIN G POTASSIUM 5000000 UNITS IJ SOLR
5.0000 10*6.[IU] | Freq: Once | INTRAVENOUS | Status: AC
  Administered 2017-06-21: 5 10*6.[IU] via INTRAVENOUS
  Filled 2017-06-21: qty 5

## 2017-06-21 MED ORDER — EPHEDRINE 5 MG/ML INJ
10.0000 mg | INTRAVENOUS | Status: DC | PRN
  Filled 2017-06-21: qty 2

## 2017-06-21 MED ORDER — ONDANSETRON HCL 4 MG/2ML IJ SOLN: 4.0000 mg | Freq: Four times a day (QID) | INTRAMUSCULAR | Status: DC | PRN

## 2017-06-21 MED ORDER — PENICILLIN G POT IN DEXTROSE 60000 UNIT/ML IV SOLN
3.0000 10*6.[IU] | INTRAVENOUS | Status: DC
  Filled 2017-06-21 (×4): qty 50

## 2017-06-21 MED ORDER — LIDOCAINE HCL (PF) 1 % IJ SOLN
30.0000 mL | INTRAMUSCULAR | Status: AC | PRN
  Administered 2017-06-21: 30 mL via SUBCUTANEOUS
  Filled 2017-06-21: qty 30

## 2017-06-21 MED ORDER — LACTATED RINGERS IV SOLN
500.0000 mL | Freq: Once | INTRAVENOUS | Status: AC
  Administered 2017-06-21: 500 mL via INTRAVENOUS

## 2017-06-21 MED ORDER — COCONUT OIL OIL: 1.0000 "application " | TOPICAL_OIL | Status: DC | PRN

## 2017-06-21 MED ORDER — ONDANSETRON HCL 4 MG PO TABS: 4.0000 mg | ORAL_TABLET | ORAL | Status: DC | PRN

## 2017-06-21 MED ORDER — FENTANYL 2.5 MCG/ML BUPIVACAINE 1/10 % EPIDURAL INFUSION (WH - ANES): 14.0000 mL/h | INTRAMUSCULAR | Status: DC | PRN

## 2017-06-21 MED ORDER — ACETAMINOPHEN 325 MG PO TABS
650.0000 mg | ORAL_TABLET | ORAL | Status: DC | PRN
  Administered 2017-06-22: 650 mg via ORAL
  Filled 2017-06-21: qty 2

## 2017-06-21 MED ORDER — BENZOCAINE-MENTHOL 20-0.5 % EX AERO: 1.0000 "application " | INHALATION_SPRAY | CUTANEOUS | Status: DC | PRN

## 2017-06-21 MED ORDER — FLEET ENEMA 7-19 GM/118ML RE ENEM: 1.0000 | ENEMA | RECTAL | Status: DC | PRN

## 2017-06-21 MED ORDER — PRENATAL MULTIVITAMIN CH
1.0000 | ORAL_TABLET | Freq: Every day | ORAL | Status: DC
  Administered 2017-06-21 – 2017-06-22 (×2): 1 via ORAL
  Filled 2017-06-21 (×2): qty 1

## 2017-06-21 MED ORDER — SENNOSIDES-DOCUSATE SODIUM 8.6-50 MG PO TABS
2.0000 | ORAL_TABLET | ORAL | Status: DC
  Administered 2017-06-22 – 2017-06-23 (×2): 2 via ORAL
  Filled 2017-06-21 (×2): qty 2

## 2017-06-21 MED ORDER — OXYTOCIN BOLUS FROM INFUSION
500.0000 mL | Freq: Once | INTRAVENOUS | Status: AC
  Administered 2017-06-21: 500 mL via INTRAVENOUS

## 2017-06-21 MED ORDER — PHENYLEPHRINE 40 MCG/ML (10ML) SYRINGE FOR IV PUSH (FOR BLOOD PRESSURE SUPPORT)
80.0000 ug | PREFILLED_SYRINGE | INTRAVENOUS | Status: DC | PRN
  Filled 2017-06-21: qty 5
  Filled 2017-06-21: qty 10

## 2017-06-21 MED ORDER — FENTANYL 2.5 MCG/ML BUPIVACAINE 1/10 % EPIDURAL INFUSION (WH - ANES)
14.0000 mL/h | INTRAMUSCULAR | Status: DC | PRN
  Administered 2017-06-21 (×2): 14 mL/h via EPIDURAL
  Filled 2017-06-21: qty 100

## 2017-06-21 MED ORDER — TETANUS-DIPHTH-ACELL PERTUSSIS 5-2.5-18.5 LF-MCG/0.5 IM SUSP: 0.5000 mL | Freq: Once | INTRAMUSCULAR | Status: DC

## 2017-06-21 MED ORDER — LIDOCAINE HCL (PF) 1 % IJ SOLN
INTRAMUSCULAR | Status: DC | PRN
  Administered 2017-06-21 (×2): 4 mL

## 2017-06-21 MED ORDER — FENTANYL CITRATE (PF) 100 MCG/2ML IJ SOLN: 50.0000 ug | INTRAMUSCULAR | Status: DC | PRN

## 2017-06-21 MED ORDER — OXYCODONE-ACETAMINOPHEN 5-325 MG PO TABS: 2.0000 | ORAL_TABLET | ORAL | Status: DC | PRN

## 2017-06-21 MED ORDER — PHENYLEPHRINE 40 MCG/ML (10ML) SYRINGE FOR IV PUSH (FOR BLOOD PRESSURE SUPPORT)
80.0000 ug | PREFILLED_SYRINGE | INTRAVENOUS | Status: DC | PRN
  Filled 2017-06-21: qty 5

## 2017-06-21 MED ORDER — SIMETHICONE 80 MG PO CHEW: 80.0000 mg | CHEWABLE_TABLET | ORAL | Status: DC | PRN

## 2017-06-21 MED ORDER — WITCH HAZEL-GLYCERIN EX PADS: 1.0000 "application " | MEDICATED_PAD | CUTANEOUS | Status: DC | PRN

## 2017-06-21 MED ORDER — IBUPROFEN 600 MG PO TABS
600.0000 mg | ORAL_TABLET | Freq: Four times a day (QID) | ORAL | Status: DC
  Administered 2017-06-21 – 2017-06-23 (×8): 600 mg via ORAL
  Filled 2017-06-21 (×8): qty 1

## 2017-06-21 NOTE — Anesthesia Procedure Notes (Signed)
Epidural Patient location during procedure: OB  Staffing Anesthesiologist: Lewie Loron Performed: anesthesiologist   Preanesthetic Checklist Completed: patient identified, pre-op evaluation, timeout performed, IV checked, risks and benefits discussed and monitors and equipment checked  Epidural Patient position: sitting Prep: site prepped and draped and DuraPrep Patient monitoring: heart rate, continuous pulse ox and blood pressure Approach: midline Location: L2-L3 Injection technique: LOR air and LOR saline  Needle:  Needle type: Tuohy  Needle gauge: 17 G Needle length: 9 cm Needle insertion depth: 7 cm Catheter type: closed end flexible Catheter size: 19 Gauge Catheter at skin depth: 12 cm Test dose: negative  Assessment Sensory level: T8 Events: blood not aspirated, injection not painful, no injection resistance, negative IV test and no paresthesia  Additional Notes Reason for block:procedure for pain

## 2017-06-21 NOTE — Anesthesia Postprocedure Evaluation (Signed)
Anesthesia Post Note  Patient: Amanda Melton  Procedure(s) Performed: * No procedures listed *     Patient location during evaluation: [facility] Anesthesia Type: Epidural Level of consciousness: awake Pain management: pain level controlled Vital Signs Assessment: post-procedure vital signs reviewed and stable Respiratory status: spontaneous breathing Cardiovascular status: stable Postop Assessment: no headache, no backache, epidural receding, patient able to bend at knees, no signs of nausea or vomiting and adequate PO intake Anesthetic complications: no    Last Vitals:  Vitals:   06/21/17 1437 06/21/17 1620  BP: 136/73   Pulse: (!) 114   Resp: 18   Temp: (!) 38.3 C 37.1 C  SpO2: 98%     Last Pain:  Vitals:   06/21/17 1620  TempSrc: Oral  PainSc:    Pain Goal: Patients Stated Pain Goal: 0 (06/21/17 1350)               Nasier Thumm

## 2017-06-21 NOTE — MAU Note (Signed)
Contractions since yesterday afternoon. 3 cm , 2 weeks ago.  Ctxs every 5 min now. No leaking. No bleeding. Baby moving well.

## 2017-06-21 NOTE — H&P (Signed)
LABOR AND DELIVERY ADMISSION HISTORY AND PHYSICAL NOTE  Amanda Melton is a 31 y.o. female [phone] with IUP at [redacted]w[redacted]d by LMP presenting for SOL.   She reports positive fetal movement. She denies leakage of fluid or vaginal bleeding. She reports strong contractions every 4-5 minutes apart. She has had 2 prior vaginal delivery without issue. No issues with this current pregnancy.   Prenatal History/Complications:  Past Medical History: Past Medical History:  Diagnosis Date  . Asthma   . Chlamydia 2005  . Environmental allergies   . Hypertension    gestational hypertension    Past Surgical History: Past Surgical History:  Procedure Laterality Date  . NO PAST SURGERIES      Obstetrical History: OB History    Gravida Para Term Preterm AB Living   4 3 3  0 1 2   SAB TAB Ectopic Multiple Live Births   1 0 0 0 2      Social History: Social History   Social History  . Marital status: Married    Spouse name: N/A  . Number of children: 2  . Years of education: N/A   Occupational History  .  [employer]   Social History Main Topics  . Smoking status: Never Smoker  . Smokeless tobacco: Never Used  . Alcohol use No     Comment: occasionally  . Drug use: No  . Sexual activity: Yes    Partners: Male    Birth control/ protection: None   Other Topics Concern  . None   Social History Narrative  . None    Family History: Family History  Problem Relation Age of Onset  . Diabetes Father   . Hypertension Father   . Kidney failure Father   . Anesthesia problems Neg Hx     Allergies: No Known Allergies  Prescriptions Prior to Admission  Medication Sig Dispense Refill Last Dose  . acetaminophen (TYLENOL) 500 MG tablet Take 1,000 mg by mouth every 8 (eight) hours as needed for mild pain.   Past Week at Unknown time  . Prenatal Vit-Fe Phos-FA-Omega (VITAFOL GUMMIES) 3.33-0.333-34.8 MG CHEW Chew 3 tablets by mouth daily before breakfast. (Patient taking differently:  Chew 2 tablets by mouth daily before breakfast. ) 90 tablet 11 06/20/2017 at Unknown time  . albuterol (PROVENTIL HFA;VENTOLIN HFA) 108 (90 BASE) MCG/ACT inhaler Inhale 1-2 puffs into the lungs every 6 (six) hours as needed for wheezing or shortness of breath. (Patient not taking: Reported on 06/10/2017) 1 Inhaler 0 More than a month at Unknown time  . cyclobenzaprine (FLEXERIL) 10 MG tablet Take 1 tablet (10 mg total) by mouth every 8 (eight) hours as needed for muscle spasms. 30 tablet 2   . Doxylamine-Pyridoxine 10-10 MG TBEC Take 2 tabs at bedtime. If symptoms persist after 2 days, take 1 tab in the morning and 2 tabs at bedtime (Patient not taking: Reported on 06/10/2017) 100 tablet 6 Not Taking  . tetrahydrozoline-zinc (VISINE-AC) 0.05-0.25 % ophthalmic solution Place 2 drops into both eyes 3 (three) times daily as needed (for dryness/redness).   Not Taking     Review of Systems   All systems reviewed and negative except as stated in HPI  Blood pressure 135/75, pulse (!) 103, temperature 98.1 F (36.7 C), temperature source Oral, resp. rate 18, last menstrual period 10/04/2016, SpO2 96 %, unknown if currently breastfeeding. General appearance: alert, cooperative and no distress Lungs: no respiratory distress Heart: regular rate Abdomen: soft, non-tender Extremities: No calf swelling or  tenderness Presentation: cephalic by nurse exam Fetal monitoring: baseline 160, moderate variability, accelerations present, variable decels Uterine activity: q2-3 min Dilation: 5 Effacement (%): 70 Station: -2 Exam by:: benji stanleyRN   Prenatal labs: ABO, Rh: --/--/B POS (08/19 0710) Antibody: PENDING (08/19 0710) Rubella: immune RPR: Non Reactive (06/11 1126)  HBsAg: Negative (02/19 1636)  HIV:   NR GBS: Positive (08/09 0744)  Genetic screening:  normal Anatomy US: normal  Prenatal Transfer Tool  Maternal Diabetes: No Genetic Screening: Normal Maternal Ultrasounds/Referrals: Normal Fetal  Ultrasounds or other Referrals:  Referred to Materal Fetal Medicine  Maternal Substance Abuse:  No Significant Maternal Medications:  None Significant Maternal Lab Results: None  Results for orders placed or performed during the hospital encounter of 06/21/17 (from the past 24 hour(s))  CBC   Collection Time: 06/21/17  7:10 AM  Result Value Ref Range   WBC 12.0 (H) 4.0 - 10.5 K/uL   RBC 3.96 3.87 - 5.11 MIL/uL   Hemoglobin 11.5 (L) 12.0 - 15.0 g/dL   HCT 07.3 (L) 71.0 - 62.6 %   MCV 86.6 78.0 - 100.0 fL   MCH 29.0 26.0 - 34.0 pg   MCHC 33.5 30.0 - 36.0 g/dL   RDW 94.8 54.6 - 27.0 %   Platelets 187 150 - 400 K/uL  Type and screen [facility]   Collection Time: 06/21/17  7:10 AM  Result Value Ref Range   ABO/RH(D) B POS    Antibody Screen PENDING    Sample Expiration 06/24/2017     Patient Active Problem List   Diagnosis Date Noted  . Normal labor 06/21/2017  . Supervision of normal pregnancy, antepartum 12/22/2016  . Obesity 12/09/2013  . Abnormal uterine bleeding (AUB) 04/09/2013  . Excessive or frequent menstruation 04/07/2013  . Vaginitis and vulvovaginitis, unspecified 04/07/2013  . Breakthrough bleeding on depo provera 09/19/2012  . Postpartum hemorrhage 07/23/2012    Assessment: Amanda Melton is a 31 y.o. [id number] at [redacted]w[redacted]d here for SOL  #Labor: progressing naturally #Pain: Desires epidural #FWB: cat 1 #ID:  GBS positive #MOF: bottle #MOC: OCP #Circ:  n/a  Tillman Sers, DO PGY-2 8/19/20187:54 AM  OB FELLOW HISTORY AND PHYSICAL ATTESTATION  I have seen and examined this patient; I agree with above documentation in the resident's note.   Had prolonged decel shortly after admission. Recovered with interventions. ?ROM  Frederik Pear, MD OB Fellow 06/21/2017, 8:49 AM

## 2017-06-21 NOTE — Progress Notes (Signed)
Patient reports some LOF. RN noted same with last check. FHR baseline 175, mode variability, no acel, recurrent variables, cat II  Forebag note on SVE, ruptures and IUPC placed. Will start amnioinfusion.  Raynelle Fanning P. Degele, MD OB Fellow

## 2017-06-21 NOTE — Progress Notes (Signed)
1093 DR Degele at [facility]. Upon walking in room, pt stats she thinks her water has broke and she feels wetness coming out. Membranes noted at vagina and fluid leaking steadily. Color is dark red blood. Will continue to monitor. Forebag palpated when IUPC placed and was noted to be clear and light pink. Dr Nira Retort notified pt Is High PP hemorrhage risk. No new orders or blood needed on standby at this time but will consider meds at delivery and rapid pitocin administration after birth of baby.

## 2017-06-21 NOTE — Anesthesia Preprocedure Evaluation (Signed)
Anesthesia Evaluation  Patient identified by MRN, date of birth, ID band Patient awake    Reviewed: Allergy & Precautions, H&P , Patient's Chart, lab work & pertinent test results  Airway Mallampati: III  TM Distance: >3 FB Neck ROM: full    Dental  (+) Teeth Intact   Pulmonary asthma ,    breath sounds clear to auscultation       Cardiovascular hypertension, Pt. on medications  Rhythm:regular Rate:Normal     Neuro/Psych    GI/Hepatic   Endo/Other  Morbid obesity  Renal/GU      Musculoskeletal   Abdominal (+) + obese,   Peds  Hematology   Anesthesia Other Findings       Reproductive/Obstetrics (+) Pregnancy                             Anesthesia Physical  Anesthesia Plan  ASA: III  Anesthesia Plan: Epidural   Post-op Pain Management:    Induction:   PONV Risk Score and Plan:   Airway Management Planned:   Additional Equipment:   Intra-op Plan:   Post-operative Plan:   Informed Consent: I have reviewed the patients History and Physical, chart, labs and discussed the procedure including the risks, benefits and alternatives for the proposed anesthesia with the patient or authorized representative who has indicated his/her understanding and acceptance.   Dental Advisory Given  Plan Discussed with:   Anesthesia Plan Comments:         Anesthesia Quick Evaluation

## 2017-06-21 NOTE — Progress Notes (Signed)
6812 Dr Degele at [facility], reviewed FHR tracing from now to beginning. showm FHR baseline 170's, vbl decels, oxygen placed, iv fluid bolus, sve 6-7/90-1-2. Reviewed prolonged decel and spoke to patient. No new orders received. Pt currently receiving PCN prophylaxis.

## 2017-06-22 NOTE — Progress Notes (Signed)
Post Partum Day 1 Subjective: up ad lib, voiding, tolerating PO and reports abdominal and vaginal pain  Objective: Blood pressure 132/79, pulse 93, temperature 98.5 F (36.9 C), temperature source Oral, resp. rate 16, height 5\' 5"  (1.651 m), weight 262 lb (118.8 kg), last menstrual period 10/04/2016, SpO2 98 %, unknown if currently breastfeeding.  Physical Exam:  General: alert, cooperative and appears stated age Lochia: appropriate Uterine Fundus: firm, mildly tender Vaginal: no swelling, non-tender DVT Evaluation: Negative Homan's sign.   Recent Labs  06/21/17 0710 06/21/17 1155  HGB 11.5* 11.7*  HCT 34.3* 34.8*    Assessment/Plan: Plan for discharge tomorrow and Contraception undecided  Formula feeding   LOS: 1 day   Amanda Melton 06/22/2017, 10:13 AM

## 2017-06-23 DIAGNOSIS — Z3483 Encounter for supervision of other normal pregnancy, third trimester: Secondary | ICD-10-CM

## 2017-06-23 MED ORDER — IBUPROFEN 600 MG PO TABS: 600.0000 mg | ORAL_TABLET | Freq: Four times a day (QID) | ORAL | 0 refills | Status: DC

## 2017-06-23 MED ORDER — NORGESTIMATE-ETH ESTRADIOL 0.25-35 MG-MCG PO TABS: 1.0000 | ORAL_TABLET | Freq: Every day | ORAL | 11 refills | Status: DC

## 2017-06-23 NOTE — Discharge Summary (Signed)
OB Discharge Summary  Patient Name: Amanda Melton DOB: [birth year] MRN: [id number]  Date of admission: 06/21/2017 Delivering MD: Frederik Pear   Date of discharge: 06/23/2017  Admitting diagnosis: 37wks, contractions, and pressure Intrauterine pregnancy: [redacted]w[redacted]d     Secondary diagnosis:Active Problems:   Normal labor  Additional problems:h/o pph     Discharge diagnosis: Term Pregnancy Delivered                                                                   Post partum procedures:none  Augmentation: Foley Balloon  Complications: Intrauterine Inflammation or infection (Chorioamniotis)  Hospital course:  Onset of Labor With Vaginal Delivery     31 y.o. yo [id number] at [redacted]w[redacted]d was admitted in Active Labor on 06/21/2017. Patient had an uncomplicated labor course as follows:  Membrane Rupture Time/Date: 9:44 AM ,06/21/2017   Intrapartum Procedures: Episiotomy: None [1]                                         Lacerations:  Labial [10]  Patient had a delivery of a Viable infant. 06/21/2017  Information for the patient's newborn:  Amanda, Zeiger Girl Melton [[id number]]  Delivery Method: Vaginal, Spontaneous Delivery (Filed from Delivery Summary)    Pateint had an uncomplicated postpartum course. She had fever immediately postpartum which then resolved. She is ambulating, tolerating a regular diet, passing flatus, and urinating well. Patient is discharged home in stable condition on 06/23/17.   Physical exam  Vitals:   06/22/17 2100 06/23/17 0105 06/23/17 0722 06/23/17 0850  BP: 134/78 132/77  126/71  Pulse: 95 92  84  Resp: 16 16 18 18   Temp: 97.7 F (36.5 C) (!) 97.4 F (36.3 C)  98.4 F (36.9 C)  TempSrc: Oral Oral  Oral  SpO2: 99% 99%  99%  Weight:      Height:       General: alert, cooperative and no distress Lochia: appropriate Uterine Fundus: firm Incision: Healing well with no significant drainage DVT Evaluation: No evidence of DVT seen on physical exam. Labs: Lab  Results  Component Value Date   WBC 10.4 06/21/2017   HGB 11.7 (L) 06/21/2017   HCT 34.8 (L) 06/21/2017   MCV 87.7 06/21/2017   PLT 172 06/21/2017   CMP Latest Ref Rng & Units 06/03/2017  Glucose 65 - 99 mg/dL 74  BUN 6 - 20 mg/dL <3(Z)  Creatinine 3.29 - 1.00 mg/dL 9.24  Sodium 268 - 341 mmol/L 134(L)  Potassium 3.5 - 5.1 mmol/L 3.7  Chloride 101 - 111 mmol/L 105  CO2 22 - 32 mmol/L 22  Calcium 8.9 - 10.3 mg/dL 9.0  Total Protein 6.5 - 8.1 g/dL 6.2(L)  Total Bilirubin 0.3 - 1.2 mg/dL 0.3  Alkaline Phos 38 - 126 U/L 85  AST 15 - 41 U/L 17  ALT 14 - 54 U/L 16    Discharge instruction: per After Visit Summary and "Baby and Me Booklet".  After Visit Meds:  Allergies as of 06/23/2017   No Known Allergies     Medication List    TAKE these medications   acetaminophen 500 MG tablet Commonly known  as:  TYLENOL Take 1,000 mg by mouth every 8 (eight) hours as needed for mild pain.   albuterol 108 (90 Base) MCG/ACT inhaler Commonly known as:  PROVENTIL HFA;VENTOLIN HFA Inhale 1-2 puffs into the lungs every 6 (six) hours as needed for wheezing or shortness of breath.   ibuprofen 600 MG tablet Commonly known as:  ADVIL,MOTRIN Take 1 tablet (600 mg total) by mouth every 6 (six) hours.   norgestimate-ethinyl estradiol 0.25-35 MG-MCG tablet Commonly known as:  ORTHO-CYCLEN,SPRINTEC,PREVIFEM Take 1 tablet by mouth daily.   tetrahydrozoline-zinc 0.05-0.25 % ophthalmic solution Commonly known as:  VISINE-AC Place 2 drops into both eyes 3 (three) times daily as needed (for dryness/redness).   VITAFOL GUMMIES 3.33-0.333-34.8 MG Chew Chew 3 tablets by mouth daily before breakfast. What changed:  how much to take       Diet: routine diet  Activity: Advance as tolerated. Pelvic rest for 6 weeks.   Outpatient follow up:4 wks Follow up Appt:Future Appointments Date Time Provider Department Center  07/20/2017 1:30 PM Brock Bad, MD [facility] None   Follow up visit: No  Follow-up on file.  Postpartum contraception: Combination OCPs  Newborn Data: Live born female  Birth Weight: 6 lb 10.2 oz (3010 g) APGAR: 4, 7  Baby Feeding: Bottle Disposition:NICU   06/23/2017 Reva Bores, MD

## 2017-06-23 NOTE — Clinical Social Work Maternal (Signed)
  CLINICAL SOCIAL WORK MATERNAL/CHILD NOTE  Patient Details  Name: Amanda Melton MRN: [id number] Date of Birth: 04-Aug-1986  Date:  06/23/2017  Clinical Social Worker Initiating Note:  Laurey Arrow Date/ Time Initiated:  06/23/17/1232     Child's Name:  Amanda Melton   Legal Guardian:  Mother (FOB is Creig Hines)   Need for Interpreter:  None   Date of Referral:  06/22/17     Reason for Referral:  Other (Comment) (NICU admission for Code Apagar)   Referral Source:  NICU   Address:  [place] [address]  Phone number:  [phone]   Household Members:  Self, Minor Children, Spouse (MOB's older sons are 31 and 67 years of age. )   Natural Supports (not living in the home):  Immediate Family, Extended Family, Parent   Professional Supports: None   Employment: Full-time   Type of Work: Theme park manager   Education:  Database administrator Resources:  Multimedia programmer   Other Resources:  [employer], Physicist, medical    Cultural/Religious Considerations Which May Impact Care:    Strengths:  Ability to meet basic needs , Pediatrician chosen , Home prepared for child    Risk Factors/Current Problems:  Mental Health Concerns  (Recent loss of MOB's father in law (06/01/17).)   Cognitive State:  Able to Concentrate , Alert , Linear Thinking , Insightful , Goal Oriented    Mood/Affect:  Calm , Happy , Interested , Comfortable , Relaxed    CSW Assessment: CSW met with MOB to complete an assessment for infants NICU admission due to a Code Apgar. MOB was inviting, polite, and cheerful.  MOB denied any MH and SA hx.  CSW inquired about barriers, needs, and concerns and MOB denied them.  MOB communicated that the family has everything they need for the baby.  CSW asked about MOB's thoughts and feelings regarding NICU admission.  MOB reported that this is the worst L&D she ever experienced, however, "I'm just thankful for the care we have  been receiving".  MOB denied having any negative thoughts and feelings about NICU admission and having to d/c without infant.  "I know she needs to be here to get better, so I just have to deal with it." CSW normalized and validated MOB's thoughts and feelings and encouraged MOB to reach out to CSW if needed.  CSW reviewed NICU visitation policy and encouraged MOB to visit as often as she likes.  CSW will continue to assess for psychosocial stressors while infant remains in NICU.    CSW provided education regarding Baby Blues vs PMADs and provided MOB with information about support groups held at [facility] encouraged MOB to evaluate her mental health throughout the postpartum period with the use of the New Mom Checklist developed by Postpartum Progress and notify a medical professional if symptoms arise.    CSW Plan/Description:  Information/Referral to [employer] , Psychosocial Support and Ongoing Assessment of Needs, Patient/Family Education    Laurey Arrow, MSW, [employer] Clinical Social Work [phone]  Dimple Nanas, LCSW 06/23/2017, 1:36 PM

## 2017-06-23 NOTE — Progress Notes (Signed)
Pt discharged to home with significant other.  Condition stable.  No equipment for home ordered at discharge.  Pt ambulated to NICU with plans to leave hospital from there.

## 2017-06-23 NOTE — Discharge Instructions (Signed)

## 2017-06-24 ENCOUNTER — Encounter: Payer: BLUE CROSS/BLUE SHIELD | Admitting: Obstetrics

## 2017-07-01 ENCOUNTER — Encounter: Payer: BLUE CROSS/BLUE SHIELD | Admitting: Obstetrics

## 2017-07-07 ENCOUNTER — Telehealth: Payer: Self-pay

## 2017-07-07 NOTE — Telephone Encounter (Signed)
FMLA forms completed. Left message on pt vm to inform that these are ready

## 2017-07-20 ENCOUNTER — Encounter: Payer: Self-pay | Admitting: Obstetrics

## 2017-07-20 ENCOUNTER — Ambulatory Visit (INDEPENDENT_AMBULATORY_CARE_PROVIDER_SITE_OTHER): Payer: BLUE CROSS/BLUE SHIELD | Admitting: Obstetrics

## 2017-07-20 DIAGNOSIS — Z1389 Encounter for screening for other disorder: Secondary | ICD-10-CM

## 2017-07-20 DIAGNOSIS — Z3009 Encounter for other general counseling and advice on contraception: Secondary | ICD-10-CM

## 2017-07-20 MED ORDER — LEVONORGESTREL 20 MCG/24HR IU IUD: INTRAUTERINE_SYSTEM | Freq: Once | INTRAUTERINE | Status: DC

## 2017-07-20 NOTE — Progress Notes (Signed)
Post Partum Exam  Amanda Melton is a 31 y.o. [phone] female who presents for a postpartum visit. She is 4 weeks postpartum following a spontaneous vaginal delivery. I have fully reviewed the prenatal and intrapartum course. The delivery was at 37.1  gestational weeks.  Anesthesia: epidural. Postpartum course has been good. Baby's course has been good. Baby is feeding by bottle - [place]. Bleeding no bleeding. Bowel function is normal. Bladder function is normal. Patient is not sexually active. Contraception method is OCP (estrogen/progesterone). Postpartum depression screening:neg  The following portions of the patient's history were reviewed and updated as appropriate: allergies, current medications, past family history, past medical history, past social history, past surgical history and problem list.  Review of Systems A comprehensive review of systems was negative.    Objective:  unknown if currently breastfeeding.  General:  alert and no distress   Breasts:  inspection negative, no nipple discharge or bleeding, no masses or nodularity palpable  Lungs: clear to auscultation bilaterally  Heart:  regular rate and rhythm, S1, S2 normal, no murmur, click, rub or gallop  Abdomen: soft, non-tender; bowel sounds normal; no masses,  no organomegaly   Assessment:    1. Postpartum care following vaginal delivery - doing well  2. Encounter for other general counseling and advice on contraception - taking Ortho Cyclen and is pleased  Plan:   1. Contraception: OCP (estrogen/progesterone) 2. Ortho Cyclen Rx 3. Follow up in: 6 months or as needed.  Annual.

## 2017-08-11 ENCOUNTER — Telehealth: Payer: Self-pay

## 2017-08-11 NOTE — Telephone Encounter (Signed)
Patient left message stating that she has been having heavy bleeding and bad cramping for about 2 weeks. She delivered on 8/19. She is currently taking ortho-cyclen. She is on her 4th week. Pt request for medication to stop bleeding, and something for pain. I called patient back and left a message on vm to return call.

## 2017-08-12 ENCOUNTER — Encounter: Payer: Self-pay | Admitting: *Deleted

## 2017-08-12 ENCOUNTER — Other Ambulatory Visit: Payer: Self-pay | Admitting: Obstetrics

## 2017-08-12 DIAGNOSIS — N944 Primary dysmenorrhea: Secondary | ICD-10-CM

## 2017-08-12 DIAGNOSIS — Z30011 Encounter for initial prescription of contraceptive pills: Secondary | ICD-10-CM

## 2017-08-12 MED ORDER — IBUPROFEN 800 MG PO TABS: 800.0000 mg | ORAL_TABLET | Freq: Three times a day (TID) | ORAL | 5 refills | Status: DC | PRN

## 2017-08-12 MED ORDER — NORGESTIMATE-ETH ESTRADIOL 0.25-35 MG-MCG PO TABS: 1.0000 | ORAL_TABLET | Freq: Every day | ORAL | 11 refills | Status: DC

## 2017-08-12 NOTE — Telephone Encounter (Signed)
Ortho Cyclen Rx. Ibuprofen Rx for pain.

## 2017-08-12 NOTE — Telephone Encounter (Signed)
Pt is requesting a rx to stop the bleeding. She is currently taking Ortho Cyclen, and is having abnormal bleeding with this.

## 2017-12-22 ENCOUNTER — Other Ambulatory Visit: Payer: Self-pay

## 2017-12-22 DIAGNOSIS — Z30011 Encounter for initial prescription of contraceptive pills: Secondary | ICD-10-CM

## 2017-12-22 MED ORDER — NORGESTIMATE-ETH ESTRADIOL 0.25-35 MG-MCG PO TABS: 1.0000 | ORAL_TABLET | Freq: Every day | ORAL | 11 refills | Status: DC

## 2018-01-25 ENCOUNTER — Other Ambulatory Visit (HOSPITAL_COMMUNITY)
Admission: RE | Admit: 2018-01-25 | Discharge: 2018-01-25 | Disposition: A | Discharge status: Home / Self Care | Payer: BLUE CROSS/BLUE SHIELD | Source: Ambulatory Visit | Attending: Obstetrics | Admitting: Obstetrics

## 2018-01-25 ENCOUNTER — Encounter: Payer: Self-pay | Admitting: Obstetrics

## 2018-01-25 ENCOUNTER — Ambulatory Visit (INDEPENDENT_AMBULATORY_CARE_PROVIDER_SITE_OTHER): Payer: BLUE CROSS/BLUE SHIELD | Admitting: Obstetrics

## 2018-01-25 VITALS — BP 129/87 | HR 58 | Ht 65.0 in | Wt 243.0 lb

## 2018-01-25 DIAGNOSIS — B9689 Other specified bacterial agents as the cause of diseases classified elsewhere: Secondary | ICD-10-CM | POA: Diagnosis not present

## 2018-01-25 DIAGNOSIS — Z01419 Encounter for gynecological examination (general) (routine) without abnormal findings: Secondary | ICD-10-CM

## 2018-01-25 DIAGNOSIS — N898 Other specified noninflammatory disorders of vagina: Secondary | ICD-10-CM

## 2018-01-25 DIAGNOSIS — R102 Pelvic and perineal pain: Secondary | ICD-10-CM

## 2018-01-25 DIAGNOSIS — N943 Premenstrual tension syndrome: Secondary | ICD-10-CM

## 2018-01-25 DIAGNOSIS — N760 Acute vaginitis: Secondary | ICD-10-CM | POA: Insufficient documentation

## 2018-01-25 DIAGNOSIS — N944 Primary dysmenorrhea: Secondary | ICD-10-CM

## 2018-01-25 MED ORDER — METRONIDAZOLE 500 MG PO TABS: 500.0000 mg | ORAL_TABLET | Freq: Two times a day (BID) | ORAL | 2 refills | Status: DC

## 2018-01-25 MED ORDER — IBUPROFEN 800 MG PO TABS: 800.0000 mg | ORAL_TABLET | Freq: Three times a day (TID) | ORAL | 5 refills | Status: DC | PRN

## 2018-01-25 MED ORDER — PAROXETINE HCL 20 MG PO TABS: 20.0000 mg | ORAL_TABLET | Freq: Every day | ORAL | 11 refills | Status: DC

## 2018-01-25 NOTE — Progress Notes (Addendum)
Subjective:        Amanda Melton is a 32 y.o. female here for a routine exam.  Current complaints: Malodorous vaginal discharge  Personal health questionnaire:  Is patient Ashkenazi Jewish, have a family history of breast and/or ovarian cancer: no Is there a family history of uterine cancer diagnosed at age < 67, gastrointestinal cancer, urinary tract cancer, family member who is a Personnel officer syndrome-associated carrier: no Is the patient overweight and hypertensive, family history of diabetes, personal history of gestational diabetes, preeclampsia or PCOS: no Is patient over 27, have PCOS,  family history of premature CHD under age 86, diabetes, smoke, have hypertension or peripheral artery disease:  no At any time, has a partner hit, kicked or otherwise hurt or frightened you?: no Over the past 2 weeks, have you felt down, depressed or hopeless?: no Over the past 2 weeks, have you felt little interest or pleasure in doing things?:no   Gynecologic History Patient's last menstrual period was 01/11/2018. Contraception: OCP (estrogen/progesterone) Last Pap: 2018. Results were: normal Last mammogram: n/a. Results were: n/a  Obstetric History OB History  Gravida Para Term Preterm AB Living  4 3 3  0 1 2  SAB TAB Ectopic Multiple Live Births  1 0 0 0 2    # Outcome Date GA Lbr Len/2nd Weight Sex Delivery Anes PTL Lv  4 Term 06/21/17 [redacted]w[redacted]d 05:05 / 00:10 6 lb 10.2 oz (3.01 kg) F Vag-Spont EPI  LIV  3 Term 07/23/12 [redacted]w[redacted]d 09:11 / 00:08 6 lb 3.2 oz (2.812 kg) M Vag-Spont EPI, Local  LIV     Birth Comments: none  2 Term 05/17/07    M Vag-Spont   LIV  1 SAB             Past Medical History:  Diagnosis Date  . Asthma   . Chlamydia 2005  . Environmental allergies   . Hypertension    gestational hypertension    Past Surgical History:  Procedure Laterality Date  . NO PAST SURGERIES       Current Outpatient Medications:  .  acetaminophen (TYLENOL) 500 MG tablet, Take 1,000 mg by  mouth every 8 (eight) hours as needed for mild pain., Disp: , Rfl:  .  albuterol (PROVENTIL HFA;VENTOLIN HFA) 108 (90 BASE) MCG/ACT inhaler, Inhale 1-2 puffs into the lungs every 6 (six) hours as needed for wheezing or shortness of breath., Disp: 1 Inhaler, Rfl: 0 .  ibuprofen (ADVIL,MOTRIN) 600 MG tablet, Take 1 tablet (600 mg total) by mouth every 6 (six) hours., Disp: 30 tablet, Rfl: 0 .  ibuprofen (ADVIL,MOTRIN) 800 MG tablet, Take 1 tablet (800 mg total) by mouth every 8 (eight) hours as needed., Disp: 30 tablet, Rfl: 5 .  norgestimate-ethinyl estradiol (ORTHO-CYCLEN,SPRINTEC,PREVIFEM) 0.25-35 MG-MCG tablet, Take 1 tablet by mouth daily., Disp: 1 Package, Rfl: 11 .  Prenatal Vit-Fe Phos-FA-Omega (VITAFOL GUMMIES) 3.33-0.333-34.8 MG CHEW, Chew 3 tablets by mouth daily before breakfast., Disp: 90 tablet, Rfl: 11 .  metroNIDAZOLE (FLAGYL) 500 MG tablet, Take 1 tablet (500 mg total) by mouth 2 (two) times daily., Disp: 14 tablet, Rfl: 2 .  PARoxetine (PAXIL) 20 MG tablet, Take 1 tablet (20 mg total) by mouth daily., Disp: 30 tablet, Rfl: 11 No Known Allergies  Social History   Tobacco Use  . Smoking status: Never Smoker  . Smokeless tobacco: Never Used  Substance Use Topics  . Alcohol use: No    Alcohol/week: 1.8 oz    Types: 3 Standard drinks or  equivalent per week    Comment: occasionally    Family History  Problem Relation Age of Onset  . Diabetes Father   . Hypertension Father   . Kidney failure Father   . Anesthesia problems Neg Hx       Review of Systems  Constitutional: negative for fatigue and weight loss Respiratory: negative for cough and wheezing Cardiovascular: negative for chest pain, fatigue and palpitations Gastrointestinal: negative for abdominal pain and change in bowel habits Musculoskeletal:negative for myalgias Neurological: negative for gait problems and tremors Behavioral/Psych: negative for abusive relationship, depression Endocrine: negative for  temperature intolerance    Genitourinary:negative for abnormal menstrual periods, genital lesions, hot flashes, sexual problems and vaginal discharge Integument/breast: negative for breast lump, breast tenderness, nipple discharge and skin lesion(s)    Objective:       BP 129/87   Pulse (!) 58   Ht 5\' 5"  (1.651 m)   Wt 243 lb (110.2 kg)   LMP 01/11/2018   BMI 40.44 kg/m  General:   alert  Skin:   no rash or abnormalities  Lungs:   clear to auscultation bilaterally  Heart:   regular rate and rhythm, S1, S2 normal, no murmur, click, rub or gallop  Breasts:   normal without suspicious masses, skin or nipple changes or axillary nodes  Abdomen:  normal findings: no organomegaly, soft, non-tender and no hernia  Pelvis:  External genitalia: normal general appearance Urinary system: urethral meatus normal and bladder without fullness, nontender Vaginal: normal without tenderness, induration or masses Cervix: normal appearance Adnexa: normal bimanual exam Uterus: anteverted and non-tender, normal size   Lab Review Urine pregnancy test Labs reviewed yes Radiologic studies reviewed yes  50% of 20 min visit spent on counseling and coordination of care.   Assessment:     1. Encounter for gynecological examination with Papanicolaou smear of cervix Rx: - Cytology - PAP  2. Vaginal discharge Rx: - Cervicovaginal ancillary only  3. BV (bacterial vaginosis) Rx: - metroNIDAZOLE (FLAGYL) 500 MG tablet; Take 1 tablet (500 mg total) by mouth 2 (two) times daily.  Dispense: 14 tablet; Refill: 2  4. Primary dysmenorrhea Rx; - ibuprofen (ADVIL,MOTRIN) 800 MG tablet; Take 1 tablet (800 mg total) by mouth every 8 (eight) hours as needed.  Dispense: 30 tablet; Refill: 5  5. PMS (premenstrual syndrome) Rx: - PARoxetine (PAXIL) 20 MG tablet; Take 1 tablet (20 mg total) by mouth daily.  Dispense: 30 tablet; Refill: 11  6. Pelvic pain Rx: - US PELVIC COMPLETE WITH TRANSVAGINAL; Future    Plan:    Education reviewed: calcium supplements, depression evaluation, low fat, low cholesterol diet, safe sex/STD prevention, self breast exams and weight bearing exercise. Contraception: OCP (estrogen/progesterone). Follow up in: 1 year.   Meds ordered this encounter  Medications  . metroNIDAZOLE (FLAGYL) 500 MG tablet    Sig: Take 1 tablet (500 mg total) by mouth 2 (two) times daily.    Dispense:  14 tablet    Refill:  2  . ibuprofen (ADVIL,MOTRIN) 800 MG tablet    Sig: Take 1 tablet (800 mg total) by mouth every 8 (eight) hours as needed.    Dispense:  30 tablet    Refill:  5  . PARoxetine (PAXIL) 20 MG tablet    Sig: Take 1 tablet (20 mg total) by mouth daily.    Dispense:  30 tablet    Refill:  11   Orders Placed This Encounter  Procedures  . US PELVIC COMPLETE WITH TRANSVAGINAL  Epic order Wt 243 / no needs Ins - bcbs Bl/anitra @ [facility]    Standing Status:   Future    Standing Expiration Date:   03/28/2019    Order Specific Question:   Reason for Exam (SYMPTOM  OR DIAGNOSIS REQUIRED)    Answer:   Pelvic pain    Order Specific Question:   Preferred imaging location?    Answer:   [facility]    Brock BadHARLES A HARPER MD 01-25-2018

## 2018-01-26 LAB — CYTOLOGY - PAP
Diagnosis: NEGATIVE
HPV: NOT DETECTED

## 2018-01-26 LAB — CERVICOVAGINAL ANCILLARY ONLY
BACTERIAL VAGINITIS: POSITIVE — AB
CANDIDA VAGINITIS: NEGATIVE
CHLAMYDIA, DNA PROBE: NEGATIVE
Neisseria Gonorrhea: NEGATIVE
Trichomonas: NEGATIVE

## 2018-01-27 ENCOUNTER — Other Ambulatory Visit: Payer: Self-pay | Admitting: Obstetrics

## 2018-01-28 ENCOUNTER — Ambulatory Visit
Admission: RE | Admit: 2018-01-28 | Discharge: 2018-01-28 | Disposition: A | Discharge status: Home / Self Care | Payer: Medicaid Other | Source: Ambulatory Visit | Attending: Obstetrics | Admitting: Obstetrics

## 2018-01-28 DIAGNOSIS — R102 Pelvic and perineal pain: Secondary | ICD-10-CM

## 2018-11-03 NOTE — L&D Delivery Note (Signed)
Delivery Note Labor onset: 09/15/2019  Labor Onset Time: 0100 Complete dilation at 1:26 AM  Onset of pushing at 0127 FHR second stage Cat 1 Analgesia/Anesthesia intrapartum: None  Precipitous active stage and birht. CNM called to bedside, strong maternal urge to push w/ vertex @ +3 station. Delivery of a viable female w/ the force of one contraction at 0129. Fetal head delivered in OA position and restituted to LOA. Nuchal cord N/A. Infant placed on maternal abd, dried, and tactile stim. Lusty cry noted w/ good tone Cord double clamped and cut by Colin Rhein, father.  Cord blood sample collected Arterial cord blood sample N/A.  Placenta delivered Schultz side, intact, with 3 VC, and large clot on Duncan side.  Placenta to pathology for pre-ecl and possible abruption. Uterine tone firm w/ several clots, bleeding moderate. Cytotec 800 mcg rectally  Bilat labial splaysidentified.  Anesthesia: 1% Lidocaine Repair: 4-0 Vicryl SH QBL (mL): 765 Complications: None APGAR: APGAR (1 MIN): 9   APGAR (5 MINS): 9   APGAR (10 MINS):   Mom to [facility].  Baby to Couplet care / Skin to Skin   PP MgSO4 for pre-eclampsia  Mother plans PP BTL, consent signed.   Arrie Eastern MSN, CNM 09/15/2019, 3:15 AM

## 2018-11-04 ENCOUNTER — Telehealth: Payer: Self-pay

## 2018-11-04 NOTE — Telephone Encounter (Signed)
Pt called and left message stating that she is having irregular bleeding and cramping. Pt would like to know what can be done. Returned patient's call, no answer, left message on vm for her to return call to the office

## 2019-01-02 IMAGING — US US OB COMP LESS 14 WK
1 series · 15 of 28 positions shown · non-contrast
Comparison: Pelvic ultrasound 12/09/2013.

CLINICAL DATA: Pregnant patient with sharp right lower quadrant
abdominal pain.

EXAM:
OBSTETRIC <14 WK US AND TRANSVAGINAL OB US
TECHNIQUE: Both transabdominal and transvaginal ultrasound examinations were
performed for complete evaluation of the gestation as well as the
maternal uterus, adnexal regions, and pelvic cul-de-sac.
Transvaginal technique was performed to assess early pregnancy.

[Series 1: us ob comp less 14 wk · 15 of 117 slices shown]
[im 1/117]
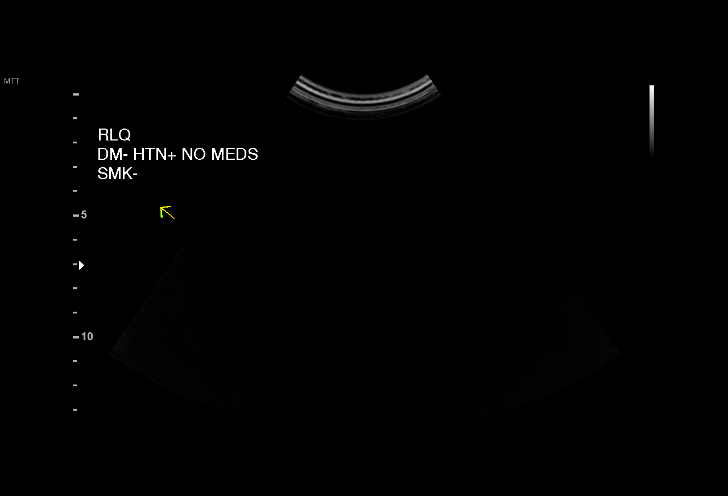
[im 9/117]
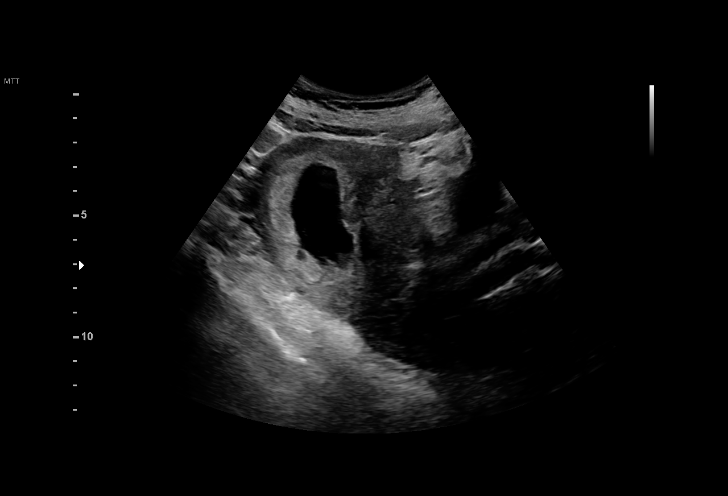
[im 18/117]
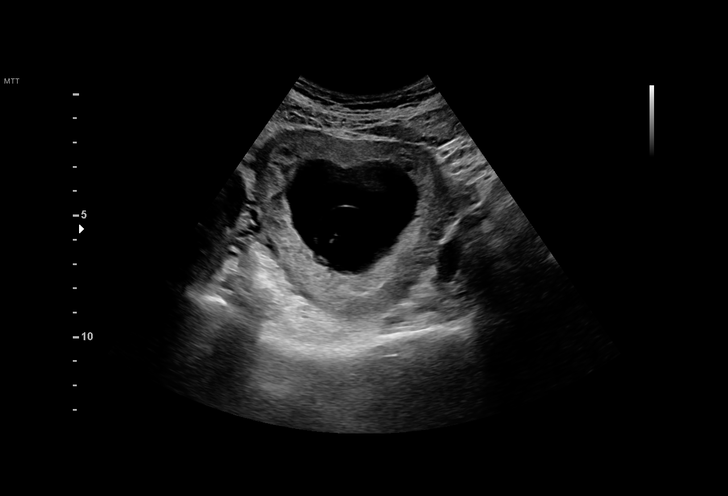
[im 26/117]
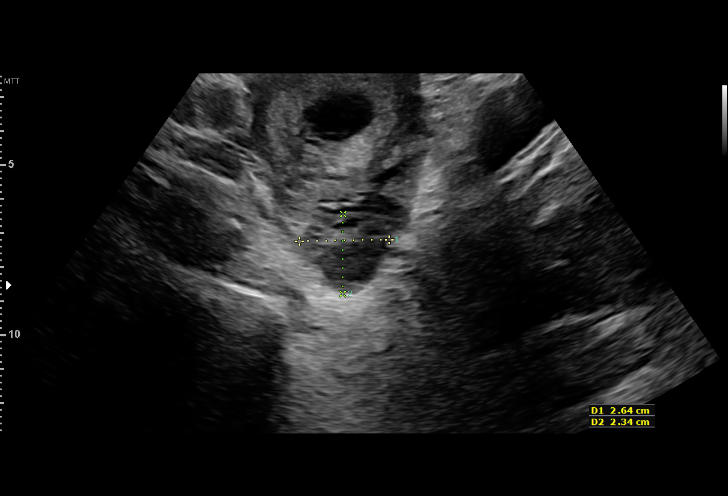
[im 35/117]
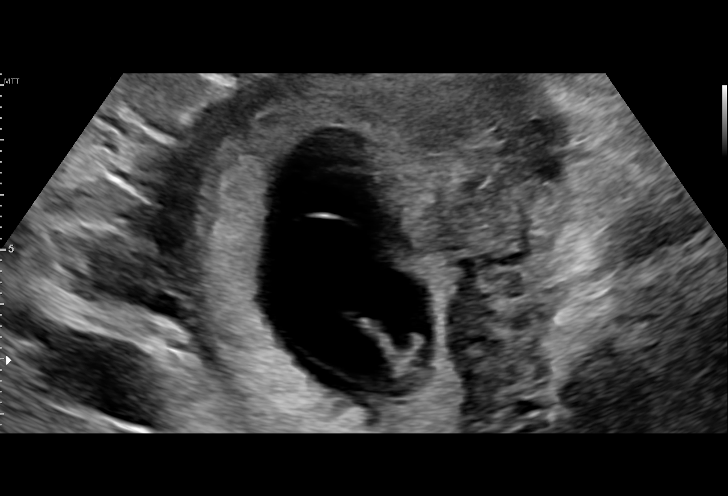
[im 43/117]
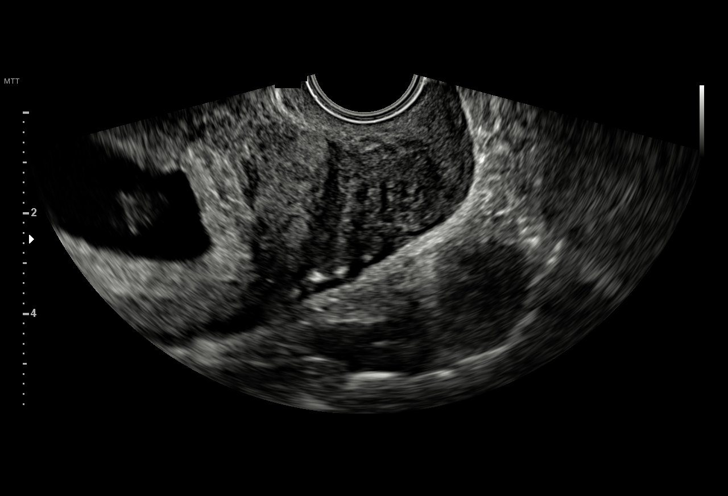
[im 52/117]
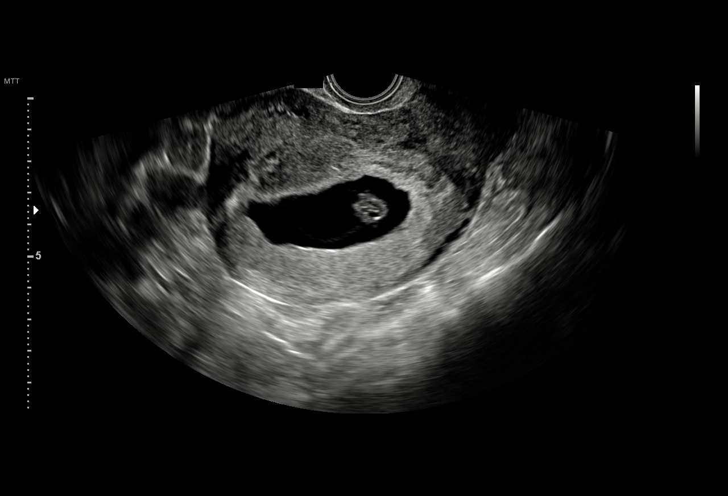
[im 61/117]
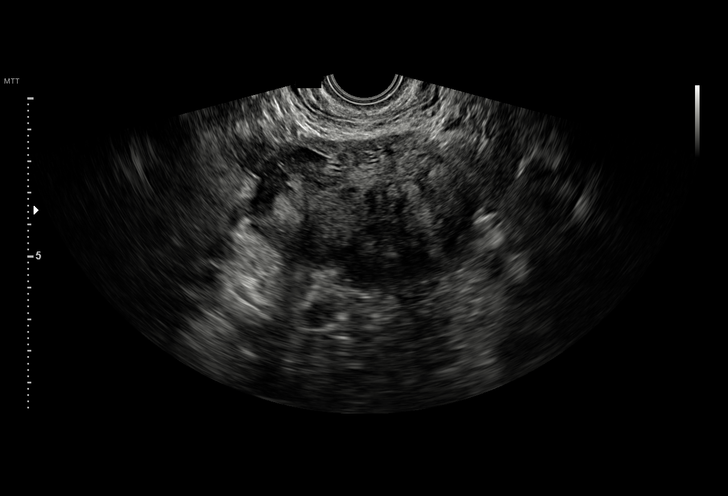
[im 65/117]
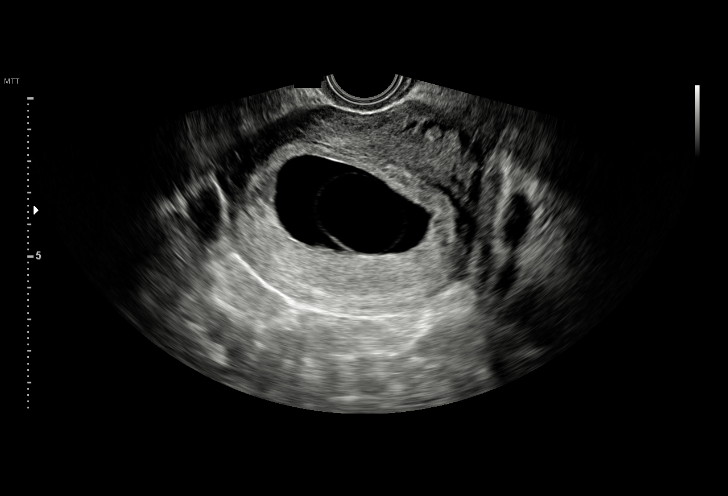
[im 74/117]
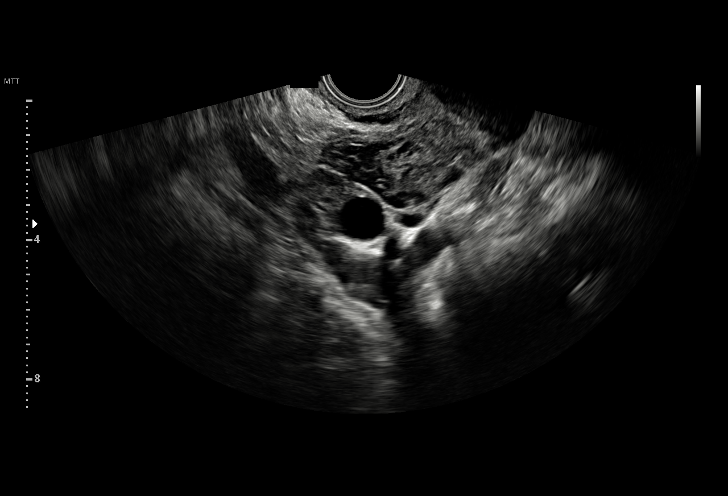
[im 82/117]
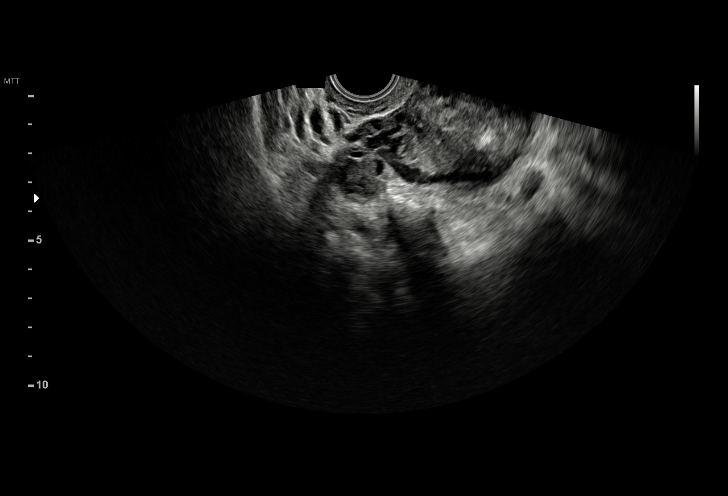
[im 91/117]
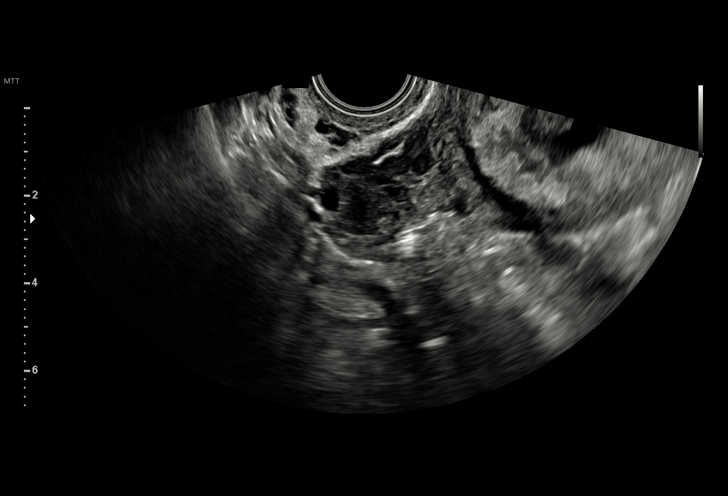
[im 99/117]
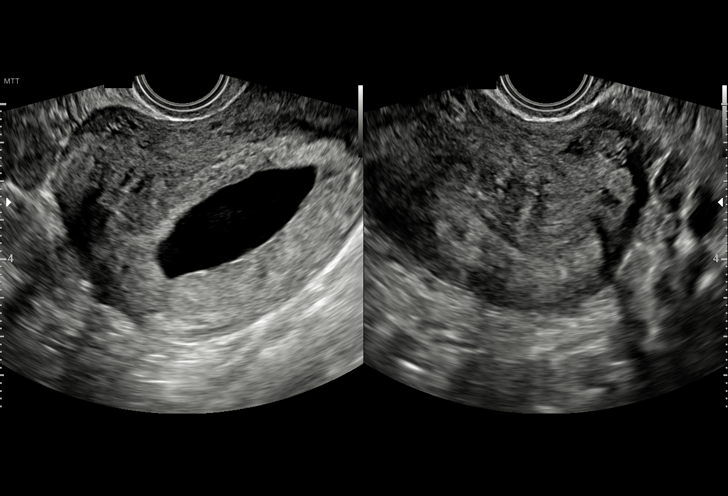
[im 108/117]
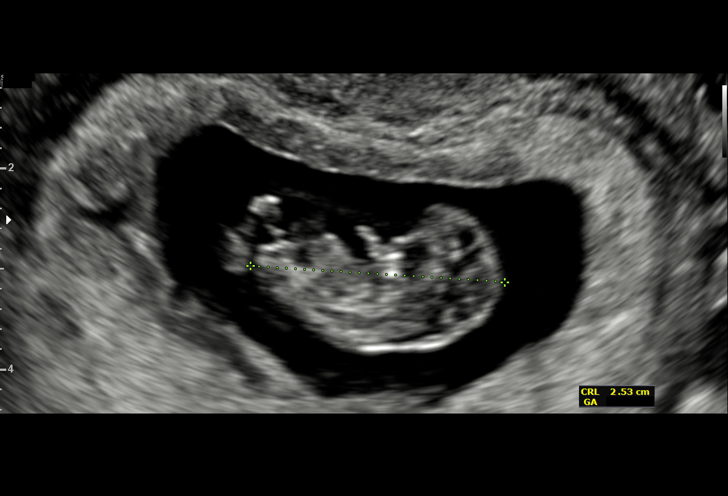
[im 117/117]
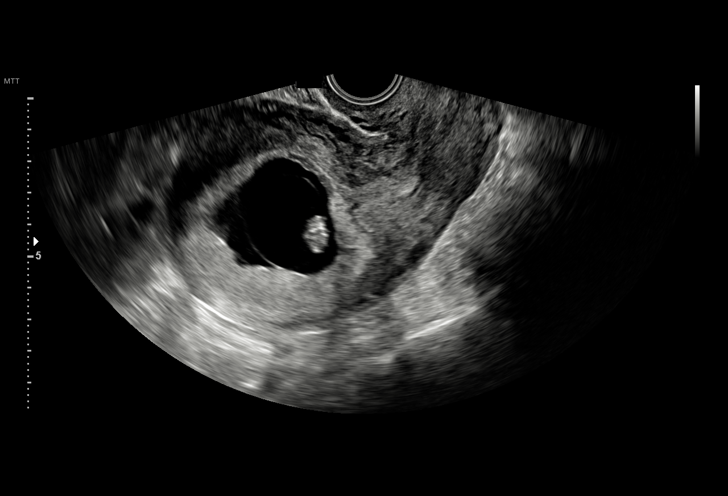

[15 of 28 positions shown; findings below may reference images not displayed]

FINDINGS: Intrauterine gestational sac: Single

Yolk sac:  Visualized.

Embryo:  Visualized.

Cardiac Activity: Visualized.

Heart Rate: 166  bpm

CRL:  24.8  mm   9 w   1 d                  US EDC: 07/15/2017

Subchorionic hemorrhage:  None visualized.

Maternal uterus/adnexae: Small fibroid within the anterior uterine
fundus measuring 1.5 x 1.2 x 1.3 cm. Probable corpus luteum within
the right ovary. Normal left ovary. Trace free fluid in the pelvis.
IMPRESSION: Single live intrauterine gestation.  No subchorionic hemorrhage.

## 2019-02-15 ENCOUNTER — Inpatient Hospital Stay (HOSPITAL_COMMUNITY)
Admission: AD | Admit: 2019-02-15 | Discharge: 2019-02-15 | Disposition: A | Discharge status: Home / Self Care | Payer: Self-pay | Attending: Obstetrics and Gynecology | Admitting: Obstetrics and Gynecology

## 2019-02-15 ENCOUNTER — Other Ambulatory Visit: Payer: Self-pay

## 2019-02-15 ENCOUNTER — Encounter (HOSPITAL_COMMUNITY): Payer: Self-pay | Admitting: *Deleted

## 2019-02-15 ENCOUNTER — Inpatient Hospital Stay (HOSPITAL_COMMUNITY): Payer: Self-pay

## 2019-02-15 DIAGNOSIS — O161 Unspecified maternal hypertension, first trimester: Secondary | ICD-10-CM | POA: Insufficient documentation

## 2019-02-15 DIAGNOSIS — O26891 Other specified pregnancy related conditions, first trimester: Secondary | ICD-10-CM

## 2019-02-15 DIAGNOSIS — O23591 Infection of other part of genital tract in pregnancy, first trimester: Secondary | ICD-10-CM

## 2019-02-15 DIAGNOSIS — N760 Acute vaginitis: Secondary | ICD-10-CM

## 2019-02-15 DIAGNOSIS — O219 Vomiting of pregnancy, unspecified: Secondary | ICD-10-CM

## 2019-02-15 DIAGNOSIS — Z3A01 Less than 8 weeks gestation of pregnancy: Secondary | ICD-10-CM

## 2019-02-15 DIAGNOSIS — O26899 Other specified pregnancy related conditions, unspecified trimester: Secondary | ICD-10-CM

## 2019-02-15 DIAGNOSIS — O99511 Diseases of the respiratory system complicating pregnancy, first trimester: Secondary | ICD-10-CM | POA: Insufficient documentation

## 2019-02-15 DIAGNOSIS — R102 Pelvic and perineal pain: Secondary | ICD-10-CM

## 2019-02-15 DIAGNOSIS — Z79899 Other long term (current) drug therapy: Secondary | ICD-10-CM | POA: Insufficient documentation

## 2019-02-15 DIAGNOSIS — B9689 Other specified bacterial agents as the cause of diseases classified elsewhere: Secondary | ICD-10-CM

## 2019-02-15 DIAGNOSIS — J45909 Unspecified asthma, uncomplicated: Secondary | ICD-10-CM | POA: Insufficient documentation

## 2019-02-15 LAB — COMPREHENSIVE METABOLIC PANEL
ALT: 14 U/L (ref 0–44)
AST: 15 U/L (ref 15–41)
Albumin: 3.3 g/dL — ABNORMAL LOW (ref 3.5–5.0)
Alkaline Phosphatase: 57 U/L (ref 38–126)
Anion gap: 8 (ref 5–15)
BUN: <5 mg/dL — ABNORMAL LOW (ref 6–20)
CO2: 24 mmol/L (ref 22–32)
Calcium: 9 mg/dL (ref 8.9–10.3)
Chloride: 102 mmol/L (ref 98–111)
Creatinine, Ser: 0.79 mg/dL (ref 0.44–1.00)
GFR calc Af Amer: >60 mL/min (ref >60)
GFR calc non Af Amer: >60 mL/min (ref >60)
Glucose, Bld: 81 mg/dL (ref 70–99)
Potassium: 3.6 mmol/L (ref 3.5–5.1)
Sodium: 134 mmol/L — ABNORMAL LOW (ref 135–145)
Total Bilirubin: 0.6 mg/dL (ref 0.3–1.2)
Total Protein: 6.5 g/dL (ref 6.5–8.1)

## 2019-02-15 LAB — CBC
HCT: 37.3 % (ref 36.0–46.0)
Hemoglobin: 12.5 g/dL (ref 12.0–15.0)
MCH: 29.9 pg (ref 26.0–34.0)
MCHC: 33.5 g/dL (ref 30.0–36.0)
MCV: 89.2 fL (ref 80.0–100.0)
Platelets: 225 10*3/uL (ref 150–400)
RBC: 4.18 MIL/uL (ref 3.87–5.11)
RDW: 12.3 % (ref 11.5–15.5)
WBC: 7.6 10*3/uL (ref 4.0–10.5)
nRBC: 0 % (ref 0.0–0.2)

## 2019-02-15 LAB — URINALYSIS, ROUTINE W REFLEX MICROSCOPIC
Bilirubin Urine: NEGATIVE
Glucose, UA: NEGATIVE mg/dL
Hgb urine dipstick: NEGATIVE
Ketones, ur: 5 mg/dL — AB
Nitrite: NEGATIVE
Protein, ur: 30 mg/dL — AB
Specific Gravity, Urine: 1.021 (ref 1.005–1.030)
pH: 7 (ref 5.0–8.0)

## 2019-02-15 LAB — WET PREP, GENITAL
Sperm: NONE SEEN
Trich, Wet Prep: NONE SEEN
Yeast Wet Prep HPF POC: NONE SEEN

## 2019-02-15 LAB — HCG, QUANTITATIVE, PREGNANCY: hCG, Beta Chain, Quant, S: 111836 m[IU]/mL — ABNORMAL HIGH (ref <5)

## 2019-02-15 LAB — POCT PREGNANCY, URINE: Preg Test, Ur: POSITIVE — AB

## 2019-02-15 MED ORDER — LACTATED RINGERS IV BOLUS
1000.0000 mL | Freq: Once | INTRAVENOUS | Status: AC
  Administered 2019-02-15: 1000 mL via INTRAVENOUS

## 2019-02-15 MED ORDER — METRONIDAZOLE 500 MG PO TABS: 500.0000 mg | ORAL_TABLET | Freq: Two times a day (BID) | ORAL | 0 refills | Status: AC

## 2019-02-15 MED ORDER — PROMETHAZINE HCL 12.5 MG PO TABS: 12.5000 mg | ORAL_TABLET | Freq: Three times a day (TID) | ORAL | 0 refills | Status: DC | PRN

## 2019-02-15 MED ORDER — PROMETHAZINE HCL 25 MG/ML IJ SOLN
25.0000 mg | Freq: Once | INTRAMUSCULAR | Status: AC
  Administered 2019-02-15: 25 mg via INTRAVENOUS
  Filled 2019-02-15: qty 1

## 2019-02-15 NOTE — MAU Note (Signed)
Can't keep anything down, stomach is cramping really bad.  Started yesterday. +HPT yesterday.

## 2019-02-15 NOTE — Discharge Instructions (Signed)
Abdominal Pain During Pregnancy  Abdominal pain is common during pregnancy, and has many possible causes. Some causes are more serious than others, and sometimes the cause is not known. Abdominal pain can be a sign that labor is starting. It can also be caused by normal growth and stretching of muscles and ligaments during pregnancy. Always tell your health care provider if you have any abdominal pain. Follow these instructions at home:  Do not have sex or put anything in your vagina until your pain goes away completely.  Get plenty of rest until your pain improves.  Drink enough fluid to keep your urine pale yellow.  Take over-the-counter and prescription medicines only as told by your health care provider.  Keep all follow-up visits as told by your health care provider. This is important. Contact a health care provider if:  Your pain continues or gets worse after resting.  You have lower abdominal pain that: ? Comes and goes at regular intervals. ? Spreads to your back. ? Is similar to menstrual cramps.  You have pain or burning when you urinate. Get help right away if:  You have a fever or chills.  You have vaginal bleeding.  You are leaking fluid from your vagina.  You are passing tissue from your vagina.  You have vomiting or diarrhea that lasts for more than 24 hours.  Your baby is moving less than usual.  You feel very weak or faint.  You have shortness of breath.  You develop severe pain in your upper abdomen. Summary  Abdominal pain is common during pregnancy, and has many possible causes.  If you experience abdominal pain during pregnancy, tell your health care provider right away.  Follow your health care provider's home care instructions and keep all follow-up visits as directed. This information is not intended to replace advice given to you by your health care provider. Make sure you discuss any questions you have with your health care  provider. Document Released: 10/20/2005 Document Revised: 01/22/2017 Document Reviewed: 01/22/2017 Elsevier Interactive Patient Education  2019 [employer] Inc[employer]  Bacterial Vaginosis  Bacterial vaginosis is a vaginal infection that occurs when the normal balance of bacteria in the vagina is disrupted. It results from an overgrowth of certain bacteria. This is the most common vaginal infection among women ages 64-44. Because bacterial vaginosis increases your risk for STIs (sexually transmitted infections), getting treated can help reduce your risk for chlamydia, gonorrhea, herpes, and HIV (human immunodeficiency virus). Treatment is also important for preventing complications in pregnant women, because this condition can cause an early (premature) delivery. What are the causes? This condition is caused by an increase in harmful bacteria that are normally present in small amounts in the vagina. However, the reason that the condition develops is not fully understood. What increases the risk? The following factors may make you more likely to develop this condition:  Having a new sexual partner or multiple sexual partners.  Having unprotected sex.  Douching.  Having an intrauterine device (IUD).  Smoking.  Drug and alcohol abuse.  Taking certain antibiotic medicines.  Being pregnant. You cannot get bacterial vaginosis from toilet seats, bedding, swimming pools, or contact with objects around you. What are the signs or symptoms? Symptoms of this condition include:  Grey or white vaginal discharge. The discharge can also be watery or foamy.  A fish-like odor with discharge, especially after sexual intercourse or during menstruation.  Itching in and around the vagina.  Burning or pain with urination. Some women  with bacterial vaginosis have no signs or symptoms. °How is this diagnosed? °This condition is diagnosed based on: °· Your medical history. °· A physical exam of the  vagina. °· Testing a sample of vaginal fluid under a microscope to look for a large amount of bad bacteria or abnormal cells. Your health care provider may use a cotton swab or a small wooden spatula to collect the sample. °How is this treated? °This condition is treated with antibiotics. These may be given as a pill, a vaginal cream, or a medicine that is put into the vagina (suppository). If the condition comes back after treatment, a second round of antibiotics may be needed. °Follow these instructions at home: °Medicines °· Take over-the-counter and prescription medicines only as told by your health care provider. °· Take or use your antibiotic as told by your health care provider. Do not stop taking or using the antibiotic even if you start to feel better. °General instructions °· If you have a female sexual partner, tell her that you have a vaginal infection. She should see her health care provider and be treated if she has symptoms. If you have a female sexual partner, he does not need treatment. °· During treatment: °? Avoid sexual activity until you finish treatment. °? Do not douche. °? Avoid alcohol as directed by your health care provider. °? Avoid breastfeeding as directed by your health care provider. °· Drink enough water and fluids to keep your urine clear or pale yellow. °· Keep the area around your vagina and rectum clean. °? Wash the area daily with warm water. °? Wipe yourself from front to back after using the toilet. °· Keep all follow-up visits as told by your health care provider. This is important. °How is this prevented? °· Do not douche. °· Wash the outside of your vagina with warm water only. °· Use protection when having sex. This includes latex condoms and dental dams. °· Limit how many sexual partners you have. To help prevent bacterial vaginosis, it is best to have sex with just one partner (monogamous). °· Make sure you and your sexual partner are tested for STIs. °· Wear cotton or  cotton-lined underwear. °· Avoid wearing tight pants and pantyhose, especially during summer. °· Limit the amount of alcohol that you drink. °· Do not use any products that contain nicotine or tobacco, such as cigarettes and e-cigarettes. If you need help quitting, ask your health care provider. °· Do not use illegal drugs. °Where to find more information °· Centers for Disease Control and Prevention: www.cdc.gov/std °· American Sexual Health Association (ASHA): www.ashastd.org °· U.S. Department of Health and Human Services, Office on Women's Health: www.womenshealth.gov/ or https://www.womenshealth.gov/a-z-topics/bacterial-vaginosis °Contact a health care provider if: °· Your symptoms do not improve, even after treatment. °· You have more discharge or pain when urinating. °· You have a fever. °· You have pain in your abdomen. °· You have pain during sex. °· You have vaginal bleeding between periods. °Summary °· Bacterial vaginosis is a vaginal infection that occurs when the normal balance of bacteria in the vagina is disrupted. °· Because bacterial vaginosis increases your risk for STIs (sexually transmitted infections), getting treated can help reduce your risk for chlamydia, gonorrhea, herpes, and HIV (human immunodeficiency virus). Treatment is also important for preventing complications in pregnant women, because the condition can cause an early (premature) delivery. °· This condition is treated with antibiotic medicines. These may be given as a pill, a vaginal cream, or a medicine that   is put into the vagina (suppository). This information is not intended to replace advice given to you by your health care provider. Make sure you discuss any questions you have with your health care provider. Document Released: 10/20/2005 Document Revised: 02/23/2017 Document Reviewed: 07/05/2016 Elsevier Interactive Patient Education  2019 Elsevier Inc.  Morning Sickness  Morning sickness is when a woman feels nauseous  during pregnancy. This nauseous feeling may or may not come with vomiting. It often occurs in the morning, but it can be a problem at any time of day. Morning sickness is most common during the first trimester. In some cases, it may continue throughout pregnancy. Although morning sickness is unpleasant, it is usually harmless unless the woman develops severe and continual vomiting (hyperemesis gravidarum), a condition that requires more intense treatment. What are the causes? The exact cause of this condition is not known, but it seems to be related to normal hormonal changes that occur in pregnancy. What increases the risk? You are more likely to develop this condition if:  You experienced nausea or vomiting before your pregnancy.  You had morning sickness during a previous pregnancy.  You are pregnant with more than one baby, such as twins. What are the signs or symptoms? Symptoms of this condition include:  Nausea.  Vomiting. How is this diagnosed? This condition is usually diagnosed based on your signs and symptoms. How is this treated? In many cases, treatment is not needed for this condition. Making some changes to what you eat may help to control symptoms. Your health care provider may also prescribe or recommend:  Vitamin B6 supplements.  Anti-nausea medicines.  Ginger. Follow these instructions at home: Medicines  Take over-the-counter and prescription medicines only as told by your health care provider. Do not use any prescription, over-the-counter, or herbal medicines for morning sickness without first talking with your health care provider.  Taking multivitamins before getting pregnant can prevent or decrease the severity of morning sickness in most women. Eating and drinking  Eat a piece of dry toast or crackers before getting out of bed in the morning.  Eat 5 or 6 small meals a day.  Eat dry and bland foods, such as rice or a baked potato. Foods that are high in  carbohydrates are often helpful.  Avoid greasy, fatty, and spicy foods.  Have someone cook for you if the smell of any food causes nausea and vomiting.  If you feel nauseous after taking prenatal vitamins, take the vitamins at night or with a snack.  Snack on protein foods between meals if you are hungry. Nuts, yogurt, and cheese are good options.  Drink fluids throughout the day.  Try ginger ale made with real ginger, ginger tea made from fresh grated ginger, or ginger candies. General instructions  Do not use any products that contain nicotine or tobacco, such as cigarettes and e-cigarettes. If you need help quitting, ask your health care provider.  Get an air purifier to keep the air in your house free of odors.  Get plenty of fresh air.  Try to avoid odors that trigger your nausea.  Consider trying these methods to help relieve symptoms: ? Wearing an acupressure wristband. These wristbands are often worn for seasickness. ? Acupuncture. Contact a health care provider if:  Your home remedies are not working and you need medicine.  You feel dizzy or light-headed.  You are losing weight. Get help right away if:  You have persistent and uncontrolled nausea and vomiting.  You faint.  You have severe pain in your abdomen. Summary  Morning sickness is when a woman feels nauseous during pregnancy. This nauseous feeling may or may not come with vomiting.  Morning sickness is most common during the first trimester.  It often occurs in the morning, but it can be a problem at any time of day.  In many cases, treatment is not needed for this condition. Making some changes to what you eat may help to control symptoms. This information is not intended to replace advice given to you by your health care provider. Make sure you discuss any questions you have with your health care provider. Document Released: 12/11/2006 Document Revised: 11/22/2016 Document Reviewed:  11/22/2016 Elsevier Interactive Patient Education  2019 [employer].

## 2019-02-15 NOTE — MAU Provider Note (Signed)
History     CSN: [id number]  Arrival date and time: 02/15/19 7829   First Provider Initiated Contact with Patient 02/15/19 1013      Chief Complaint  Patient presents with  . Abdominal Pain  . Possible Pregnancy  . Emesis   [name] is a 33 y.o. [id number] at [redacted]w[redacted]d who presents to MAU for nausea/vomiting/cramping. Pt reports last food/drink was 48hrs ago. Pt reports she is unable to eat or drink anything without vomiting.  Onset: yesterday Location: lower abdomen/pelvis Duration: 1day Character: "menstrual-like cramping that is real severe" Aggravating/Associated: none/none Relieving: sleep Treatment: Tylenol (didn't work), crackers/gatorade (vomiting these) Severity: 9/10  Pt reports +vaginal odor "like fish" with a white vaginal discharge. Pt denies VB, vaginal itching. Pt denies constipation, diarrhea, or urinary problems. Pt denies fever, chills, fatigue, sweating or changes in appetite. Pt denies SOB or chest pain. Pt denies dizziness, HA, light-headedness, weakness.   Problems this pregnancy include: HTN. Allergies? NKDA Blood Type: B Positive Current medications/supplements? Albuterol, zyrtec Prenatal care provider? [facility], no appt scheduled yet.   OB History    Gravida  5   Para  3   Term  3   Preterm  0   AB  1   Living  2     SAB  1   TAB  0   Ectopic  0   Multiple  0   Live Births  2           Past Medical History:  Diagnosis Date  . Asthma   . Chlamydia 2005  . Environmental allergies   . Hypertension    gestational hypertension    Past Surgical History:  Procedure Laterality Date  . NO PAST SURGERIES      Family History  Problem Relation Age of Onset  . Diabetes Father   . Hypertension Father   . Kidney failure Father   . Anesthesia problems Neg Hx     Social History   Tobacco Use  . Smoking status: Never Smoker  . Smokeless tobacco: Never Used  Substance Use Topics  . Alcohol use: Not Currently     Alcohol/week: 3.0 standard drinks    Types: 3 Standard drinks or equivalent per week    Comment: occasionally  . Drug use: No    Allergies: No Known Allergies  Medications Prior to Admission  Medication Sig Dispense Refill Last Dose  . norgestimate-ethinyl estradiol (ORTHO-CYCLEN,SPRINTEC,PREVIFEM) 0.25-35 MG-MCG tablet Take 1 tablet by mouth daily. 1 Package 11 Past Week at Unknown time  . acetaminophen (TYLENOL) 500 MG tablet Take 1,000 mg by mouth every 8 (eight) hours as needed for mild pain.   Taking  . albuterol (PROVENTIL HFA;VENTOLIN HFA) 108 (90 BASE) MCG/ACT inhaler Inhale 1-2 puffs into the lungs every 6 (six) hours as needed for wheezing or shortness of breath. 1 Inhaler 0 More than a month at Unknown time  . ibuprofen (ADVIL,MOTRIN) 600 MG tablet Take 1 tablet (600 mg total) by mouth every 6 (six) hours. 30 tablet 0 Taking  . ibuprofen (ADVIL,MOTRIN) 800 MG tablet Take 1 tablet (800 mg total) by mouth every 8 (eight) hours as needed. 30 tablet 5   . metroNIDAZOLE (FLAGYL) 500 MG tablet Take 1 tablet (500 mg total) by mouth 2 (two) times daily. 14 tablet 2   . PARoxetine (PAXIL) 20 MG tablet Take 1 tablet (20 mg total) by mouth daily. 30 tablet 11   . Prenatal Vit-Fe Phos-FA-Omega (VITAFOL GUMMIES) 3.33-0.333-34.8 MG CHEW Chew  3 tablets by mouth daily before breakfast. 90 tablet 11 Taking    Review of Systems  Constitutional: Positive for appetite change. Negative for chills, diaphoresis, fatigue and fever.  Respiratory: Negative for shortness of breath.   Cardiovascular: Negative for chest pain.  Gastrointestinal: Positive for nausea and vomiting. Negative for abdominal pain, constipation and diarrhea.  Genitourinary: Positive for pelvic pain and vaginal discharge. Negative for dysuria, flank pain, frequency, urgency and vaginal bleeding.  Musculoskeletal: Negative for back pain.  Neurological: Positive for light-headedness. Negative for dizziness, weakness and headaches.    Physical Exam   Blood pressure 128/63, pulse (!) 53, temperature 98.2 F (36.8 C), temperature source Oral, resp. rate 18, height 5\' 5"  (1.651 m), weight 100 kg, last menstrual period 01/15/2019, SpO2 100 %, not currently breastfeeding.   Patient Vitals for the past 24 hrs:  BP Temp Temp src Pulse Resp SpO2 Height Weight  02/15/19 1233 128/63 98.2 F (36.8 C) Oral (!) 53 18 100 % - -  02/15/19 0925 132/87 98.1 F (36.7 C) Oral (!) 53 17 100 % 5\' 5"  (1.651 m) 100 kg   Physical Exam  Constitutional: She is oriented to person, place, and time. She appears well-developed and well-nourished. No distress.  HENT:  Head: Normocephalic and atraumatic.  Respiratory: Effort normal.  GI: Soft. She exhibits no distension and no mass. There is abdominal tenderness in the right lower quadrant and left lower quadrant. There is no rebound and no guarding.  Genitourinary: There is no rash, tenderness or lesion on the right labia. There is no rash, tenderness or lesion on the left labia. Uterus is not enlarged and not tender. Cervix exhibits motion tenderness. Cervix exhibits no discharge and no friability. Right adnexum displays tenderness. Right adnexum displays no mass and no fullness. Left adnexum displays tenderness. Left adnexum displays no mass and no fullness.    No vaginal discharge, tenderness or bleeding.  No tenderness or bleeding in the vagina.  Neurological: She is alert and oriented to person, place, and time.  Skin: Skin is warm and dry. She is not diaphoretic.  Psychiatric: She has a normal mood and affect. Her behavior is normal.   Results for orders placed or performed during the hospital encounter of 02/15/19 (from the past 24 hour(s))  Pregnancy, urine POC     Status: Abnormal   Collection Time: 02/15/19  9:39 AM  Result Value Ref Range   Preg Test, Ur POSITIVE (A) NEGATIVE  Urinalysis, Routine w reflex microscopic     Status: Abnormal   Collection Time: 02/15/19  9:42 AM  Result  Value Ref Range   Color, Urine YELLOW YELLOW   APPearance HAZY (A) CLEAR   Specific Gravity, Urine 1.021 1.005 - 1.030   pH 7.0 5.0 - 8.0   Glucose, UA NEGATIVE NEGATIVE mg/dL   Hgb urine dipstick NEGATIVE NEGATIVE   Bilirubin Urine NEGATIVE NEGATIVE   Ketones, ur 5 (A) NEGATIVE mg/dL   Protein, ur 30 (A) NEGATIVE mg/dL   Nitrite NEGATIVE NEGATIVE   Leukocytes,Ua TRACE (A) NEGATIVE   RBC / HPF 0-5 0 - 5 RBC/hpf   WBC, UA 0-5 0 - 5 WBC/hpf   Bacteria, UA RARE (A) NONE SEEN   Squamous Epithelial / LPF 21-50 0 - 5   Mucus PRESENT    Hyaline Casts, UA PRESENT   Wet prep, genital     Status: Abnormal   Collection Time: 02/15/19 10:31 AM  Result Value Ref Range   Yeast Wet Prep HPF  POC NONE SEEN NONE SEEN   Trich, Wet Prep NONE SEEN NONE SEEN   Clue Cells Wet Prep HPF POC PRESENT (A) NONE SEEN   WBC, Wet Prep HPF POC MODERATE (A) NONE SEEN   Sperm NONE SEEN   CBC     Status: None   Collection Time: 02/15/19 10:48 AM  Result Value Ref Range   WBC 7.6 4.0 - 10.5 K/uL   RBC 4.18 3.87 - 5.11 MIL/uL   Hemoglobin 12.5 12.0 - 15.0 g/dL   HCT 16.137.3 09.636.0 - 04.546.0 %   MCV 89.2 80.0 - 100.0 fL   MCH 29.9 26.0 - 34.0 pg   MCHC 33.5 30.0 - 36.0 g/dL   RDW 40.912.3 81.111.5 - 91.415.5 %   Platelets 225 150 - 400 K/uL   nRBC 0.0 0.0 - 0.2 %  hCG, quantitative, pregnancy     Status: Abnormal   Collection Time: 02/15/19 10:48 AM  Result Value Ref Range   hCG, Beta Chain, Quant, S 111,836 (H) <5 mIU/mL  Comprehensive metabolic panel     Status: Abnormal   Collection Time: 02/15/19 10:48 AM  Result Value Ref Range   Sodium 134 (L) 135 - 145 mmol/L   Potassium 3.6 3.5 - 5.1 mmol/L   Chloride 102 98 - 111 mmol/L   CO2 24 22 - 32 mmol/L   Glucose, Bld 81 70 - 99 mg/dL   BUN <5 (L) 6 - 20 mg/dL   Creatinine, Ser 7.820.79 0.44 - 1.00 mg/dL   Calcium 9.0 8.9 - 95.610.3 mg/dL   Total Protein 6.5 6.5 - 8.1 g/dL   Albumin 3.3 (L) 3.5 - 5.0 g/dL   AST 15 15 - 41 U/L   ALT 14 0 - 44 U/L   Alkaline Phosphatase 57 38 -  126 U/L   Total Bilirubin 0.6 0.3 - 1.2 mg/dL   GFR calc non Af Amer >60 >60 mL/min   GFR calc Af Amer >60 >60 mL/min   Anion gap 8 5 - 15   [place] Ob Less Than 14 Weeks With Ob Transvaginal  Result Date: 02/15/2019 CLINICAL DATA:  Abdominal cramping EXAM: OBSTETRIC <14 WK US AND TRANSVAGINAL OB US TECHNIQUE: Both transabdominal and transvaginal ultrasound examinations were performed for complete evaluation of the gestation as well as the maternal uterus, adnexal regions, and pelvic cul-de-sac. Transvaginal technique was performed to assess early pregnancy. COMPARISON:  None. FINDINGS: Intrauterine gestational sac: Visualized Yolk sac:  Usual lysed Embryo:  Visualized Cardiac Activity: Visualized Heart Rate: 127 bpm CRL:  9 mm   6 w   5 d                  US EDC: October 06, 2019 Subchorionic hemorrhage:  None visualized. Maternal uterus/adnexae: Cervical os is closed. Right ovary measures 2.8 x 1.82.0 cm. Left ovary measures 3.2 x 1.9 x 2.1 cm. Small corpus luteum in right ovary, a physiologic finding. No free pelvic fluid. IMPRESSION: Single live intrauterine gestation with estimated gestational age of 7-weeks. Study otherwise unremarkable. Electronically Signed   By: Bretta BangWilliam  Woodruff III M.D.   On: 02/15/2019 11:33    MAU Course  Procedures  MDM -r/o ectopic w/+CMT (without mucopurulent discharge)       -CBC: WNL (H/H 12.5/37.3)       -US: single IUP c/w 3964w5d GA       -hCG: 213,086111,836       -WetPrep: +ClueCells (will tx for BV based on reported sx), +WBCs       -  GC/CT collected       -Pt reports lower abdominal/pelvic pain decreased to 4/10 after LR -N/V in pregnancy       -UA: hazy/5ketones/30PRO/trace leuks/rare bacteria - sending for urine cx d/t sx       -1L LR given w/ phenergan        -CMP: WNL for pregnancy       -PO Challenge: successful -pt discharged to home in stable condition with someone to drive her        Assessment and Plan   1. Nausea and vomiting in pregnancy   2.  Pelvic pain in pregnancy   3. [redacted] weeks gestation of pregnancy   4. Bacterial vaginosis    Allergies as of 02/15/2019   No Known Allergies     Medication List    STOP taking these medications   ibuprofen 600 MG tablet Commonly known as:  ADVIL,MOTRIN   ibuprofen 800 MG tablet Commonly known as:  ADVIL,MOTRIN   norgestimate-ethinyl estradiol 0.25-35 MG-MCG tablet Commonly known as:  ORTHO-CYCLEN,SPRINTEC,PREVIFEM   PARoxetine 20 MG tablet Commonly known as:  Paxil     TAKE these medications   acetaminophen 500 MG tablet Commonly known as:  TYLENOL Take 1,000 mg by mouth every 8 (eight) hours as needed for mild pain.   albuterol 108 (90 Base) MCG/ACT inhaler Commonly known as:  PROVENTIL HFA;VENTOLIN HFA Inhale 1-2 puffs into the lungs every 6 (six) hours as needed for wheezing or shortness of breath.   metroNIDAZOLE 500 MG tablet Commonly known as:  FLAGYL Take 1 tablet (500 mg total) by mouth 2 (two) times daily for 7 days.   promethazine 12.5 MG tablet Commonly known as:  PHENERGAN Take 1 tablet (12.5 mg total) by mouth every 8 (eight) hours as needed for up to 30 doses for nausea or vomiting.   Vitafol Gummies 3.33-0.333-34.8 MG Chew Chew 3 tablets by mouth daily before breakfast.      -N/V/PID/return MAU precautions given -RX sent for metronidazole for BV and phenergan for N/V -pt discharged to home in stable condition with someone to drive her -call [facility] this week to make a NOB appt for 1-2wks  Joni Reining E  02/15/2019, 12:39 PM

## 2019-02-16 LAB — CULTURE, OB URINE

## 2019-02-16 LAB — CERVICOVAGINAL ANCILLARY ONLY
Chlamydia: NEGATIVE
Neisseria Gonorrhea: NEGATIVE

## 2019-02-16 LAB — GC/CHLAMYDIA PROBE AMP (~~LOC~~) NOT AT ARMC
Chlamydia: NEGATIVE
Neisseria Gonorrhea: NEGATIVE

## 2019-03-10 IMAGING — US US MFM OB DETAIL+14 WK
1 series · 14 of 28 positions shown · non-contrast
Comparison: none

[Series 1: us mfm ob detail+14 wk · 14 of 103 slices shown]
[im 4/103]
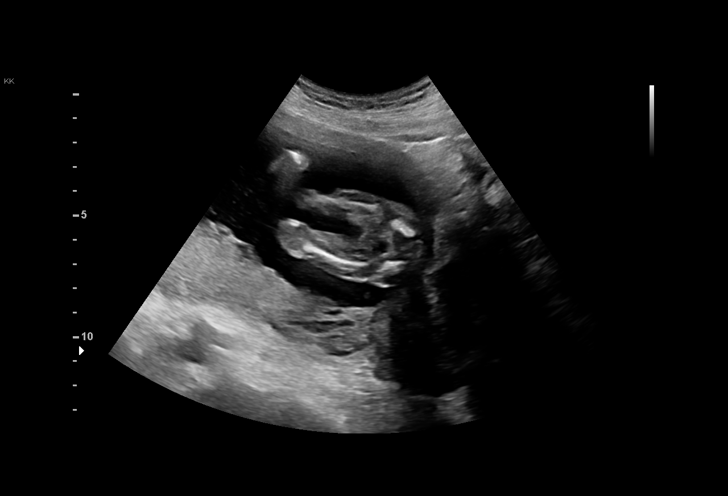
[im 12/103]
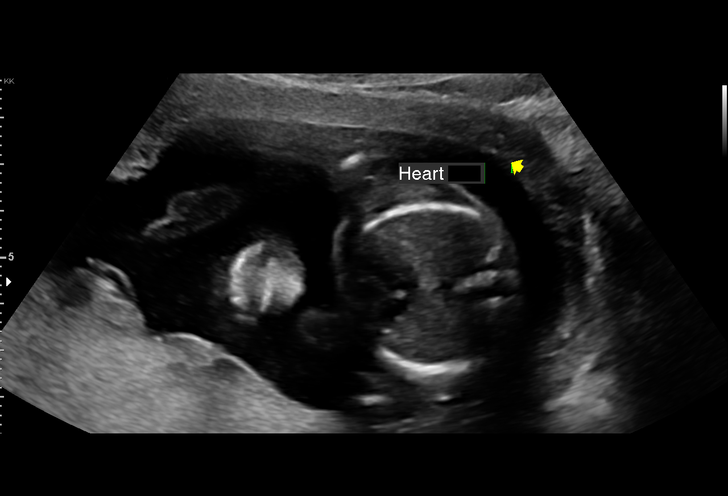
[im 19/103]
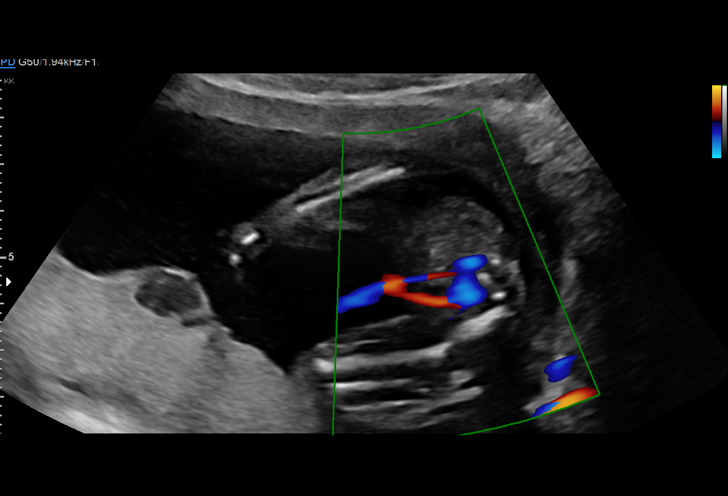
[im 27/103]
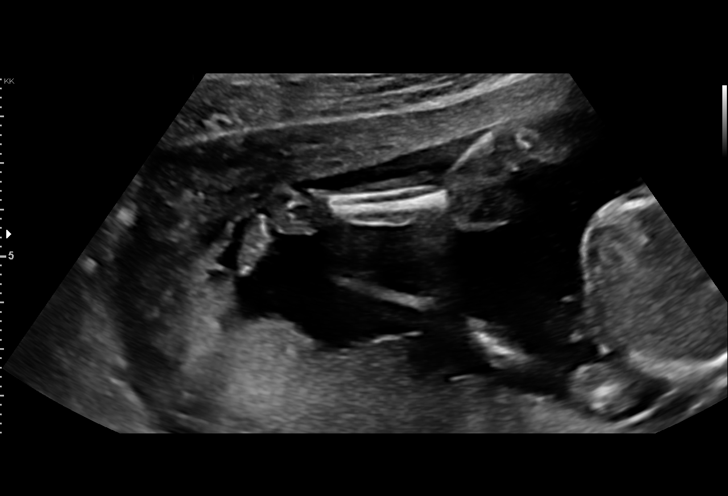
[im 35/103]
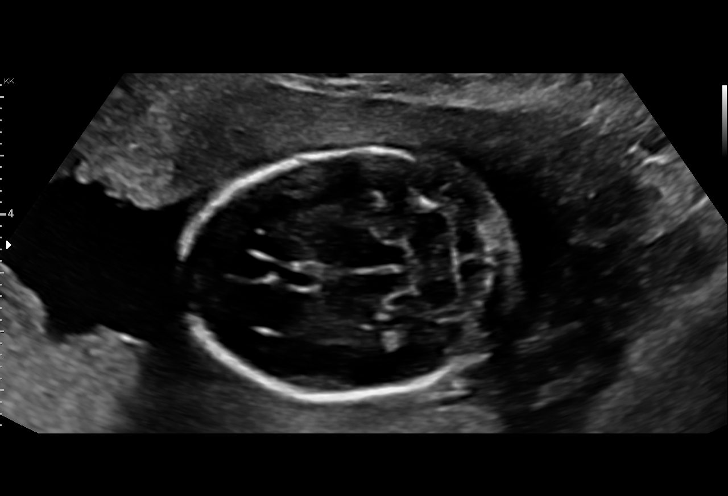
[im 42/103]
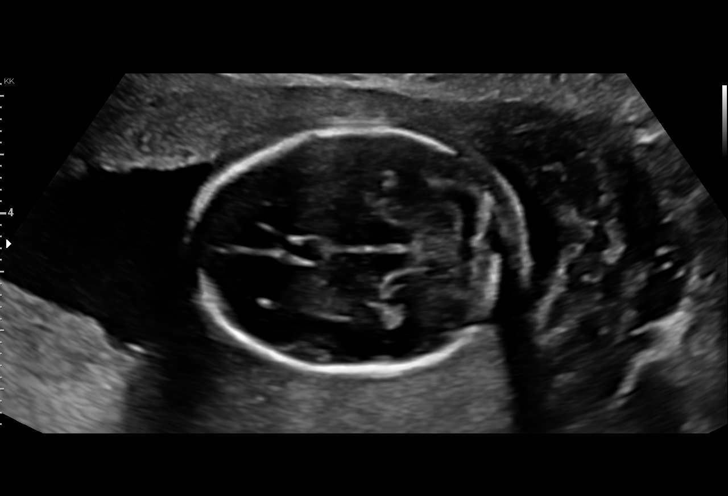
[im 50/103]
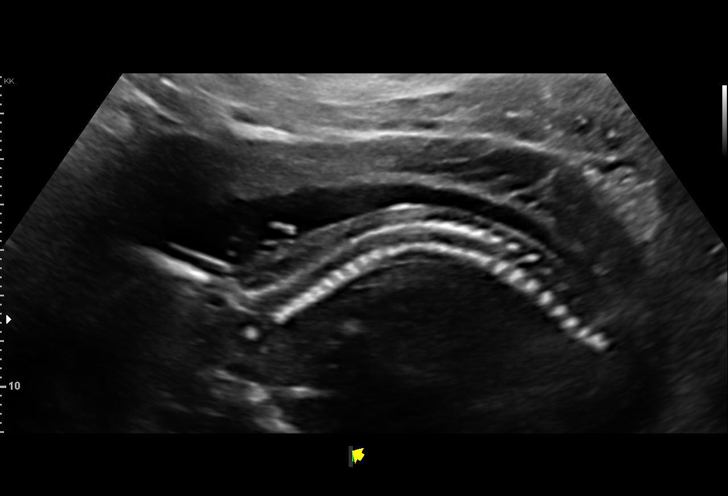
[im 57/103]
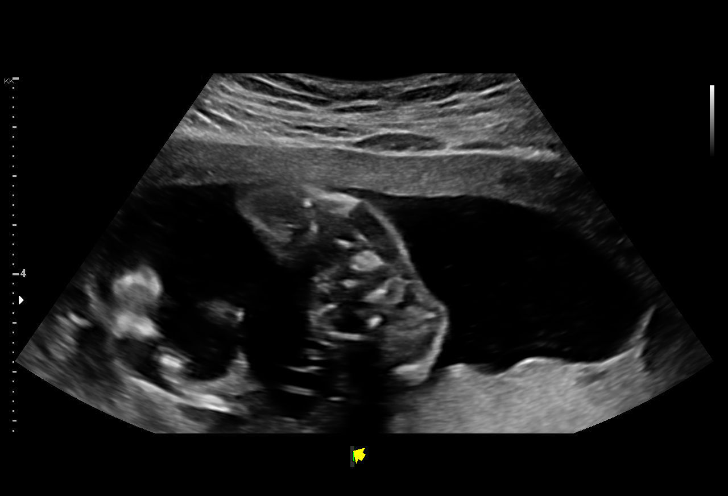
[im 65/103]
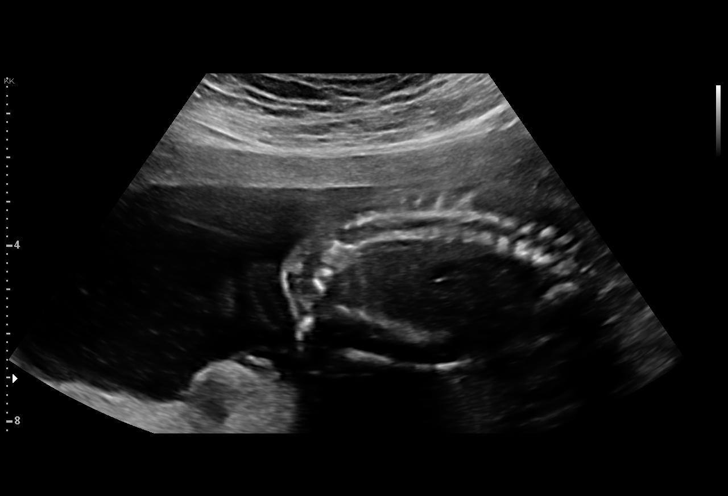
[im 72/103]
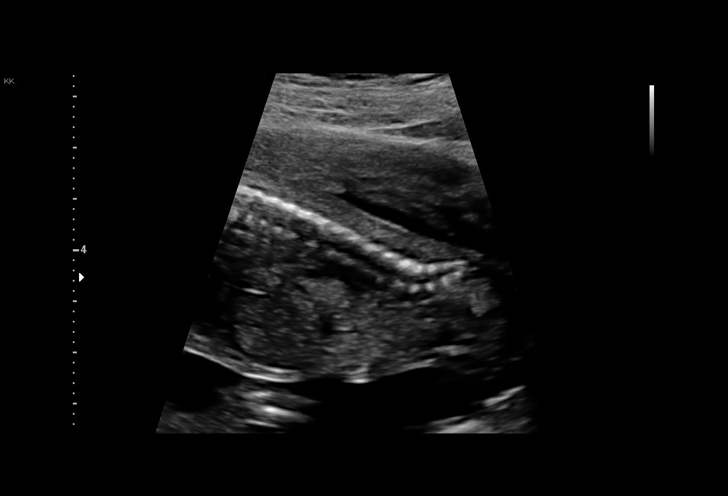
[im 80/103]
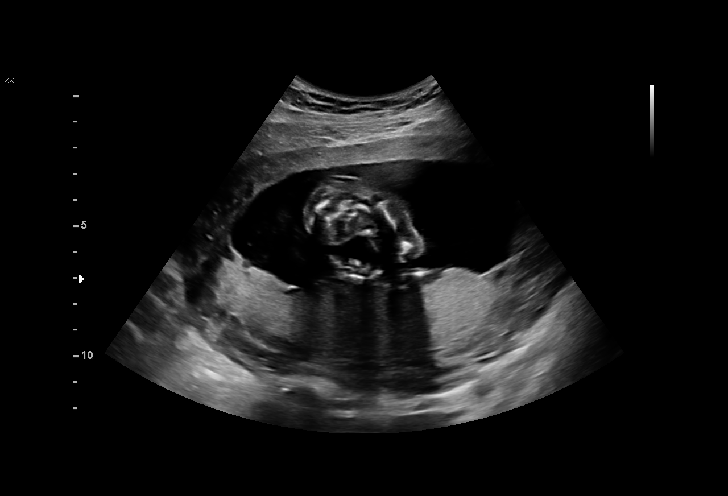
[im 87/103]
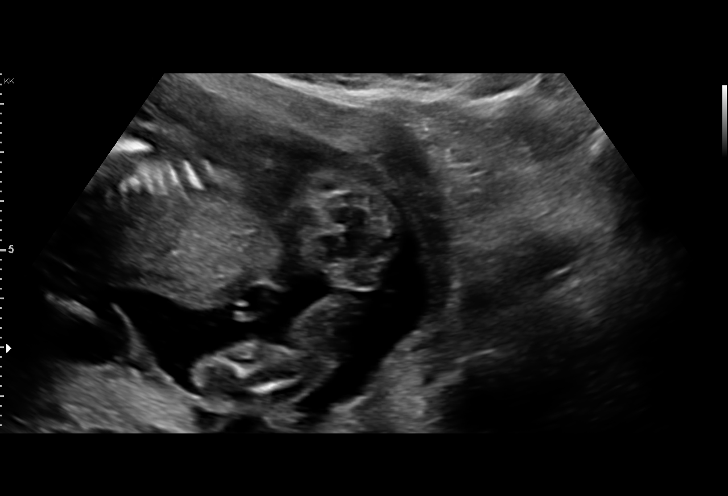
[im 95/103]
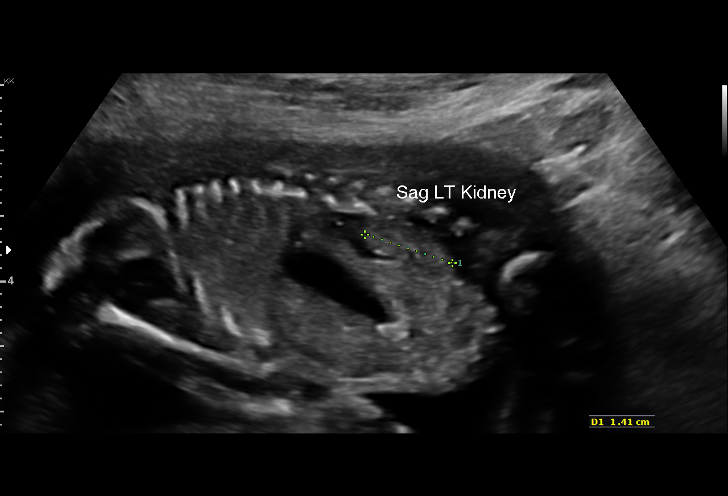
[im 103/103]
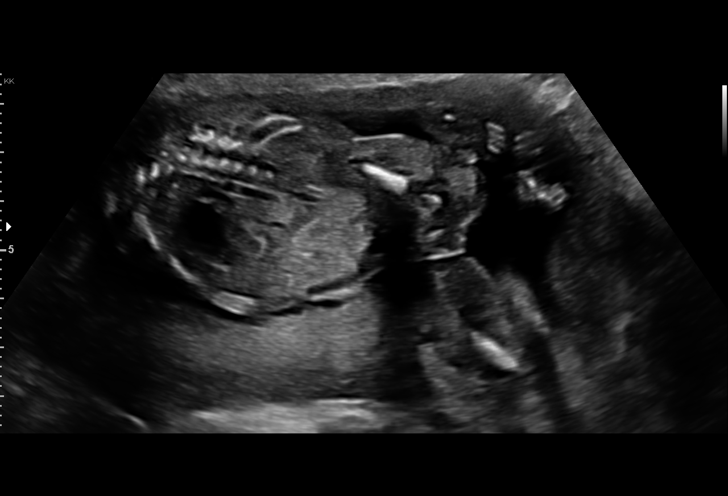

[14 of 28 positions shown; findings below may reference images not displayed]

1  MILOS MEDO PAPIGA           [id number]      [id number]     [id number]
Indications

19 weeks gestation of pregnancy
Encounter for antenatal screening for
malformations
Obesity complicating pregnancy, second
trimester
OB History

Blood Type:            Height:  5'5"   Weight (lb):  234      BMI:
Gravidity:    4         Term:   2        Prem:   0        SAB:   1
TOP:          0       Ectopic:  0        Living: 2
Fetal Evaluation

Num Of Fetuses:     1
Fetal Heart         150
Rate(bpm):
Cardiac Activity:   Observed
Presentation:       Breech
Placenta:           Posterior, above cervical os
P. Cord Insertion:  Visualized

Amniotic Fluid
AFI FV:      Subjectively within normal limits

Largest Pocket(cm)
2.7
Biometry

BPD:      40.4  mm     G. Age:  18w 2d         12  %    CI:        64.61   %   70 - 86
FL/HC:      17.6   %   16.1 -
HC:      161.6  mm     G. Age:  19w 0d         27  %    HC/AC:      1.21       1.09 -
AC:      133.3  mm     G. Age:  18w 6d         31  %    FL/BPD:     70.5   %
FL:       28.5  mm     G. Age:  18w 5d         25  %    FL/AC:      21.4   %   20 - 24
NFT:       3.7  mm

Est. FW:     257  gm      0 lb 9 oz     36  %
Gestational Age

LMP:           19w 2d       Date:   10/04/16                 EDD:   07/11/17
U/S Today:     18w 5d                                        EDD:   07/15/17
Best:          19w 2d    Det. By:   LMP  (10/04/16)          EDD:   07/11/17
Anatomy

Cranium:               Appears normal         Aortic Arch:            Appears normal
Cavum:                 Appears normal         Ductal Arch:            Appears normal
Ventricles:            Appears normal         Diaphragm:              Not well visualized
Choroid Plexus:        Appears normal         Stomach:                Appears normal, left
sided
Cerebellum:            Appears normal         Abdomen:                Appears normal
Posterior Fossa:       Appears normal         Abdominal Wall:         Not well visualized
Nuchal Fold:           Appears normal         Cord Vessels:           Appears normal (3
vessel cord)
Face:                  Appears normal         Kidneys:                Appear normal
(orbits and profile)
Lips:                  Appears normal         Bladder:                Appears normal
Thoracic:              Appears normal         Spine:                  Appears normal
Heart:                 Not well visualized    Upper Extremities:      Appears normal
RVOT:                  Not well visualized    Lower Extremities:      Appears normal
LVOT:                  Not well visualized

Other:  Female gender. Heels and 5th digit visualized. Technically difficult
due to fetal position.
Cervix Uterus Adnexa

Cervix
Length:            3.9  cm.
Normal appearance by transabdominal scan.
Impression

Singleton intrauterine pregnancy at 19+2 weeks, here for
anatomic survey
Review of the anatomy shows no sonographic markers for
aneuploidy or structural anomalies
However, cardiac visualization should be considered
suboptimal secondary to maternal body habitus and fetal
position
Amniotic fluid volume is normal
Estimated fetal weight is 257g which is growth in the 36th
percentile
Recommendations

Recommend repeat exam in 4-6 weeks to complete anatomic
survey

## 2019-03-25 ENCOUNTER — Encounter: Payer: Medicaid Other | Admitting: Obstetrics & Gynecology

## 2019-04-08 DIAGNOSIS — Z3A14 14 weeks gestation of pregnancy: Secondary | ICD-10-CM | POA: Diagnosis not present

## 2019-04-08 DIAGNOSIS — Z113 Encounter for screening for infections with a predominantly sexual mode of transmission: Secondary | ICD-10-CM | POA: Diagnosis not present

## 2019-04-08 DIAGNOSIS — Z124 Encounter for screening for malignant neoplasm of cervix: Secondary | ICD-10-CM | POA: Diagnosis not present

## 2019-04-08 DIAGNOSIS — N925 Other specified irregular menstruation: Secondary | ICD-10-CM | POA: Diagnosis not present

## 2019-04-08 DIAGNOSIS — O3680X9 Pregnancy with inconclusive fetal viability, other fetus: Secondary | ICD-10-CM | POA: Diagnosis not present

## 2019-04-19 DIAGNOSIS — Z3482 Encounter for supervision of other normal pregnancy, second trimester: Secondary | ICD-10-CM | POA: Diagnosis not present

## 2019-04-19 DIAGNOSIS — Z8759 Personal history of other complications of pregnancy, childbirth and the puerperium: Secondary | ICD-10-CM | POA: Diagnosis not present

## 2019-04-19 DIAGNOSIS — Z3A15 15 weeks gestation of pregnancy: Secondary | ICD-10-CM | POA: Diagnosis not present

## 2019-04-27 ENCOUNTER — Telehealth: Payer: Self-pay

## 2019-04-27 NOTE — Telephone Encounter (Signed)
NOTES ON FILE FROM [facility] [phone], REFERRAL SENT TO SCHEDULING 

## 2019-05-17 DIAGNOSIS — R102 Pelvic and perineal pain: Secondary | ICD-10-CM | POA: Diagnosis not present

## 2019-05-22 NOTE — Progress Notes (Deleted)
Cardiology Office Note   Date:  05/22/2019   ID:  Amanda GumWendy C Weant, DOB [birth year], MRN [id number]  PCP:  Patient, No Pcp Per  Cardiologist:   No primary care provider on file. Referring:  ***  No chief complaint on file.     History of Present Illness: Amanda Melton is a 33 y.o. female who is referred by *** for evaluation of palpitations and gestational HTN.  She is *** weeks pregnant.  ***   Past Medical History:  Diagnosis Date  . Asthma   . Chlamydia 2005  . Environmental allergies   . Hypertension    gestational hypertension    Past Surgical History:  Procedure Laterality Date  . NO PAST SURGERIES       Current Outpatient Medications  Medication Sig Dispense Refill  . acetaminophen (TYLENOL) 500 MG tablet Take 1,000 mg by mouth every 8 (eight) hours as needed for mild pain.    Marland Kitchen. albuterol (PROVENTIL HFA;VENTOLIN HFA) 108 (90 BASE) MCG/ACT inhaler Inhale 1-2 puffs into the lungs every 6 (six) hours as needed for wheezing or shortness of breath. 1 Inhaler 0  . Prenatal Vit-Fe Phos-FA-Omega (VITAFOL GUMMIES) 3.33-0.333-34.8 MG CHEW Chew 3 tablets by mouth daily before breakfast. 90 tablet 11  . promethazine (PHENERGAN) 12.5 MG tablet Take 1 tablet (12.5 mg total) by mouth every 8 (eight) hours as needed for up to 30 doses for nausea or vomiting. 30 tablet 0   No current facility-administered medications for this visit.     Allergies:   Patient has no known allergies.    Social History:  The patient  reports that she has never smoked. She has never used smokeless tobacco. She reports previous alcohol use of about 3.0 standard drinks of alcohol per week. She reports that she does not use drugs.   Family History:  The patient's ***family history includes Diabetes in her father; Hypertension in her father; Kidney failure in her father.    ROS:  Please see the history of present illness.   Otherwise, review of systems are positive for {NONE DEFAULTED:18576::"none"}.    All other systems are reviewed and negative.    PHYSICAL EXAM: VS:  LMP 01/15/2019 Comment:  2 periods in March , BMI There is no height or weight on file to calculate BMI. GENERAL:  Well appearing HEENT:  Pupils equal round and reactive, fundi not visualized, oral mucosa unremarkable NECK:  No jugular venous distention, waveform within normal limits, carotid upstroke brisk and symmetric, no bruits, no thyromegaly LYMPHATICS:  No cervical, inguinal adenopathy LUNGS:  Clear to auscultation bilaterally BACK:  No CVA tenderness CHEST:  Unremarkable HEART:  PMI not displaced or sustained,S1 and S2 within normal limits, no S3, no S4, no clicks, no rubs, *** murmurs ABD:  Flat, positive bowel sounds normal in frequency in pitch, no bruits, no rebound, no guarding, no midline pulsatile mass, no hepatomegaly, no splenomegaly EXT:  2 plus pulses throughout, no edema, no cyanosis no clubbing SKIN:  No rashes no nodules NEURO:  Cranial nerves II through XII grossly intact, motor grossly intact throughout PSYCH:  Cognitively intact, oriented to person place and time    EKG:  EKG {ACTION; IS/IS [id number]} ordered today. The ekg ordered today demonstrates ***   Recent Labs: 02/15/2019: ALT 14; BUN <5; Creatinine, Ser 0.79; Hemoglobin 12.5; Platelets 225; Potassium 3.6; Sodium 134    Lipid Panel    Component Value Date/Time   CHOL 175 01/09/2016 1557   TRIG 110  01/09/2016 1557   HDL 39 (L) 01/09/2016 1557   CHOLHDL 4.5 01/09/2016 1557   VLDL 22 01/09/2016 1557   LDLCALC 114 01/09/2016 1557      Wt Readings from Last 3 Encounters:  02/15/19 220 lb 8 oz (100 kg)  01/25/18 243 lb (110.2 kg)  07/20/17 254 lb 12.8 oz (115.6 kg)      Other studies Reviewed: Additional studies/ records that were reviewed today include: ***. Review of the above records demonstrates:  Please see elsewhere in the note.  ***   ASSESSMENT AND PLAN:  HTN:  ***  PALPITATIONS:  ***   Current medicines  are reviewed at length with the patient today.  The patient {ACTIONS; HAS/DOES NOT HAVE:19233} concerns regarding medicines.  The following changes have been made:  {PLAN; NO CHANGE:13088:s}  Labs/ tests ordered today include: *** No orders of the defined types were placed in this encounter.    Disposition:   FU with ***    Signed, Minus Breeding, MD  05/22/2019 2:57 PM    [facility]

## 2019-05-23 ENCOUNTER — Telehealth: Payer: Self-pay

## 2019-05-23 NOTE — Telephone Encounter (Signed)
[id number]-ASK COVID QUESTIONS FOR APPT 05-24-2019 9AM

## 2019-05-24 ENCOUNTER — Ambulatory Visit: Payer: Medicaid Other | Admitting: Cardiology

## 2019-05-24 NOTE — Telephone Encounter (Signed)
PT CB AND RESCHEDULED APPT

## 2019-06-01 DIAGNOSIS — O26892 Other specified pregnancy related conditions, second trimester: Secondary | ICD-10-CM | POA: Diagnosis not present

## 2019-06-01 DIAGNOSIS — Z3A21 21 weeks gestation of pregnancy: Secondary | ICD-10-CM | POA: Diagnosis not present

## 2019-06-01 DIAGNOSIS — R102 Pelvic and perineal pain: Secondary | ICD-10-CM | POA: Diagnosis not present

## 2019-06-01 DIAGNOSIS — O99212 Obesity complicating pregnancy, second trimester: Secondary | ICD-10-CM | POA: Diagnosis not present

## 2019-06-12 IMAGING — US US PELVIS COMPLETE TRANSABD/TRANSVAG
1 series · 14 of 25 positions shown · non-contrast
Comparison: None

CLINICAL DATA: Pelvic pain for 3 weeks

EXAM:
TRANSABDOMINAL AND TRANSVAGINAL ULTRASOUND OF PELVIS
TECHNIQUE: Both transabdominal and transvaginal ultrasound examinations of the
pelvis were performed. Transabdominal technique was performed for
global imaging of the pelvis including uterus, ovaries, adnexal
regions, and pelvic cul-de-sac. It was necessary to proceed with
endovaginal exam following the transabdominal exam to visualize the
endometrium and RIGHT ovary.

[Series 1: us pelvis complete transabd/transvag · 0.19mm/px · 14 of 63 slices shown]
[im 1/63]
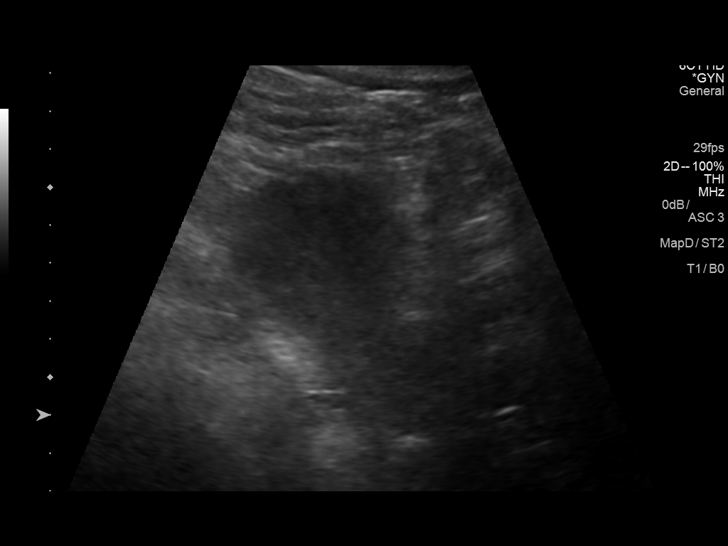
[im 6/63]
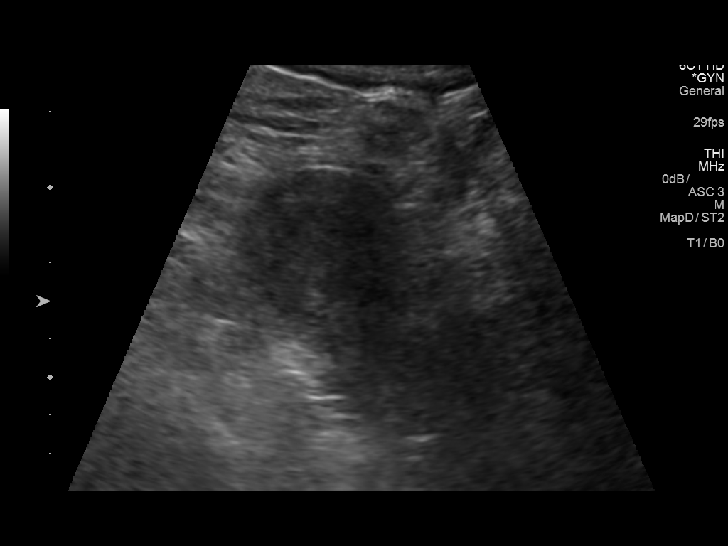
[im 11/63]
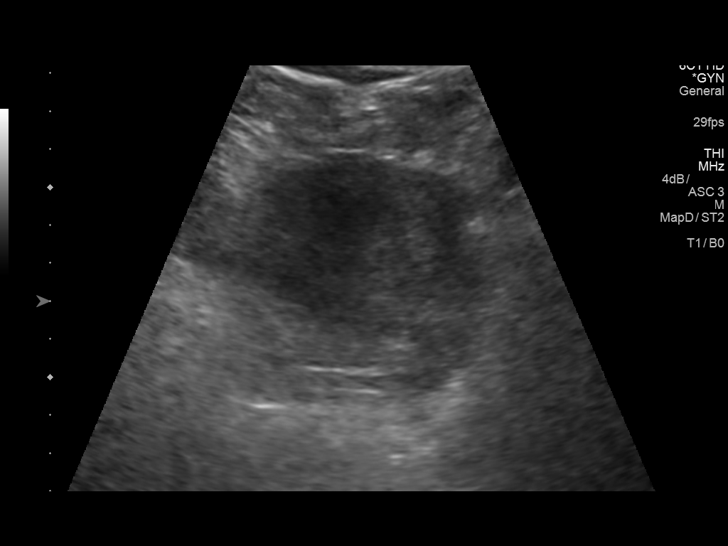
[im 16/63]
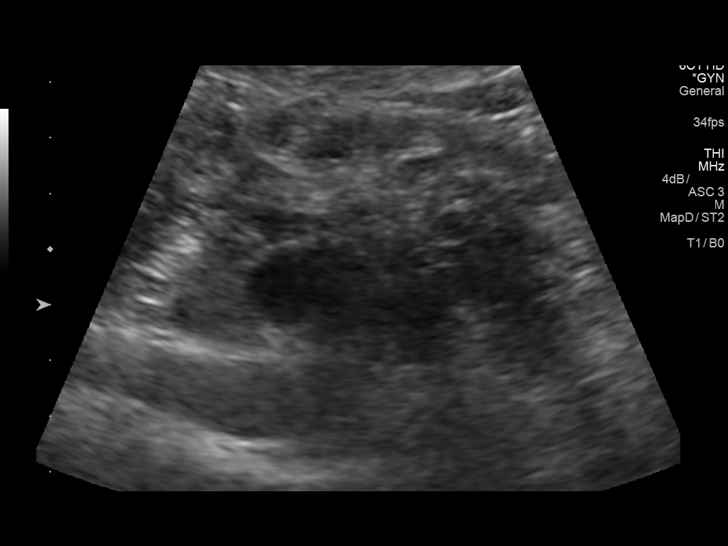
[im 21/63]
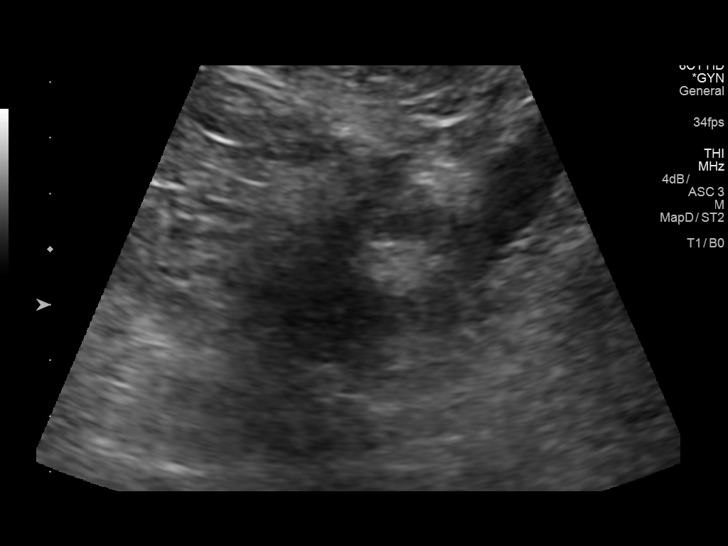
[im 24/63]
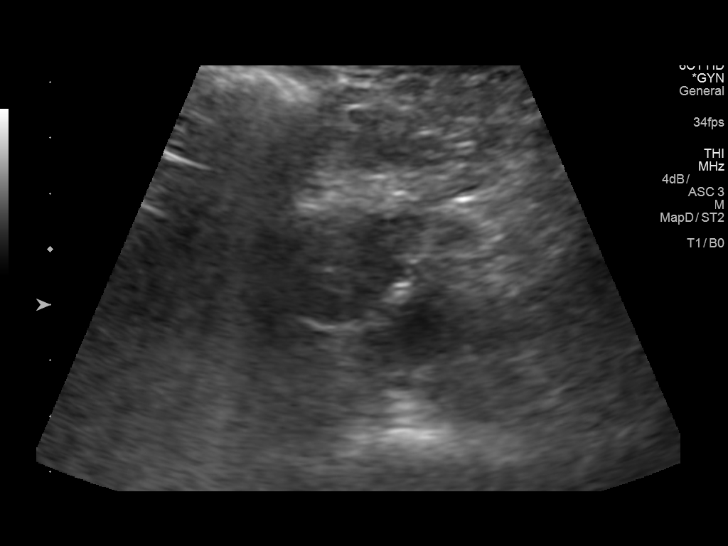
[im 29/63]
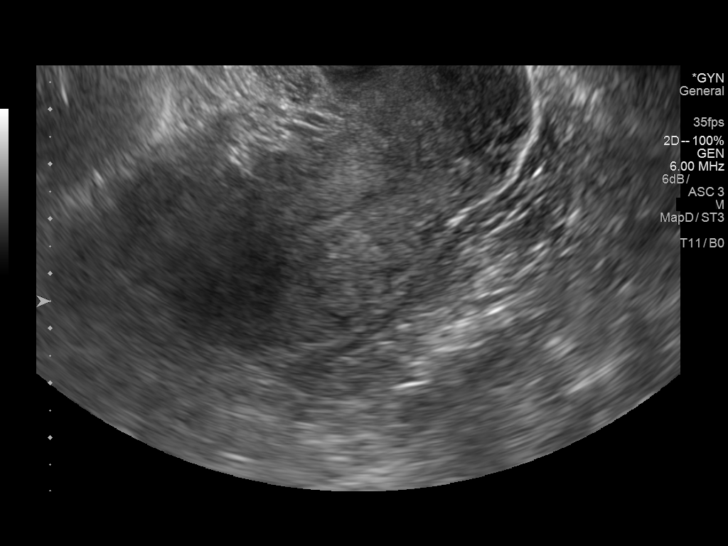
[im 34/63]
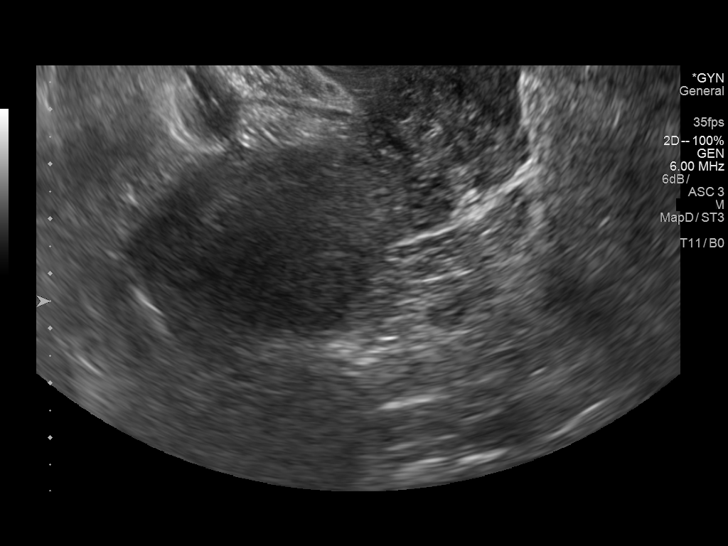
[im 39/63]
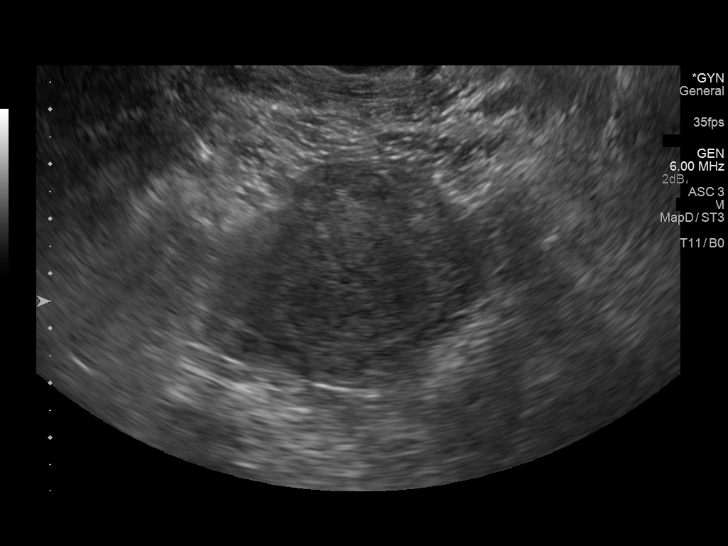
[im 42/63]
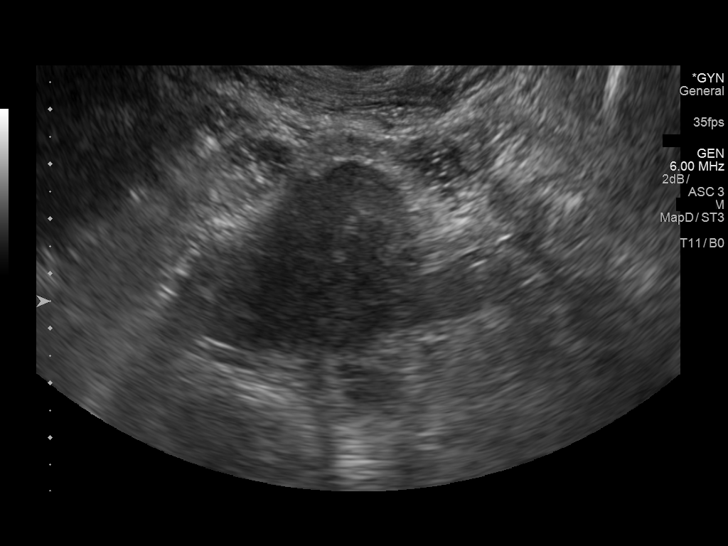
[im 47/63]
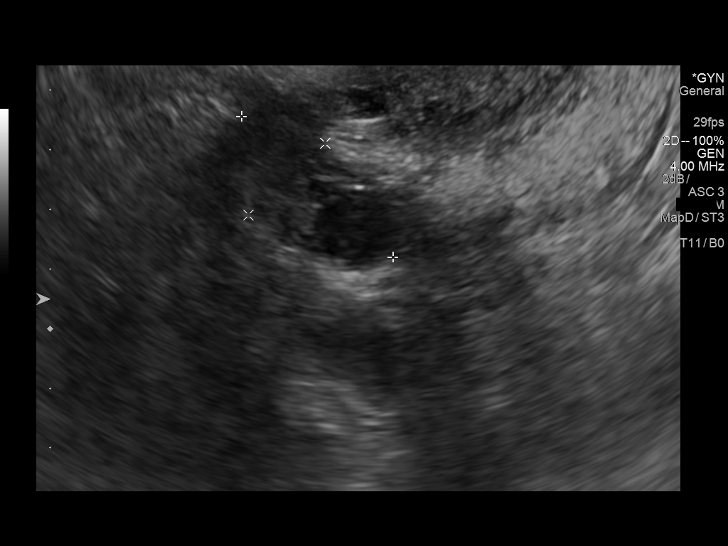
[im 52/63]
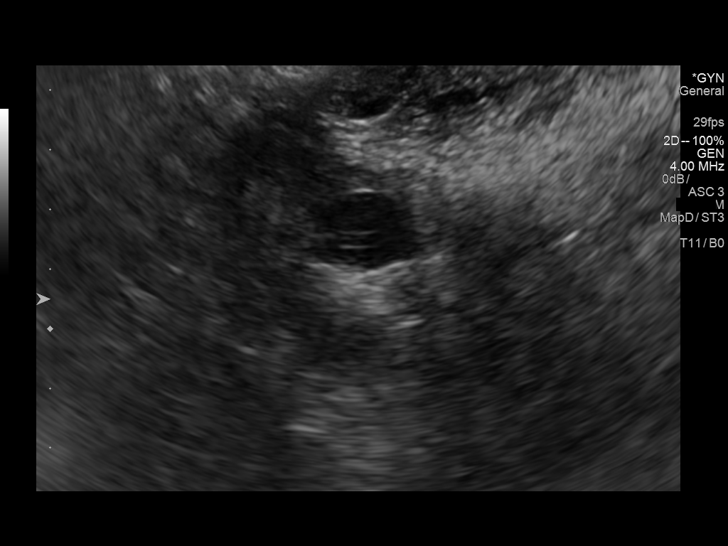
[im 57/63]
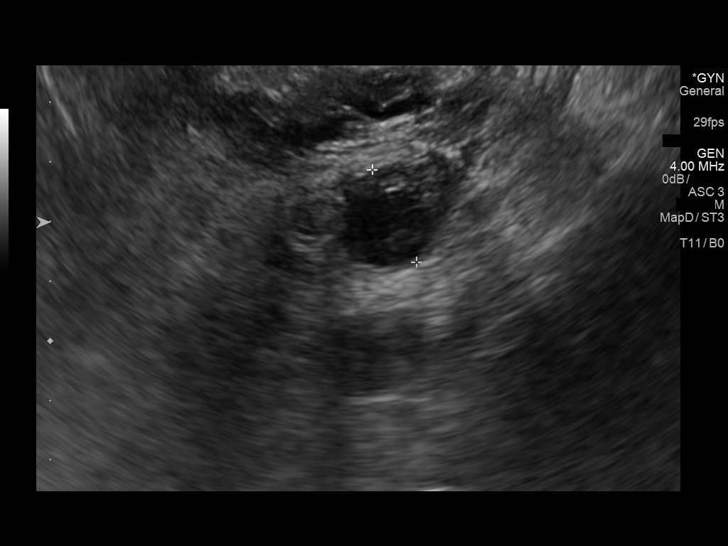
[im 63/63]
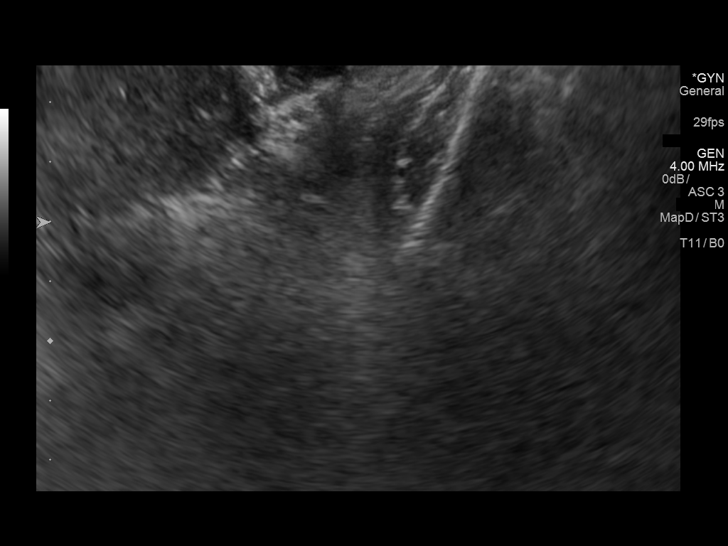

[14 of 25 positions shown; findings below may reference images not displayed]

FINDINGS: Uterus

Measurements: 8.1 x 3.8 x 4.8 cm.  Normal morphology without mass

Endometrium

Thickness: 3 mm thick.  No endometrial fluid or focal abnormality

Right ovary

Not visualized on either transabdominal or endovaginal imaging
suspect obscured by bowel

Left ovary

Measurements: 3.5 x 1.8 x 1.7 cm.  Normal morphology without mass.

Other findings

No free pelvic fluid or adnexal masses.
IMPRESSION: Normal exam.

## 2019-06-27 DIAGNOSIS — Z3492 Encounter for supervision of normal pregnancy, unspecified, second trimester: Secondary | ICD-10-CM | POA: Diagnosis not present

## 2019-06-27 DIAGNOSIS — N760 Acute vaginitis: Secondary | ICD-10-CM | POA: Diagnosis not present

## 2019-06-27 DIAGNOSIS — O283 Abnormal ultrasonic finding on antenatal screening of mother: Secondary | ICD-10-CM | POA: Diagnosis not present

## 2019-06-27 DIAGNOSIS — N898 Other specified noninflammatory disorders of vagina: Secondary | ICD-10-CM | POA: Diagnosis not present

## 2019-06-27 DIAGNOSIS — Z3A25 25 weeks gestation of pregnancy: Secondary | ICD-10-CM | POA: Diagnosis not present

## 2019-07-13 DIAGNOSIS — Z3A27 27 weeks gestation of pregnancy: Secondary | ICD-10-CM | POA: Diagnosis not present

## 2019-07-13 DIAGNOSIS — O283 Abnormal ultrasonic finding on antenatal screening of mother: Secondary | ICD-10-CM | POA: Diagnosis not present

## 2019-07-26 DIAGNOSIS — Z8759 Personal history of other complications of pregnancy, childbirth and the puerperium: Secondary | ICD-10-CM | POA: Diagnosis not present

## 2019-07-26 DIAGNOSIS — Z3402 Encounter for supervision of normal first pregnancy, second trimester: Secondary | ICD-10-CM | POA: Diagnosis not present

## 2019-07-26 DIAGNOSIS — J45909 Unspecified asthma, uncomplicated: Secondary | ICD-10-CM | POA: Diagnosis not present

## 2019-07-26 DIAGNOSIS — Z23 Encounter for immunization: Secondary | ICD-10-CM | POA: Diagnosis not present

## 2019-07-26 DIAGNOSIS — Z3A29 29 weeks gestation of pregnancy: Secondary | ICD-10-CM | POA: Diagnosis not present

## 2019-07-26 DIAGNOSIS — R3989 Other symptoms and signs involving the genitourinary system: Secondary | ICD-10-CM | POA: Diagnosis not present

## 2019-07-28 NOTE — Progress Notes (Signed)
Amanda Melton   Date:  07/29/2019   ID:  Amanda Melton, DOB 1986/04/04, MRN [id number]  PCP:  Patient, No Pcp Per  Cardiologist:   Rollene Rotunda, MD Referring:  Deno Etienne, MD  Chief Complaint  Patient presents with   Palpitations      History of Present Illness: Amanda Melton is a 33 y.o. female who is referred by Deno Etienne, MD for evaluation of HTN and palpitations associated with pregnancy.  The patient is [redacted] weeks pregnant with her fourth child.  She has had some hypertension during pregnancy.  She has not had any cardiac history otherwise.  She is never had any prior cardiac history.  She is not noticing palpitations.  These happen sporadically.  They happen at rest.  They probably happen more when she is at work or with emotional stress.  She feels skipped beats but she is not describing sustained tachyarrhythmias.  She is not had any presyncope or syncope.  She is not describing chest pressure, neck or arm discomfort.  She is on bedrest right now because of some issues with the pregnancy.  She is having a return visit in a week or so.  Has had some mild swelling in her hands.  She does have some mild shortness of breath but not PND or orthopnea.  Past Medical History:  Diagnosis Date   Asthma    Chlamydia 2005   Environmental allergies    Hypertension    gestational hypertension    Past Surgical History:  Procedure Laterality Date   NO PAST SURGERIES       Current Outpatient Medications  Medication Sig Dispense Refill   acetaminophen (TYLENOL) 500 MG tablet Take 1,000 mg by mouth every 8 (eight) hours as needed for mild pain.     albuterol (PROVENTIL HFA;VENTOLIN HFA) 108 (90 BASE) MCG/ACT inhaler Inhale 1-2 puffs into the lungs every 6 (six) hours as needed for wheezing or shortness of breath. 1 Inhaler 0   Prenatal Vit-Fe Phos-FA-Omega (VITAFOL GUMMIES) 3.33-0.333-34.8 MG CHEW Chew 3 tablets by mouth daily before breakfast. 90 tablet 11    promethazine (PHENERGAN) 12.5 MG tablet Take 1 tablet (12.5 mg total) by mouth every 8 (eight) hours as needed for up to 30 doses for nausea or vomiting. 30 tablet 0   No current facility-administered medications for this visit.     Allergies:   Patient has no known allergies.    Social History:  The patient  reports that she has never smoked. She has never used smokeless tobacco. She reports previous alcohol use of about 3.0 standard drinks of alcohol per week. She reports that she does not use drugs.   Family History:  The patient's \ family history includes Diabetes in her father; Hypertension in her father; Kidney failure in her father.    ROS:  Please see the history of present illness.   Otherwise, review of systems are positive for none.   All other systems are reviewed and negative.    PHYSICAL EXAM: VS:  BP (!) 144/68    Pulse (!) 102    Temp 98.4 F (36.9 C) (Temporal)    Ht 5\' 5"  (1.651 m)    Wt 253 lb 6.4 oz (114.9 kg)    LMP 01/15/2019 Comment:  2 periods in March   SpO2 95%    BMI 42.17 kg/m  , BMI Body mass index is 42.17 kg/m. GENERAL:  Well appearing HEENT:  Pupils equal round  and reactive, fundi not visualized, oral mucosa unremarkable NECK:  No jugular venous distention, waveform within normal limits, carotid upstroke brisk and symmetric, no bruits, no thyromegaly LYMPHATICS:  No cervical, inguinal adenopathy LUNGS:  Clear to auscultation bilaterally BACK:  No CVA tenderness CHEST:  Unremarkable HEART:  PMI not displaced or sustained,S1 and S2 within normal limits, no S3, no S4, no clicks, no rubs, no murmurs ABD:  Flat, positive bowel sounds normal in frequency in pitch, no bruits, no rebound, no guarding, no midline pulsatile mass, no hepatomegaly, no splenomegaly, gravid EXT:  2 plus pulses throughout, no edema, no cyanosis no clubbing SKIN:  No rashes no nodules NEURO:  Cranial nerves II through XII grossly intact, motor grossly intact throughout PSYCH:   Cognitively intact, oriented to person place and time    EKG:  EKG is ordered today. The ekg ordered today demonstrates sinus rhythm, rate 102, axis within normal limits, intervals within normal limits, no acute ST-T wave changes.   Recent Labs: 02/15/2019: ALT 14; BUN <5; Creatinine, Ser 0.79; Hemoglobin 12.5; Platelets 225; Potassium 3.6; Sodium 134    Lipid Panel    Component Value Date/Time   CHOL 175 01/09/2016 1557   TRIG 110 01/09/2016 1557   HDL 39 (L) 01/09/2016 1557   CHOLHDL 4.5 01/09/2016 1557   VLDL 22 01/09/2016 1557   LDLCALC 114 01/09/2016 1557     Wt Readings from Last 3 Encounters:  07/29/19 253 lb 6.4 oz (114.9 kg)  02/15/19 220 lb 8 oz (100 kg)  01/25/18 243 lb (110.2 kg)      Other studies Reviewed: Additional studies/ records that were reviewed today include: Labs. Review of the above records demonstrates:  Please see elsewhere in the [facility].     ASSESSMENT AND PLAN:  PALPITATIONS:   The patient seems to be describing PVCs and PACs.  To get a 2-week event monitor and a TSH.  We talked about this and would manage it conservatively for now unless she has increasing symptoms.  HTN: Blood pressure is borderline.  She can keep a blood pressure diary as she does have a device.  She was on carvedilol during her previous pregnancy and might need a beta-blocker pending these results.  Current medicines are reviewed at length with the patient today.  The patient does not have concerns regarding medicines.  The following changes have been made:  no change  Labs/ tests ordered today include:   Orders Placed This Encounter  Procedures   TSH   LONG TERM MONITOR (3-14 DAYS)   EKG 12-Lead     Disposition:   FU with me as needed.     Signed, Minus Breeding, MD  07/29/2019 12:45 PM    [place]

## 2019-07-29 ENCOUNTER — Ambulatory Visit (INDEPENDENT_AMBULATORY_CARE_PROVIDER_SITE_OTHER): Payer: Medicaid Other | Admitting: Cardiology

## 2019-07-29 ENCOUNTER — Encounter: Payer: Self-pay | Admitting: Cardiology

## 2019-07-29 ENCOUNTER — Other Ambulatory Visit: Payer: Self-pay

## 2019-07-29 VITALS — BP 144/68 | HR 102 | Temp 98.4°F | Ht 65.0 in | Wt 253.4 lb

## 2019-07-29 DIAGNOSIS — I10 Essential (primary) hypertension: Secondary | ICD-10-CM | POA: Diagnosis not present

## 2019-07-29 DIAGNOSIS — R002 Palpitations: Secondary | ICD-10-CM | POA: Diagnosis not present

## 2019-07-29 NOTE — Patient Instructions (Addendum)
Medication Instructions:  Your physician recommends that you continue on your current medications as directed. Please refer to the Current Medication list given to you today.  If you need a refill on your cardiac medications before your next appointment, please call your pharmacy.   Lab work: TSH If you have labs (blood work) drawn today and your tests are completely normal, you will receive your results only by: [facility] (if you have MyChart) OR A paper copy in the mail If you have any lab test that is abnormal or we need to change your treatment, we will call you to review the results.  Testing/Procedures: Your physician has recommended that you wear an 2 week event monitor. Event monitors are medical devices that record the heart's electrical activity. Doctors most often [place] these monitors to diagnose arrhythmias. Arrhythmias are problems with the speed or rhythm of the heartbeat. The monitor is a small, portable device. You can wear one while you do your normal daily activities. This is usually used to diagnose what is causing palpitations/syncope (passing out).    Follow-Up: Make appointments as needed.  Any Other Special Instructions Will Be Listed Below (If Applicable).   Ambulatory Cardiac Monitoring An ambulatory cardiac monitor is a small recording device that is used to detect abnormal heart rhythms (arrhythmias). Most monitors are connected by wires to flat, sticky disks (electrodes) that are then attached to your chest. You may need to wear a monitor if you have had symptoms such as:  Fast heartbeats (palpitations).  Dizziness.  Fainting or light-headedness.  Unexplained weakness.  Shortness of breath. There are several types of monitors. Some common monitors include:  Holter monitor. This records your heart rhythm continuously, usually for 24-48 hours.  Event (episodic) monitor. This monitor has a symptoms button, and when pushed, it will begin recording.  You need to activate this monitor to record when you have a heart-related symptom.  Automatic detection monitor. This monitor will begin recording when it detects an abnormal heartbeat. What are the risks? Generally, these devices are safe to use. However, it is possible that the skin under the electrodes will become irritated. How to prepare for monitoring Your health care provider will prepare your chest for the electrode placement and show you how to use the monitor.  Do not apply lotions to your chest before monitoring.  Follow directions on how to care for the monitor, and how to return the monitor when the testing period is complete. How to use your cardiac monitor  Follow directions about how long to wear the monitor, and if you can take the monitor off in order to shower or bathe. ? Do not let the monitor get wet. ? Do not bathe, swim, or use a hot tub while wearing the monitor.  Keep your skin clean. Do not put body lotion or moisturizer on your chest.  Change the electrodes as told by your health care provider, or any time they stop sticking to your skin. You may need to use medical tape to keep them on.  Try to put the electrodes in slightly different places on your chest to help prevent skin irritation. Follow directions from your health care provider about where to place the electrodes.  Make sure the monitor is safely clipped to your clothing or in a location close to your body as recommended by your health care provider.  If your monitor has a symptoms button, press the button to mark an event as soon as you feel a  heart-related symptom, such as: ? Dizziness. ? Weakness. ? Light-headedness. ? Palpitations. ? Thumping or pounding in your chest. ? Shortness of breath. ? Unexplained weakness.  Keep a diary of your activities, such as walking, doing chores, and taking medicine. It is very important to note what you were doing when you pushed the button to record your  symptoms. This will help your health care provider determine what might be contributing to your symptoms.  Send the recorded information as recommended by your health care provider. It may take some time for your health care provider to process the results.  Change the batteries as told by your health care provider.  Keep electronic devices away from your monitor. These include: ? Tablets. ? MP3 players. ? Cell phones.  While wearing your monitor you should avoid: ? Electric blankets. ? Firefighter. ? Electric toothbrushes. ? Microwave ovens. ? Magnets. ? Metal detectors. Get help right away if:  You have chest pain.  You have shortness of breath or extreme difficulty breathing.  You develop a very fast heartbeat that does not get better.  You develop dizziness that does not go away.  You faint or constantly feel like you are about to faint. Summary  An ambulatory cardiac monitor is a small recording device that is used to detect abnormal heart rhythms (arrhythmias).  Make sure you understand how to send the information from the monitor to your health care provider.  It is important to press the button on the monitor when you have any heart-related symptoms.  Keep a diary of your activities, such as walking, doing chores, and taking medicine. It is very important to note what you were doing when you pushed the button to record your symptoms. This will help your health care provider learn what might be causing your symptoms. This information is not intended to replace advice given to you by your health care provider. Make sure you discuss any questions you have with your health care provider. Document Released: 07/29/2008 Document Revised: 10/02/2017 Document Reviewed: 10/04/2016 Elsevier Patient Education  2020 [employer].

## 2019-07-30 LAB — TSH: TSH: 1.23 u[IU]/mL (ref 0.450–4.500)

## 2019-08-02 ENCOUNTER — Inpatient Hospital Stay (HOSPITAL_COMMUNITY)
Admission: AD | Admit: 2019-08-02 | Discharge: 2019-08-02 | Disposition: A | Discharge status: Home / Self Care | Payer: Medicaid Other | Attending: Obstetrics and Gynecology | Admitting: Obstetrics and Gynecology

## 2019-08-02 ENCOUNTER — Other Ambulatory Visit: Payer: Self-pay

## 2019-08-02 DIAGNOSIS — O281 Abnormal biochemical finding on antenatal screening of mother: Secondary | ICD-10-CM | POA: Insufficient documentation

## 2019-08-02 MED ORDER — BETAMETHASONE SOD PHOS & ACET 6 (3-3) MG/ML IJ SUSP
12.0000 mg | Freq: Once | INTRAMUSCULAR | Status: AC
  Administered 2019-08-02: 12 mg via INTRAMUSCULAR
  Filled 2019-08-02: qty 2

## 2019-08-02 NOTE — MAU Note (Signed)
Presents for BMZ injection.  Denies any other complaints.  Reports seen in office earlier today and medicine recommended

## 2019-08-02 NOTE — Progress Notes (Signed)
Ambulated off unit in stable condition accompanied by significant other.  Instructed to return tomorrow for 2nd BMZ dose 2 1830.  Pt verbalized understanding & agreeable.

## 2019-08-03 ENCOUNTER — Encounter: Payer: Medicaid Other | Attending: Obstetrics and Gynecology | Admitting: Registered"

## 2019-08-03 ENCOUNTER — Telehealth: Payer: Self-pay | Admitting: *Deleted

## 2019-08-03 ENCOUNTER — Inpatient Hospital Stay (HOSPITAL_COMMUNITY)
Admission: AD | Admit: 2019-08-03 | Discharge: 2019-08-03 | Disposition: A | Discharge status: Home / Self Care | Payer: Medicaid Other | Attending: Obstetrics & Gynecology | Admitting: Obstetrics & Gynecology

## 2019-08-03 DIAGNOSIS — O99810 Abnormal glucose complicating pregnancy: Secondary | ICD-10-CM | POA: Insufficient documentation

## 2019-08-03 DIAGNOSIS — Z3483 Encounter for supervision of other normal pregnancy, third trimester: Secondary | ICD-10-CM | POA: Diagnosis not present

## 2019-08-03 MED ORDER — BETAMETHASONE SOD PHOS & ACET 6 (3-3) MG/ML IJ SUSP
12.0000 mg | Freq: Once | INTRAMUSCULAR | Status: AC
  Administered 2019-08-03: 12 mg via INTRAMUSCULAR
  Filled 2019-08-03: qty 2

## 2019-08-03 NOTE — MAU Note (Addendum)
Pt reports to MAU for BMZ injection. Pt denies any other complications at this time no bleeding or LOF. +FM. Occasional ctxs but pt states no concern.     Temp: 98.3 BP: 126/71 Pulse:96 Pain:0

## 2019-08-03 NOTE — Progress Notes (Signed)
Patient was seen on 9.30.20 for Gestational Diabetes self-management class at [facility]. The following learning objectives were met by the patient during this course:   States the definition of Gestational Diabetes  States why dietary management is important in controlling blood glucose  Describes the effects each nutrient has on blood glucose levels  Demonstrates ability to create a balanced meal plan  Demonstrates carbohydrate counting   States when to check blood glucose levels  Demonstrates proper blood glucose monitoring techniques  States the effect of stress and exercise on blood glucose levels  States the importance of limiting caffeine and abstaining from alcohol and smoking  Blood glucose monitor given: Accu-chek Guide me Lot # [id number] Exp: 9.24.21 Blood glucose reading: 177 mg/dL  Patient instructed to monitor glucose levels: FBS: 60 - <95; 1 hour: <140; 2 hour: <120  Patient received handouts:  Nutrition Diabetes and Pregnancy, including carb counting list  Patient will be seen for follow-up as needed.

## 2019-08-03 NOTE — Telephone Encounter (Signed)
14 day ZIO XT long term holter monitor to be mailed to the patients home.  Instructions reviewed briefly as they are included in the monitor kit. 

## 2019-08-06 ENCOUNTER — Inpatient Hospital Stay (HOSPITAL_COMMUNITY)
Admission: AD | Admit: 2019-08-06 | Discharge: 2019-08-06 | Disposition: A | Discharge status: Home / Self Care | Payer: Medicaid Other | Attending: Obstetrics and Gynecology | Admitting: Obstetrics and Gynecology

## 2019-08-06 ENCOUNTER — Encounter (HOSPITAL_COMMUNITY): Payer: Self-pay | Admitting: *Deleted

## 2019-08-06 DIAGNOSIS — O26893 Other specified pregnancy related conditions, third trimester: Secondary | ICD-10-CM | POA: Diagnosis not present

## 2019-08-06 DIAGNOSIS — O133 Gestational [pregnancy-induced] hypertension without significant proteinuria, third trimester: Secondary | ICD-10-CM | POA: Diagnosis not present

## 2019-08-06 DIAGNOSIS — Z7982 Long term (current) use of aspirin: Secondary | ICD-10-CM | POA: Insufficient documentation

## 2019-08-06 DIAGNOSIS — Z3A31 31 weeks gestation of pregnancy: Secondary | ICD-10-CM | POA: Diagnosis not present

## 2019-08-06 DIAGNOSIS — R102 Pelvic and perineal pain: Secondary | ICD-10-CM | POA: Diagnosis not present

## 2019-08-06 DIAGNOSIS — O163 Unspecified maternal hypertension, third trimester: Secondary | ICD-10-CM

## 2019-08-06 LAB — URINALYSIS, ROUTINE W REFLEX MICROSCOPIC
Bilirubin Urine: NEGATIVE
Glucose, UA: 50 mg/dL — AB
Hgb urine dipstick: NEGATIVE
Ketones, ur: NEGATIVE mg/dL
Leukocytes,Ua: NEGATIVE
Nitrite: NEGATIVE
Protein, ur: NEGATIVE mg/dL
Specific Gravity, Urine: 1.004 — ABNORMAL LOW (ref 1.005–1.030)
pH: 7 (ref 5.0–8.0)

## 2019-08-06 LAB — PROTEIN / CREATININE RATIO, URINE
Creatinine, Urine: 24.3 mg/dL
Total Protein, Urine: <6 mg/dL

## 2019-08-06 LAB — COMPREHENSIVE METABOLIC PANEL
ALT: 13 U/L (ref 0–44)
AST: 14 U/L — ABNORMAL LOW (ref 15–41)
Albumin: 2.4 g/dL — ABNORMAL LOW (ref 3.5–5.0)
Alkaline Phosphatase: 56 U/L (ref 38–126)
Anion gap: 8 (ref 5–15)
BUN: <5 mg/dL — ABNORMAL LOW (ref 6–20)
CO2: 23 mmol/L (ref 22–32)
Calcium: 8.6 mg/dL — ABNORMAL LOW (ref 8.9–10.3)
Chloride: 105 mmol/L (ref 98–111)
Creatinine, Ser: 0.52 mg/dL (ref 0.44–1.00)
GFR calc Af Amer: >60 mL/min (ref >60)
GFR calc non Af Amer: >60 mL/min (ref >60)
Glucose, Bld: 129 mg/dL — ABNORMAL HIGH (ref 70–99)
Potassium: 3.8 mmol/L (ref 3.5–5.1)
Sodium: 136 mmol/L (ref 135–145)
Total Bilirubin: 0.3 mg/dL (ref 0.3–1.2)
Total Protein: 5.7 g/dL — ABNORMAL LOW (ref 6.5–8.1)

## 2019-08-06 LAB — CBC
HCT: 29.8 % — ABNORMAL LOW (ref 36.0–46.0)
Hemoglobin: 11 g/dL — ABNORMAL LOW (ref 12.0–15.0)
MCH: 37.3 pg — ABNORMAL HIGH (ref 26.0–34.0)
MCHC: 36.9 g/dL — ABNORMAL HIGH (ref 30.0–36.0)
MCV: 101 fL — ABNORMAL HIGH (ref 80.0–100.0)
Platelets: 217 10*3/uL (ref 150–400)
RBC: 2.95 MIL/uL — ABNORMAL LOW (ref 3.87–5.11)
RDW: 15.2 % (ref 11.5–15.5)
WBC: 11.3 10*3/uL — ABNORMAL HIGH (ref 4.0–10.5)
nRBC: 0.3 % — ABNORMAL HIGH (ref 0.0–0.2)

## 2019-08-06 MED ORDER — CYCLOBENZAPRINE HCL 10 MG PO TABS
10.0000 mg | ORAL_TABLET | Freq: Once | ORAL | Status: AC
  Administered 2019-08-06: 10 mg via ORAL
  Filled 2019-08-06: qty 1

## 2019-08-06 MED ORDER — CYCLOBENZAPRINE HCL 10 MG PO TABS: 5.0000 mg | ORAL_TABLET | Freq: Two times a day (BID) | ORAL | 0 refills | Status: DC | PRN

## 2019-08-06 NOTE — MAU Note (Signed)
Pt reports contractions and pelvic pressure that started around 1am. Contractions are every 15 mins. She is also reporting sharp right sided lower abdominal pain. Pt denies LOF or vaginal bleeding. Reports good fetal movement.

## 2019-08-06 NOTE — Discharge Instructions (Signed)
Pelvic pain in pregnancy/Pubic Symphysis Pain ° °Some women develop pelvic pain in pregnancy. This is sometimes called pregnancy-related pelvic girdle pain (PGP) or symphysis pubis dysfunction (SPD). °PGP is a collection of uncomfortable symptoms caused by a stiffness of your pelvic joints or the joints moving unevenly at either the back or front of your pelvis. ° °Symptoms of PGP °PGP is not harmful to your baby, but it can be painful and make it hard to get around.  °Women with PGP may feel pain:  °• over the pubic bone at the front in the center, roughly level with your hips °• across 1 or both sides of your lower back  °• in the area between your vagina and anus (perineum)  °• spreading to your thighs °Some women feel or hear a clicking or grinding in the pelvic area.  °The pain can be worse when you're:  °• walking  °• going up or down stairs °• standing on 1 leg (for example, when you're getting dressed)  °• turning over in bed  °• moving your legs apart (for example, when you get out of a car) °Most women with PGP can have a vaginal birth. ° °Non-urgent advice: Call your midwife or doctor if you have pelvic pain and: °• it's hard for you to move around °• it hurts to get out of a car or turn over in bed °• it's painful going up or down stairs °These can be signs of pregnancy-related pelvic girdle pain. ° °Treatments for PGP °Getting diagnosed as early as possible can help keep pain to a minimum and avoid long-term discomfort.  °You can ask your midwife for a referral to a physiotherapist who specializes in obstetric pelvic joint problems.  °Physiotherapy aims to relieve or ease pain, improve muscle function, and improve your pelvic joint position and stability.  °This may include:  °• manual therapy to make sure the joints of your pelvis, hip and spine move normally  °• exercises to strengthen your pelvic floor, stomach, back and hip muscles  °• exercises in water  °• advice and suggestions, including  positions for labor and birth, looking after your baby and positions for sex  °• pain relief, such as TENS °• equipment, if necessary, such as crutches or pelvic support belts °These problems tend not to get completely better until the baby is born, but treatment from an experienced practitioner can improve the symptoms during pregnancy.  °You can contact the [facility] [employer] (www.pelvicpartnership.org.uk) for information and support. ° °Coping with pelvic pain in pregnancy °Your physiotherapist may recommend a pelvic support belt to help ease your pain, or crutches to help you get around.  °It can help to plan your day so you avoid activities that cause you pain. For example, do not go up or down stairs more often than you have to.  °The Pel[facility], Obstetric & Gynaec[facility] (POGP) network also offers this advice:  °• be as active as possible within your pain limits, and avoid activities that make the pain worse  °• rest when you can  °• get help with household chores from your partner, family and friends  °• wear flat, supportive shoes  °• sit down to get dressed - for example, do not stand on 1 leg when putting on jeans  °• keep your knees together when getting in and out of the car - a plastic bag on the seat can help you swivel  °• sleep in a comfortable position - for example, on   your side with a pillow between your legs  °• try different ways of turning over in bed - for example, turning over with your knees together and squeezing your buttocks  °• take the stairs 1 at a time, or go upstairs backwards or on your bottom  °• if you're using crutches, have a small backpack to carry things in  °• if you want to have sex, consider different positions, such as kneeling on all fours  °POGP suggests that you avoid:  °• standing on 1 leg  °• bending and twisting to lift, or carrying a baby on 1 hip  °• crossing your legs  °• sitting on the floor, or sitting twisted  °• sitting or standing for long periods   °• lifting heavy weights, such as shopping bags, wet washing or a toddler  °• vacuuming  °• pushing heavy objects, such as a supermarket trolley  °• carrying anything in only 1 hand (try using a small backpack)  °The physiotherapist should be able to provide advice on coping with the emotional impact of living with chronic pain, such as using relaxation techniques. If your pain is causing you considerable distress, then you should let your doctor or midwife know. You may require additional treatment. °Download the POGP leaflet Pregnancy-related pelvic girdle pain for mothers-to-be and new mothers (https://pogp.csp.org.uk/publications). °You can get more information on managing everyday activities with PGP from the [facility] (www.pelvicpartnership.org.uk) ° °Labor and birth with pelvic pain °Many women with pelvic pain in pregnancy can have a normal vaginal birth.  °Plan ahead and talk about your birth plan with your birth partner and midwife.  °Write in your birth plan that you have PGP, so the people supporting you during labor and birth will be aware of your condition.  °Think about birth positions that are the most comfortable for you, and write them in your birth plan.  °Being in water can take the weight off your joints and allow you to move more easily, so you might want to think about having a water birth. You can discuss this with your midwife.  ° °Your 'pain-free range of movement' °If you have pain when you open your legs, find out your pain-free range of movement.  °To do this, lie on your back or sit on the edge of a chair and open your legs as far as you can without pain. °Your partner or midwife can measure the distance between your knees with a tape measure. This is your pain-free range.  °To protect your joints, try not to open your legs wider than this during labor and birth.  °This is particularly important if you have an epidural for pain relief in labor, as you won't be feeling the pain  that warns you that you're separating your legs too far.  °If you have an epidural, make sure your midwife and birth partner are aware of your pain-free range of movement of your legs. °When pushing in the second stage of labor, you may find it beneficial to lie on one side.  °This prevents your legs from being separated too much. You can stay in this position for the birth of your baby, if you wish. °Sometimes it might be necessary to open your legs wider than your pain-free range to deliver your baby safely, particularly if you have an assisted delivery (for example, with the vacuum or foreps). °Even in this case, it's possible to limit the separation of your legs. Make sure your midwife and doctor   are aware that you have PGP.  °If you go beyond your pain-free range, your physiotherapist should assess you after the birth.  °Take extra care until they have assessed and advised you. ° °Who gets pelvic pain in pregnancy? °It's estimated that PGP affects up to 1 in 5 pregnant women to some degree.  °It's not known exactly why pelvic pain affects some women, but it's thought to be linked to a number of issues, including previous damage to the pelvis, pelvic joints moving unevenly, and the weight or position of the baby.  °Factors that may make a woman more likely to develop PGP include:  °• a history of lower back or pelvic girdle pain  °• previous injury to the pelvis (for example, from a fall or accident) °• having PGP in a previous pregnancy  °• a physically demanding job °• being overweight °• having a multiple birth pregnancy  °healthtalk.org has interviews with women talking about their experiences of pelvic pain in pregnancy. °Read more about coping with common pregnancy problems (www.nhs.uk/conditions/pregnancy-and-baby) including nausea, heartburn, tiredness and constipation. ° °Talk with your doctor or midwife for more information. ° ° °

## 2019-08-06 NOTE — MAU Provider Note (Signed)
Chief Complaint:  Contractions   None     HPI: Amanda Melton is a 33 y.o. [phone] at [redacted]w[redacted]d who presents to maternity admissions reporting pelvic pressure, pain in her right lower abdomen, and intermittent contractions. She reports waking up at 1 am with pelvic pressure and contractions every 15 minutes. Pelvic pressure is constant and more severe than the intermittent contractions.  Moving makes the pain worse. It shoots down from her right lower abdomen into the vagina. She reports an increase in vaginal discharge without itching, burning, or odor today.  There are no other symptoms. She has not tried any treatments.  She had positive FFN on 07/31/19 in the office and received BMZ x 2 on 9/29 and 9/30.   She reports good fetal movement.  HPI  Past Medical History: Past Medical History:  Diagnosis Date  . Asthma   . Chlamydia 2005  . Environmental allergies   . Hypertension    gestational hypertension    Past obstetric history: OB History  Gravida Para Term Preterm AB Living  5 3 2 1 1 3   SAB TAB Ectopic Multiple Live Births  1 0 0 0 3    # Outcome Date GA Lbr Len/2nd Weight Sex Delivery Anes PTL Lv  5 Current           4 Term 06/21/17 [redacted]w[redacted]d 05:05 / 00:10 3010 g F Vag-Spont EPI  LIV  3 Term 07/23/12 [redacted]w[redacted]d 09:11 / 00:08 2812 g M Vag-Spont EPI, Local  LIV     Birth Comments: none  2 Preterm 05/17/07 [redacted]w[redacted]d   M Vag-Spont   LIV  1 SAB             Past Surgical History: Past Surgical History:  Procedure Laterality Date  . NO PAST SURGERIES      Family History: Family History  Problem Relation Age of Onset  . Diabetes Father   . Hypertension Father   . Kidney failure Father   . Anesthesia problems Neg Hx     Social History: Social History   Tobacco Use  . Smoking status: Never Smoker  . Smokeless tobacco: Never Used  Substance Use Topics  . Alcohol use: Not Currently    Alcohol/week: 3.0 standard drinks    Types: 3 Standard drinks or equivalent per week     Comment: occasionally  . Drug use: No    Allergies: No Known Allergies  Meds:  Medications Prior to Admission  Medication Sig Dispense Refill Last Dose  . acetaminophen (TYLENOL) 500 MG tablet Take 1,000 mg by mouth every 8 (eight) hours as needed for mild pain.   08/05/2019 at Unknown time  . albuterol (PROVENTIL HFA;VENTOLIN HFA) 108 (90 BASE) MCG/ACT inhaler Inhale 1-2 puffs into the lungs every 6 (six) hours as needed for wheezing or shortness of breath. 1 Inhaler 0 Past Month at Unknown time  . aspirin EC 81 MG tablet Take 81 mg by mouth daily.   08/05/2019 at Unknown time  . cetirizine (ZYRTEC) 10 MG tablet Take 10 mg by mouth daily.   08/05/2019 at Unknown time  . Ferrous Sulfate (IRON PO) Take by mouth.   08/05/2019 at Unknown time  . Prenatal Vit-Fe Phos-FA-Omega (VITAFOL GUMMIES) 3.33-0.333-34.8 MG CHEW Chew 3 tablets by mouth daily before breakfast. 90 tablet 11 08/05/2019 at Unknown time  . promethazine (PHENERGAN) 12.5 MG tablet Take 1 tablet (12.5 mg total) by mouth every 8 (eight) hours as needed for up to 30 doses for nausea or  vomiting. 30 tablet 0 More than a month at Unknown time    ROS:  Review of Systems  Constitutional: Negative for chills, fatigue and fever.  Eyes: Negative for visual disturbance.  Respiratory: Negative for shortness of breath.   Cardiovascular: Negative for chest pain.  Gastrointestinal: Positive for abdominal pain. Negative for nausea and vomiting.  Genitourinary: Positive for pelvic pain. Negative for difficulty urinating, dysuria, flank pain, vaginal bleeding, vaginal discharge and vaginal pain.  Neurological: Negative for dizziness and headaches.  Psychiatric/Behavioral: Negative.      I have reviewed patient's Past Medical Hx, Surgical Hx, Family Hx, Social Hx, medications and allergies.   Physical Exam   Patient Vitals for the past 24 hrs:  BP Temp Temp src Pulse SpO2 Height Weight  08/06/19 0701 124/73 - - 74 - - -  08/06/19 0646 123/84  - - 87 - - -  08/06/19 0631 124/77 - - 83 - - -  08/06/19 0616 126/71 - - 86 - - -  08/06/19 0601 137/82 - - 90 - - -  08/06/19 0559 128/82 - - 87 - - -  08/06/19 0531 132/87 - - 87 - - -  08/06/19 0516 (!) 134/91 - - 85 - - -  08/06/19 0501 127/80 - - 86 - - -  08/06/19 0446 131/80 - - 84 - - -  08/06/19 0432 (!) 136/99 - - 92 99 % - -  08/06/19 0431 (!) 136/99 - - 92 - - -  08/06/19 0423 (!) 140/94 - - 87 - - -  08/06/19 0417 139/88 98.1 F (36.7 C) Oral 80 - - -  08/06/19 0409 - - - - - 5\' 5"  (1.651 m) 115 kg   Constitutional: Well-developed, well-nourished female in no acute distress.  Cardiovascular: normal rate Respiratory: normal effort GI: Abd soft, non-tender, gravid appropriate for gestational age.  MS: Extremities nontender, no edema, normal ROM Neurologic: Alert and oriented x 4.  GU: Neg CVAT.   Dilation: 1 Effacement (%): 50 Presentation: Vertex Exam by:: Arbie CookeyLisa Leftwhich Kirby CNM   FHT:  Baseline 135 , moderate variability, accelerations present, no decelerations Contractions: Q 5-15 minutes, mild to palpation   Labs: Results for orders placed or performed during the hospital encounter of 08/06/19 (from the past 24 hour(s))  Urinalysis, Routine w reflex microscopic     Status: Abnormal   Collection Time: 08/06/19  4:23 AM  Result Value Ref Range   Color, Urine STRAW (A) YELLOW   APPearance CLEAR CLEAR   Specific Gravity, Urine 1.004 (L) 1.005 - 1.030   pH 7.0 5.0 - 8.0   Glucose, UA 50 (A) NEGATIVE mg/dL   Hgb urine dipstick NEGATIVE NEGATIVE   Bilirubin Urine NEGATIVE NEGATIVE   Ketones, ur NEGATIVE NEGATIVE mg/dL   Protein, ur NEGATIVE NEGATIVE mg/dL   Nitrite NEGATIVE NEGATIVE   Leukocytes,Ua NEGATIVE NEGATIVE  Protein / creatinine ratio, urine     Status: None   Collection Time: 08/06/19  4:23 AM  Result Value Ref Range   Creatinine, Urine 24.30 mg/dL   Total Protein, Urine <6 mg/dL   Protein Creatinine Ratio        0.00 - 0.15 mg/mg[Cre]  CBC      Status: Abnormal   Collection Time: 08/06/19  5:44 AM  Result Value Ref Range   WBC 11.3 (H) 4.0 - 10.5 K/uL   RBC 2.95 (L) 3.87 - 5.11 MIL/uL   Hemoglobin 11.0 (L) 12.0 - 15.0 g/dL   HCT 11.929.8 (L)  36.0 - 46.0 %   MCV 101.0 (H) 80.0 - 100.0 fL   MCH 37.3 (H) 26.0 - 34.0 pg   MCHC 36.9 (H) 30.0 - 36.0 g/dL   RDW 09.8 11.9 - 14.7 %   Platelets 217 150 - 400 K/uL   nRBC 0.3 (H) 0.0 - 0.2 %  Comprehensive metabolic panel     Status: Abnormal   Collection Time: 08/06/19  5:44 AM  Result Value Ref Range   Sodium 136 135 - 145 mmol/L   Potassium 3.8 3.5 - 5.1 mmol/L   Chloride 105 98 - 111 mmol/L   CO2 23 22 - 32 mmol/L   Glucose, Bld 129 (H) 70 - 99 mg/dL   BUN <5 (L) 6 - 20 mg/dL   Creatinine, Ser 8.29 0.44 - 1.00 mg/dL   Calcium 8.6 (L) 8.9 - 10.3 mg/dL   Total Protein 5.7 (L) 6.5 - 8.1 g/dL   Albumin 2.4 (L) 3.5 - 5.0 g/dL   AST 14 (L) 15 - 41 U/L   ALT 13 0 - 44 U/L   Alkaline Phosphatase 56 38 - 126 U/L   Total Bilirubin 0.3 0.3 - 1.2 mg/dL   GFR calc non Af Amer >60 >60 mL/min   GFR calc Af Amer >60 >60 mL/min   Anion gap 8 5 - 15      Imaging:  No results found.  MAU Course/MDM: Orders Placed This Encounter  Procedures  . Urinalysis, Routine w reflex microscopic  . CBC  . Comprehensive metabolic panel  . Protein / creatinine ratio, urine  . Airborne and Contact precautions  . Discharge patient    Meds ordered this encounter  Medications  . cyclobenzaprine (FLEXERIL) tablet 10 mg  . cyclobenzaprine (FLEXERIL) 10 MG tablet    Sig: Take 0.5-1 tablets (5-10 mg total) by mouth 2 (two) times daily as needed for muscle spasms.    Dispense:  20 tablet    Refill:  0    Order Specific Question:   Supervising Provider    Answer:   Nicholaus Bloom C [[id number]]     NST reviewed and reactive Cervix unchanged in 3 hours in MAU so no evidence of preterm labor Pt pain most significant when repositioning in bed for cervical exam, likely pelvic/pubic symphysis pain Flexeril 10  mg PO given in MAU with good resolution of pain D/C home with close outpatient follow up Return to MAU as needed for emergencies   Assessment: 1. Hypertension affecting pregnancy in third trimester   2. Pelvic pain affecting pregnancy in third trimester, antepartum     Plan: Discharge home Labor precautions and fetal kick counts Follow-up Information    [facility] Follow up.   Why: Follow up this week in the office, return to MAU as needed for emergencies. Contact information: [address] [place] [address] [phone]       [facility]   Specialty: Nephrology Contact information: [address] [place] [address] [phone]          Allergies as of 08/06/2019   No Known Allergies     Medication List    TAKE these medications   acetaminophen 500 MG tablet Commonly known as: TYLENOL Take 1,000 mg by mouth every 8 (eight) hours as needed for mild pain.   albuterol 108 (90 Base) MCG/ACT inhaler Commonly known as: VENTOLIN HFA Inhale 1-2 puffs into the lungs every 6 (six) hours as  needed for wheezing or shortness of breath.   aspirin EC 81 MG tablet Take 81 mg by mouth daily.   cetirizine 10 MG tablet Commonly known as: ZYRTEC Take 10 mg by mouth daily.   cyclobenzaprine 10 MG tablet Commonly known as: FLEXERIL Take 0.5-1 tablets (5-10 mg total) by mouth 2 (two) times daily as needed for muscle spasms.   IRON PO Take by mouth.   promethazine 12.5 MG tablet Commonly known as: PHENERGAN Take 1 tablet (12.5 mg total) by mouth every 8 (eight) hours as needed for up to 30 doses for nausea or vomiting.   Vitafol Gummies 3.33-0.333-34.8 MG Chew Chew 3 tablets by mouth daily before breakfast.       Fatima Blank Certified Nurse-Midwife 08/06/2019 7:24 AM

## 2019-08-18 DIAGNOSIS — O24419 Gestational diabetes mellitus in pregnancy, unspecified control: Secondary | ICD-10-CM | POA: Diagnosis not present

## 2019-08-18 DIAGNOSIS — O99213 Obesity complicating pregnancy, third trimester: Secondary | ICD-10-CM | POA: Diagnosis not present

## 2019-08-18 DIAGNOSIS — Z3A33 33 weeks gestation of pregnancy: Secondary | ICD-10-CM | POA: Diagnosis not present

## 2019-08-18 DIAGNOSIS — Z3492 Encounter for supervision of normal pregnancy, unspecified, second trimester: Secondary | ICD-10-CM | POA: Diagnosis not present

## 2019-08-18 DIAGNOSIS — Z3493 Encounter for supervision of normal pregnancy, unspecified, third trimester: Secondary | ICD-10-CM | POA: Diagnosis not present

## 2019-08-24 DIAGNOSIS — Z3A33 33 weeks gestation of pregnancy: Secondary | ICD-10-CM | POA: Diagnosis not present

## 2019-08-24 DIAGNOSIS — O24419 Gestational diabetes mellitus in pregnancy, unspecified control: Secondary | ICD-10-CM | POA: Diagnosis not present

## 2019-08-24 DIAGNOSIS — O24410 Gestational diabetes mellitus in pregnancy, diet controlled: Secondary | ICD-10-CM | POA: Diagnosis not present

## 2019-08-24 DIAGNOSIS — O479 False labor, unspecified: Secondary | ICD-10-CM | POA: Diagnosis not present

## 2019-08-24 DIAGNOSIS — Z9189 Other specified personal risk factors, not elsewhere classified: Secondary | ICD-10-CM | POA: Diagnosis not present

## 2019-08-24 DIAGNOSIS — O99213 Obesity complicating pregnancy, third trimester: Secondary | ICD-10-CM | POA: Diagnosis not present

## 2019-09-04 ENCOUNTER — Inpatient Hospital Stay (HOSPITAL_COMMUNITY)
Admission: AD | Admit: 2019-09-04 | Discharge: 2019-09-04 | Disposition: A | Discharge status: Home / Self Care | Payer: Medicaid Other | Attending: Obstetrics and Gynecology | Admitting: Obstetrics and Gynecology

## 2019-09-04 ENCOUNTER — Other Ambulatory Visit: Payer: Self-pay

## 2019-09-04 ENCOUNTER — Encounter (HOSPITAL_COMMUNITY): Payer: Self-pay

## 2019-09-04 DIAGNOSIS — Z79899 Other long term (current) drug therapy: Secondary | ICD-10-CM | POA: Diagnosis not present

## 2019-09-04 DIAGNOSIS — J45909 Unspecified asthma, uncomplicated: Secondary | ICD-10-CM | POA: Insufficient documentation

## 2019-09-04 DIAGNOSIS — O99513 Diseases of the respiratory system complicating pregnancy, third trimester: Secondary | ICD-10-CM | POA: Diagnosis not present

## 2019-09-04 DIAGNOSIS — O24410 Gestational diabetes mellitus in pregnancy, diet controlled: Secondary | ICD-10-CM | POA: Insufficient documentation

## 2019-09-04 DIAGNOSIS — Z7982 Long term (current) use of aspirin: Secondary | ICD-10-CM | POA: Insufficient documentation

## 2019-09-04 DIAGNOSIS — Z8249 Family history of ischemic heart disease and other diseases of the circulatory system: Secondary | ICD-10-CM | POA: Insufficient documentation

## 2019-09-04 DIAGNOSIS — O479 False labor, unspecified: Secondary | ICD-10-CM | POA: Diagnosis not present

## 2019-09-04 DIAGNOSIS — Z833 Family history of diabetes mellitus: Secondary | ICD-10-CM | POA: Insufficient documentation

## 2019-09-04 DIAGNOSIS — Z3A35 35 weeks gestation of pregnancy: Secondary | ICD-10-CM | POA: Diagnosis not present

## 2019-09-04 DIAGNOSIS — Z0371 Encounter for suspected problem with amniotic cavity and membrane ruled out: Secondary | ICD-10-CM | POA: Diagnosis not present

## 2019-09-04 DIAGNOSIS — O4703 False labor before 37 completed weeks of gestation, third trimester: Secondary | ICD-10-CM | POA: Diagnosis not present

## 2019-09-04 DIAGNOSIS — Z3689 Encounter for other specified antenatal screening: Secondary | ICD-10-CM

## 2019-09-04 DIAGNOSIS — O139 Gestational [pregnancy-induced] hypertension without significant proteinuria, unspecified trimester: Secondary | ICD-10-CM

## 2019-09-04 DIAGNOSIS — O133 Gestational [pregnancy-induced] hypertension without significant proteinuria, third trimester: Secondary | ICD-10-CM | POA: Diagnosis not present

## 2019-09-04 LAB — COMPREHENSIVE METABOLIC PANEL
ALT: 13 U/L (ref 0–44)
AST: 13 U/L — ABNORMAL LOW (ref 15–41)
Albumin: 2.3 g/dL — ABNORMAL LOW (ref 3.5–5.0)
Alkaline Phosphatase: 78 U/L (ref 38–126)
Anion gap: 10 (ref 5–15)
BUN: <5 mg/dL — ABNORMAL LOW (ref 6–20)
CO2: 21 mmol/L — ABNORMAL LOW (ref 22–32)
Calcium: 9 mg/dL (ref 8.9–10.3)
Chloride: 105 mmol/L (ref 98–111)
Creatinine, Ser: 0.49 mg/dL (ref 0.44–1.00)
GFR calc Af Amer: >60 mL/min (ref >60)
GFR calc non Af Amer: >60 mL/min (ref >60)
Glucose, Bld: 88 mg/dL (ref 70–99)
Potassium: 3.9 mmol/L (ref 3.5–5.1)
Sodium: 136 mmol/L (ref 135–145)
Total Bilirubin: 0.4 mg/dL (ref 0.3–1.2)
Total Protein: 5.8 g/dL — ABNORMAL LOW (ref 6.5–8.1)

## 2019-09-04 LAB — CBC
HCT: 33 % — ABNORMAL LOW (ref 36.0–46.0)
Hemoglobin: 11.8 g/dL — ABNORMAL LOW (ref 12.0–15.0)
MCH: 32.7 pg (ref 26.0–34.0)
MCHC: 35.8 g/dL (ref 30.0–36.0)
MCV: 91.4 fL (ref 80.0–100.0)
Platelets: 216 10*3/uL (ref 150–400)
RBC: 3.61 MIL/uL — ABNORMAL LOW (ref 3.87–5.11)
RDW: 13.2 % (ref 11.5–15.5)
WBC: 7.7 10*3/uL (ref 4.0–10.5)
nRBC: 0 % (ref 0.0–0.2)

## 2019-09-04 LAB — AMNISURE RUPTURE OF MEMBRANE (ROM) NOT AT ARMC: Amnisure ROM: NEGATIVE

## 2019-09-04 LAB — URINALYSIS, ROUTINE W REFLEX MICROSCOPIC
Bilirubin Urine: NEGATIVE
Glucose, UA: NEGATIVE mg/dL
Hgb urine dipstick: NEGATIVE
Ketones, ur: NEGATIVE mg/dL
Leukocytes,Ua: NEGATIVE
Nitrite: NEGATIVE
Protein, ur: NEGATIVE mg/dL
Specific Gravity, Urine: 1.017 (ref 1.005–1.030)
pH: 6 (ref 5.0–8.0)

## 2019-09-04 LAB — PROTEIN / CREATININE RATIO, URINE
Creatinine, Urine: 91.52 mg/dL
Protein Creatinine Ratio: 0.16 mg/mg{Cre} — ABNORMAL HIGH (ref 0.00–0.15)
Total Protein, Urine: 15 mg/dL

## 2019-09-04 LAB — GLUCOSE, CAPILLARY: Glucose-Capillary: 89 mg/dL (ref 70–99)

## 2019-09-04 NOTE — MAU Provider Note (Signed)
History     CSN: [id number]  Arrival date and time: 09/04/19 0941   First Provider Initiated Contact with Patient 09/04/19 1023      Chief Complaint  Patient presents with  . Back Pain  . Pelvic Pain  . Rupture of Membranes   HPI Amanda Melton 33 y.o. [id number]  Comes to MAU today with pain in her back, thighs and right side that radiates down to her pelvis.  Is having trouble walking and moving around.  Has used a heating pad on her back but that is not helping.  Had leaking on yesterday.  Had leaking earlier in the week and a speculum exam was done in the office on Wednesday - said her cervix could not be visualized with the speculum as it was to the side.  States all of her babies have been born before 37 weeks.  She has a history in this pregnancy of elevated BPs with gestational hypertension and gestational diabetes.  Has not eaten breakfast today and did not check her glucose level at home.  Is seen in the office weekly on Wednesdays.   OB History    Gravida  5   Para  3   Term  2   Preterm  1   AB  1   Living  3     SAB  1   TAB  0   Ectopic  0   Multiple  0   Live Births  3           Past Medical History:  Diagnosis Date  . Asthma   . Chlamydia 2005  . Environmental allergies   . Hypertension    gestational hypertension    Past Surgical History:  Procedure Laterality Date  . NO PAST SURGERIES      Family History  Problem Relation Age of Onset  . Diabetes Father   . Hypertension Father   . Kidney failure Father   . Anesthesia problems Neg Hx     Social History   Tobacco Use  . Smoking status: Never Smoker  . Smokeless tobacco: Never Used  Substance Use Topics  . Alcohol use: Not Currently    Alcohol/week: 3.0 standard drinks    Types: 3 Standard drinks or equivalent per week    Comment: occasionally  . Drug use: No    Allergies: No Known Allergies  Medications Prior to Admission  Medication Sig Dispense Refill Last Dose  .  acetaminophen (TYLENOL) 500 MG tablet Take 1,000 mg by mouth every 8 (eight) hours as needed for mild pain.     Marland Kitchen. albuterol (PROVENTIL HFA;VENTOLIN HFA) 108 (90 BASE) MCG/ACT inhaler Inhale 1-2 puffs into the lungs every 6 (six) hours as needed for wheezing or shortness of breath. 1 Inhaler 0   . aspirin EC 81 MG tablet Take 81 mg by mouth daily.     . cetirizine (ZYRTEC) 10 MG tablet Take 10 mg by mouth daily.     . cyclobenzaprine (FLEXERIL) 10 MG tablet Take 0.5-1 tablets (5-10 mg total) by mouth 2 (two) times daily as needed for muscle spasms. 20 tablet 0   . Ferrous Sulfate (IRON PO) Take by mouth.     . Prenatal Vit-Fe Phos-FA-Omega (VITAFOL GUMMIES) 3.33-0.333-34.8 MG CHEW Chew 3 tablets by mouth daily before breakfast. 90 tablet 11   . promethazine (PHENERGAN) 12.5 MG tablet Take 1 tablet (12.5 mg total) by mouth every 8 (eight) hours as needed for up to 30 doses for  nausea or vomiting. 30 tablet 0     Review of Systems  Constitutional: Negative for fever.  Respiratory: Negative for cough and shortness of breath.   Gastrointestinal: Positive for abdominal pain.  Genitourinary: Negative for dysuria, flank pain and vaginal bleeding.       Leaking noted on yesterday.  Musculoskeletal: Positive for back pain.   Physical Exam   Blood pressure (!) 142/89, pulse 92, temperature 98.4 F (36.9 C), temperature source Oral, resp. rate 17, height 5\' 5"  (1.651 m), weight 120.3 kg, last menstrual period 01/15/2019, SpO2 99 %.  Physical Exam  Nursing note and vitals reviewed. Constitutional: She is oriented to person, place, and time. She appears well-developed and well-nourished.  HENT:  Head: Normocephalic.  Eyes: EOM are normal.  Neck: Neck supple.  GI: Soft. There is no abdominal tenderness.  Musculoskeletal: Normal range of motion.  Neurological: She is alert and oriented to person, place, and time.  Skin: Skin is warm and dry.  Psychiatric: She has a normal mood and affect.    Vitals:   09/04/19 1047 09/04/19 1102 09/04/19 1117 09/04/19 1132  BP: (!) 147/81 (!) 157/89 (!) 148/95 (!) 145/66  Pulse: 84 83 90 87  Resp:      Temp:      TempSrc:      SpO2:      Weight:      Height:       MAU Course  Procedures Labs Results for orders placed or performed during the hospital encounter of 09/04/19 (from the past 24 hour(s))  Urinalysis, Routine w reflex microscopic     Status: None   Collection Time: 09/04/19 10:27 AM  Result Value Ref Range   Color, Urine YELLOW YELLOW   APPearance CLEAR CLEAR   Specific Gravity, Urine 1.017 1.005 - 1.030   pH 6.0 5.0 - 8.0   Glucose, UA NEGATIVE NEGATIVE mg/dL   Hgb urine dipstick NEGATIVE NEGATIVE   Bilirubin Urine NEGATIVE NEGATIVE   Ketones, ur NEGATIVE NEGATIVE mg/dL   Protein, ur NEGATIVE NEGATIVE mg/dL   Nitrite NEGATIVE NEGATIVE   Leukocytes,Ua NEGATIVE NEGATIVE  Protein / creatinine ratio, urine     Status: Abnormal   Collection Time: 09/04/19 10:27 AM  Result Value Ref Range   Creatinine, Urine 91.52 mg/dL   Total Protein, Urine 15 mg/dL   Protein Creatinine Ratio 0.16 (H) 0.00 - 0.15 mg/mg[Cre]  Amnisure rupture of membrane (rom)not at [facility]     Status: None   Collection Time: 09/04/19 10:41 AM  Result Value Ref Range   Amnisure ROM NEGATIVE   CBC     Status: Abnormal   Collection Time: 09/04/19 10:48 AM  Result Value Ref Range   WBC 7.7 4.0 - 10.5 K/uL   RBC 3.61 (L) 3.87 - 5.11 MIL/uL   Hemoglobin 11.8 (L) 12.0 - 15.0 g/dL   HCT 33.0 (L) 36.0 - 46.0 %   MCV 91.4 80.0 - 100.0 fL   MCH 32.7 26.0 - 34.0 pg   MCHC 35.8 30.0 - 36.0 g/dL   RDW 13.2 11.5 - 15.5 %   Platelets 216 150 - 400 K/uL   nRBC 0.0 0.0 - 0.2 %  Comprehensive metabolic panel     Status: Abnormal   Collection Time: 09/04/19 10:48 AM  Result Value Ref Range   Sodium 136 135 - 145 mmol/L   Potassium 3.9 3.5 - 5.1 mmol/L   Chloride 105 98 - 111 mmol/L   CO2 21 (L) 22 - 32 mmol/L  Glucose, Bld 88 70 - 99 mg/dL   BUN <5 (L) 6 -  20 mg/dL   Creatinine, Ser 9.14 0.44 - 1.00 mg/dL   Calcium 9.0 8.9 - 78.2 mg/dL   Total Protein 5.8 (L) 6.5 - 8.1 g/dL   Albumin 2.3 (L) 3.5 - 5.0 g/dL   AST 13 (L) 15 - 41 U/L   ALT 13 0 - 44 U/L   Alkaline Phosphatase 78 38 - 126 U/L   Total Bilirubin 0.4 0.3 - 1.2 mg/dL   GFR calc non Af Amer >60 >60 mL/min   GFR calc Af Amer >60 >60 mL/min   Anion gap 10 5 - 15  Glucose, capillary     Status: None   Collection Time: 09/04/19 10:56 AM  Result Value Ref Range   Glucose-Capillary 89 70 - 99 mg/dL    MDM History of gestational hypertension - BPs "up and down" in the office per client.  No severe range pressures today and no evidence of preeclampsia on labs today.  Is on baby Aspirin.  Had betamethasone earlier in this pregnancy.  BPs higher here today than she has had in the office however there are no severe range BPs today. Client has weekly BPPs in the office - last on 08-31-19 was 8/8.  Has another appointment scheduled on 09-07-19.  Is feeling the baby move well. Has had contractions on and off today irregular in time and in intensity.  Cervix in office on 08-31-19 was 1 cm and 50%. Observed for approx 3 hours.  Cervix did not change although contractions were irregular with varying intensity.  Discussed with client that she has changed her cervix since her last exam on Wednesday but has not changed her cervix with the contractions she has had today.  Advised she may be back later today or even next week. NST - FHT baseline 135 with moderate variability, 15x15 accels noted especially after client ate crackers and had gingerale, contractions 3-9 minutes apart, no decelerations - reactive NST  Assessment and Plan  Threatened labor Encounter for evaluation of ROM - no evidence of ROM Diet controlled gestational diabetes Gestational hypertension with no lab evidence of preeclampsia - no severe range pressures  Reactive NST   Plan Return if you contractions are worsening or if you  have leaking of fluid or vaginal bleeding. Keep your appointment in the office. Call if you develop headache, visual changes, worsening edema or RUQ pain.  Amanda Melton 09/04/2019, 10:33 AM

## 2019-09-04 NOTE — MAU Note (Signed)
Amanda Melton is a 33 y.o. at [redacted]w[redacted]d here in MAU reporting: since last night she has been having a lot of pelvic, back, and hip pain. Reports watery fluid since yesterday, no bleeding. States she has continued to leak some fluid, it is clear. +FM  Onset of complaint: last night  Pain score: pelvic pain 10/10, back pain 10/10  Vitals:   09/04/19 0957  BP: (!) 136/92  Pulse: 97  Resp: 17  Temp: 98.4 F (36.9 C)  SpO2: 99%     FHT: +FM  Lab orders placed from triage: UA

## 2019-09-04 NOTE — Discharge Instructions (Signed)
Return if you contractions are worsening or if you have leaking of fluid or vaginal bleeding. Keep your appointment in the office. Call if you develop headache, visual changes, worsening edema or RUQ pain.

## 2019-09-07 DIAGNOSIS — Z3A35 35 weeks gestation of pregnancy: Secondary | ICD-10-CM | POA: Diagnosis not present

## 2019-09-07 DIAGNOSIS — O24419 Gestational diabetes mellitus in pregnancy, unspecified control: Secondary | ICD-10-CM | POA: Diagnosis not present

## 2019-09-07 DIAGNOSIS — Z3402 Encounter for supervision of normal first pregnancy, second trimester: Secondary | ICD-10-CM | POA: Diagnosis not present

## 2019-09-14 ENCOUNTER — Other Ambulatory Visit: Payer: Self-pay

## 2019-09-14 ENCOUNTER — Encounter (HOSPITAL_COMMUNITY): Payer: Self-pay

## 2019-09-14 ENCOUNTER — Inpatient Hospital Stay (HOSPITAL_COMMUNITY)
Admission: AD | Admit: 2019-09-14 | Discharge: 2019-09-16 | DRG: 798 | Disposition: A | Discharge status: Home / Self Care | Payer: Commercial Managed Care - PPO | Attending: Obstetrics & Gynecology | Admitting: Obstetrics & Gynecology

## 2019-09-14 DIAGNOSIS — Z3A36 36 weeks gestation of pregnancy: Secondary | ICD-10-CM | POA: Diagnosis not present

## 2019-09-14 DIAGNOSIS — O24425 Gestational diabetes mellitus in childbirth, controlled by oral hypoglycemic drugs: Secondary | ICD-10-CM | POA: Diagnosis present

## 2019-09-14 DIAGNOSIS — E669 Obesity, unspecified: Secondary | ICD-10-CM | POA: Diagnosis present

## 2019-09-14 DIAGNOSIS — Z20828 Contact with and (suspected) exposure to other viral communicable diseases: Secondary | ICD-10-CM | POA: Diagnosis present

## 2019-09-14 DIAGNOSIS — Z302 Encounter for sterilization: Secondary | ICD-10-CM | POA: Diagnosis not present

## 2019-09-14 DIAGNOSIS — O1493 Unspecified pre-eclampsia, third trimester: Secondary | ICD-10-CM | POA: Diagnosis not present

## 2019-09-14 DIAGNOSIS — O1414 Severe pre-eclampsia complicating childbirth: Principal | ICD-10-CM | POA: Diagnosis present

## 2019-09-14 DIAGNOSIS — O24419 Gestational diabetes mellitus in pregnancy, unspecified control: Secondary | ICD-10-CM | POA: Diagnosis not present

## 2019-09-14 DIAGNOSIS — Z8759 Personal history of other complications of pregnancy, childbirth and the puerperium: Secondary | ICD-10-CM | POA: Diagnosis not present

## 2019-09-14 DIAGNOSIS — O99214 Obesity complicating childbirth: Secondary | ICD-10-CM | POA: Diagnosis present

## 2019-09-14 DIAGNOSIS — Z3A37 37 weeks gestation of pregnancy: Secondary | ICD-10-CM | POA: Diagnosis not present

## 2019-09-14 DIAGNOSIS — O1490 Unspecified pre-eclampsia, unspecified trimester: Secondary | ICD-10-CM | POA: Diagnosis present

## 2019-09-14 DIAGNOSIS — Z3689 Encounter for other specified antenatal screening: Secondary | ICD-10-CM

## 2019-09-14 DIAGNOSIS — O26892 Other specified pregnancy related conditions, second trimester: Secondary | ICD-10-CM | POA: Diagnosis present

## 2019-09-14 DIAGNOSIS — O1403 Mild to moderate pre-eclampsia, third trimester: Secondary | ICD-10-CM | POA: Diagnosis not present

## 2019-09-14 LAB — URINALYSIS, ROUTINE W REFLEX MICROSCOPIC
Bilirubin Urine: NEGATIVE
Glucose, UA: NEGATIVE mg/dL
Hgb urine dipstick: NEGATIVE
Ketones, ur: 5 mg/dL — AB
Nitrite: NEGATIVE
Protein, ur: NEGATIVE mg/dL
Specific Gravity, Urine: 1.014 (ref 1.005–1.030)
pH: 6 (ref 5.0–8.0)

## 2019-09-14 LAB — COMPREHENSIVE METABOLIC PANEL
ALT: 13 U/L (ref 0–44)
AST: 20 U/L (ref 15–41)
Albumin: 2.3 g/dL — ABNORMAL LOW (ref 3.5–5.0)
Alkaline Phosphatase: 96 U/L (ref 38–126)
Anion gap: 11 (ref 5–15)
BUN: <5 mg/dL — ABNORMAL LOW (ref 6–20)
CO2: 19 mmol/L — ABNORMAL LOW (ref 22–32)
Calcium: 9.3 mg/dL (ref 8.9–10.3)
Chloride: 105 mmol/L (ref 98–111)
Creatinine, Ser: 0.53 mg/dL (ref 0.44–1.00)
GFR calc Af Amer: >60 mL/min (ref >60)
GFR calc non Af Amer: >60 mL/min (ref >60)
Glucose, Bld: 148 mg/dL — ABNORMAL HIGH (ref 70–99)
Potassium: 3.8 mmol/L (ref 3.5–5.1)
Sodium: 135 mmol/L (ref 135–145)
Total Bilirubin: 0.6 mg/dL (ref 0.3–1.2)
Total Protein: 5.9 g/dL — ABNORMAL LOW (ref 6.5–8.1)

## 2019-09-14 LAB — RAPID URINE DRUG SCREEN, HOSP PERFORMED
Amphetamines: NOT DETECTED
Barbiturates: NOT DETECTED
Benzodiazepines: NOT DETECTED
Cocaine: NOT DETECTED
Opiates: NOT DETECTED
Tetrahydrocannabinol: NOT DETECTED

## 2019-09-14 LAB — CBC
HCT: 34.8 % — ABNORMAL LOW (ref 36.0–46.0)
Hemoglobin: 11.8 g/dL — ABNORMAL LOW (ref 12.0–15.0)
MCH: 31.4 pg (ref 26.0–34.0)
MCHC: 33.9 g/dL (ref 30.0–36.0)
MCV: 92.6 fL (ref 80.0–100.0)
Platelets: 235 10*3/uL (ref 150–400)
RBC: 3.76 MIL/uL — ABNORMAL LOW (ref 3.87–5.11)
RDW: 13.2 % (ref 11.5–15.5)
WBC: 8.2 10*3/uL (ref 4.0–10.5)
nRBC: 0 % (ref 0.0–0.2)

## 2019-09-14 LAB — SARS CORONAVIRUS 2 BY RT PCR (HOSPITAL ORDER, PERFORMED IN ~~LOC~~ HOSPITAL LAB): SARS Coronavirus 2: NEGATIVE

## 2019-09-14 LAB — TYPE AND SCREEN
ABO/RH(D): B POS
Antibody Screen: NEGATIVE

## 2019-09-14 LAB — ABO/RH: ABO/RH(D): B POS

## 2019-09-14 LAB — GLUCOSE, CAPILLARY: Glucose-Capillary: 104 mg/dL — ABNORMAL HIGH (ref 70–99)

## 2019-09-14 LAB — PROTEIN / CREATININE RATIO, URINE
Creatinine, Urine: 57.17 mg/dL
Protein Creatinine Ratio: 0.33 mg/mg{Cre} — ABNORMAL HIGH (ref 0.00–0.15)
Total Protein, Urine: 19 mg/dL

## 2019-09-14 MED ORDER — OXYCODONE-ACETAMINOPHEN 5-325 MG PO TABS: 2.0000 | ORAL_TABLET | ORAL | Status: DC | PRN

## 2019-09-14 MED ORDER — TERBUTALINE SULFATE 1 MG/ML IJ SOLN: 0.2500 mg | Freq: Once | INTRAMUSCULAR | Status: DC | PRN

## 2019-09-14 MED ORDER — OXYTOCIN BOLUS FROM INFUSION
500.0000 mL | Freq: Once | INTRAVENOUS | Status: AC
  Administered 2019-09-15: 500 mL via INTRAVENOUS

## 2019-09-14 MED ORDER — ACETAMINOPHEN 325 MG PO TABS: 650.0000 mg | ORAL_TABLET | ORAL | Status: DC | PRN

## 2019-09-14 MED ORDER — DIPHENHYDRAMINE HCL 50 MG/ML IJ SOLN: 12.5000 mg | INTRAMUSCULAR | Status: DC | PRN

## 2019-09-14 MED ORDER — SOD CITRATE-CITRIC ACID 500-334 MG/5ML PO SOLN: 30.0000 mL | ORAL | Status: DC | PRN

## 2019-09-14 MED ORDER — OXYTOCIN 40 UNITS IN NORMAL SALINE INFUSION - SIMPLE MED
2.5000 [IU]/h | INTRAVENOUS | Status: DC
  Administered 2019-09-15: 02:00:00 2.5 [IU]/h via INTRAVENOUS

## 2019-09-14 MED ORDER — LACTATED RINGERS IV SOLN
INTRAVENOUS | Status: DC
  Administered 2019-09-14: 21:00:00 via INTRAVENOUS

## 2019-09-14 MED ORDER — FENTANYL CITRATE (PF) 100 MCG/2ML IJ SOLN
100.0000 ug | INTRAMUSCULAR | Status: DC | PRN
  Administered 2019-09-15: 100 ug via INTRAVENOUS
  Filled 2019-09-14: qty 2

## 2019-09-14 MED ORDER — LACTATED RINGERS IV SOLN: 500.0000 mL | Freq: Once | INTRAVENOUS | Status: DC

## 2019-09-14 MED ORDER — LABETALOL HCL 5 MG/ML IV SOLN: 80.0000 mg | INTRAVENOUS | Status: DC | PRN

## 2019-09-14 MED ORDER — HYDRALAZINE HCL 20 MG/ML IJ SOLN: 10.0000 mg | INTRAMUSCULAR | Status: DC | PRN

## 2019-09-14 MED ORDER — FENTANYL-BUPIVACAINE-NACL 0.5-0.125-0.9 MG/250ML-% EP SOLN: 12.0000 mL/h | EPIDURAL | Status: DC | PRN

## 2019-09-14 MED ORDER — OXYTOCIN 40 UNITS IN NORMAL SALINE INFUSION - SIMPLE MED
1.0000 m[IU]/min | INTRAVENOUS | Status: DC
  Administered 2019-09-14: 2 m[IU]/min via INTRAVENOUS
  Filled 2019-09-14: qty 1000

## 2019-09-14 MED ORDER — LABETALOL HCL 5 MG/ML IV SOLN
40.0000 mg | INTRAVENOUS | Status: DC | PRN
  Administered 2019-09-14: 19:00:00 40 mg via INTRAVENOUS
  Filled 2019-09-14: qty 8

## 2019-09-14 MED ORDER — LIDOCAINE HCL (PF) 1 % IJ SOLN
30.0000 mL | INTRAMUSCULAR | Status: AC | PRN
  Administered 2019-09-15: 30 mL via SUBCUTANEOUS
  Filled 2019-09-14: qty 30

## 2019-09-14 MED ORDER — EPHEDRINE 5 MG/ML INJ: 10.0000 mg | INTRAVENOUS | Status: DC | PRN

## 2019-09-14 MED ORDER — OXYCODONE-ACETAMINOPHEN 5-325 MG PO TABS: 1.0000 | ORAL_TABLET | ORAL | Status: DC | PRN

## 2019-09-14 MED ORDER — PHENYLEPHRINE 40 MCG/ML (10ML) SYRINGE FOR IV PUSH (FOR BLOOD PRESSURE SUPPORT): 80.0000 ug | PREFILLED_SYRINGE | INTRAVENOUS | Status: DC | PRN

## 2019-09-14 MED ORDER — LACTATED RINGERS IV SOLN: 500.0000 mL | INTRAVENOUS | Status: DC | PRN

## 2019-09-14 MED ORDER — ONDANSETRON HCL 4 MG/2ML IJ SOLN: 4.0000 mg | Freq: Four times a day (QID) | INTRAMUSCULAR | Status: DC | PRN

## 2019-09-14 MED ORDER — LABETALOL HCL 5 MG/ML IV SOLN
20.0000 mg | INTRAVENOUS | Status: DC | PRN
  Administered 2019-09-14: 20 mg via INTRAVENOUS
  Filled 2019-09-14: qty 4

## 2019-09-14 NOTE — MAU Provider Note (Signed)
History     CSN: [id number]  Arrival date and time: 09/14/19 1719   First Provider Initiated Contact with Patient 09/14/19 1812      Chief Complaint  Patient presents with  . Contractions   Amanda Melton is a 33 y.o. [phone] at 5137w6d who presents today for labor evaluation. While here she was noted to have severe range blood pressure. She denies CHTN, but states that her blood pressure is up and down. She has GDM and is on metformin. She was sen in the office today, and blood glucoses were normal and blood pressure was normal at that time. She denies any HA, visual disturbance or RUQ pain. She states she started having contractions around 1600 q 5 mins. She states that they are still the same. She rates her contractions 10/10 for pain. She denies any VB or LOF. She reports normal fetal movement.    OB History    Gravida  5   Para  3   Term  2   Preterm  1   AB  1   Living  3     SAB  1   TAB  0   Ectopic  0   Multiple  0   Live Births  3           Past Medical History:  Diagnosis Date  . Asthma   . Chlamydia 2005  . Environmental allergies   . Hypertension    gestational hypertension    Past Surgical History:  Procedure Laterality Date  . NO PAST SURGERIES      Family History  Problem Relation Age of Onset  . Diabetes Father   . Hypertension Father   . Kidney failure Father   . Anesthesia problems Neg Hx     Social History   Tobacco Use  . Smoking status: Never Smoker  . Smokeless tobacco: Never Used  Substance Use Topics  . Alcohol use: Not Currently    Alcohol/week: 3.0 standard drinks    Types: 3 Standard drinks or equivalent per week    Comment: occasionally  . Drug use: No    Allergies: No Known Allergies  Medications Prior to Admission  Medication Sig Dispense Refill Last Dose  . acetaminophen (TYLENOL) 500 MG tablet Take 1,000 mg by mouth every 8 (eight) hours as needed for mild pain.     Marland Kitchen. albuterol (PROVENTIL HFA;VENTOLIN  HFA) 108 (90 BASE) MCG/ACT inhaler Inhale 1-2 puffs into the lungs every 6 (six) hours as needed for wheezing or shortness of breath. 1 Inhaler 0   . aspirin EC 81 MG tablet Take 81 mg by mouth daily.     . cetirizine (ZYRTEC) 10 MG tablet Take 10 mg by mouth daily.     . cyclobenzaprine (FLEXERIL) 10 MG tablet Take 0.5-1 tablets (5-10 mg total) by mouth 2 (two) times daily as needed for muscle spasms. 20 tablet 0   . Ferrous Sulfate (IRON PO) Take by mouth.     . Prenatal Vit-Fe Phos-FA-Omega (VITAFOL GUMMIES) 3.33-0.333-34.8 MG CHEW Chew 3 tablets by mouth daily before breakfast. 90 tablet 11   . promethazine (PHENERGAN) 12.5 MG tablet Take 1 tablet (12.5 mg total) by mouth every 8 (eight) hours as needed for up to 30 doses for nausea or vomiting. 30 tablet 0     Review of Systems  Constitutional: Negative for chills and fever.  Gastrointestinal: Negative for nausea and vomiting.  Genitourinary: Positive for pelvic pain. Negative for dysuria, frequency, vaginal  bleeding and vaginal discharge.   Physical Exam   Blood pressure (!) 156/91, pulse (!) 112, temperature 98.2 F (36.8 C), temperature source Oral, resp. rate 20, height 5\' 5"  (1.651 m), weight 121.8 kg, last menstrual period 01/15/2019, SpO2 99 %.  Physical Exam  Nursing note and vitals reviewed. Constitutional: She is oriented to person, place, and time. She appears well-developed and well-nourished. No distress.  HENT:  Head: Normocephalic.  Cardiovascular: Normal rate.  Respiratory: Effort normal.  GI: Soft. There is no abdominal tenderness. There is no rebound.  Neurological: She is alert and oriented to person, place, and time. She has normal reflexes. Abnormal muscle tone: no clonus   Skin: Skin is warm and dry.  Psychiatric: She has a normal mood and affect.  Dilation: 3.5 Effacement (%): 80 Cervical Position: Middle Station: -2 Presentation: Vertex Exam by:: [email or web address], CNM  NST:  Baseline: 150 Variability:  moderate Accels: 15x15 Decels: none Toco: rare  Results for orders placed or performed during the hospital encounter of 09/14/19 (from the past 24 hour(s))  Urinalysis, Routine w reflex microscopic     Status: Abnormal   Collection Time: 09/14/19  6:00 PM  Result Value Ref Range   Color, Urine YELLOW YELLOW   APPearance HAZY (A) CLEAR   Specific Gravity, Urine 1.014 1.005 - 1.030   pH 6.0 5.0 - 8.0   Glucose, UA NEGATIVE NEGATIVE mg/dL   Hgb urine dipstick NEGATIVE NEGATIVE   Bilirubin Urine NEGATIVE NEGATIVE   Ketones, ur 5 (A) NEGATIVE mg/dL   Protein, ur NEGATIVE NEGATIVE mg/dL   Nitrite NEGATIVE NEGATIVE   Leukocytes,Ua TRACE (A) NEGATIVE   RBC / HPF 0-5 0 - 5 RBC/hpf   WBC, UA 6-10 0 - 5 WBC/hpf   Bacteria, UA RARE (A) NONE SEEN   Squamous Epithelial / LPF 0-5 0 - 5   Mucus PRESENT   Protein / creatinine ratio, urine     Status: Abnormal   Collection Time: 09/14/19  6:00 PM  Result Value Ref Range   Creatinine, Urine 57.17 mg/dL   Total Protein, Urine 19 mg/dL   Protein Creatinine Ratio 0.33 (H) 0.00 - 0.15 mg/mg[Cre]  Rapid urine drug screen (hospital performed)     Status: None   Collection Time: 09/14/19  6:00 PM  Result Value Ref Range   Opiates NONE DETECTED NONE DETECTED   Cocaine NONE DETECTED NONE DETECTED   Benzodiazepines NONE DETECTED NONE DETECTED   Amphetamines NONE DETECTED NONE DETECTED   Tetrahydrocannabinol NONE DETECTED NONE DETECTED   Barbiturates NONE DETECTED NONE DETECTED  Comprehensive metabolic panel     Status: Abnormal   Collection Time: 09/14/19  6:09 PM  Result Value Ref Range   Sodium 135 135 - 145 mmol/L   Potassium 3.8 3.5 - 5.1 mmol/L   Chloride 105 98 - 111 mmol/L   CO2 19 (L) 22 - 32 mmol/L   Glucose, Bld 148 (H) 70 - 99 mg/dL   BUN <5 (L) 6 - 20 mg/dL   Creatinine, Ser 13/11/20 0.44 - 1.00 mg/dL   Calcium 9.3 8.9 - 7.82 mg/dL   Total Protein 5.9 (L) 6.5 - 8.1 g/dL   Albumin 2.3 (L) 3.5 - 5.0 g/dL   AST 20 15 - 41 U/L   ALT 13 0  - 44 U/L   Alkaline Phosphatase 96 38 - 126 U/L   Total Bilirubin 0.6 0.3 - 1.2 mg/dL   GFR calc non Af Amer >60 >60 mL/min   GFR calc Af  Amer >60 >60 mL/min   Anion gap 11 5 - 15  CBC     Status: Abnormal   Collection Time: 09/14/19  6:09 PM  Result Value Ref Range   WBC 8.2 4.0 - 10.5 K/uL   RBC 3.76 (L) 3.87 - 5.11 MIL/uL   Hemoglobin 11.8 (L) 12.0 - 15.0 g/dL   HCT 34.8 (L) 36.0 - 46.0 %   MCV 92.6 80.0 - 100.0 fL   MCH 31.4 26.0 - 34.0 pg   MCHC 33.9 30.0 - 36.0 g/dL   RDW 13.2 11.5 - 15.5 %   Platelets 235 150 - 400 K/uL   nRBC 0.0 0.0 - 0.2 %   Patient Vitals for the past 24 hrs:  BP Temp Temp src Pulse Resp SpO2 Height Weight  09/14/19 1951 110/65 - - 95 - - - -  09/14/19 1941 (!) 121/57 - - 92 - - - -  09/14/19 1931 (!) 108/57 - - 96 - - - -  09/14/19 1921 97/80 - - 95 - - - -  09/14/19 1914 111/68 - - 93 - - - -  09/14/19 1911 (!) 142/124 - - 86 - - - -  09/14/19 1901 (!) 135/101 - - 83 - - - -  09/14/19 1850 (!) 140/111 - - (!) 113 - - - -  09/14/19 1841 133/76 - - 94 - - - -  09/14/19 1827 132/87 - - 96 - 98 % - -  09/14/19 1821 (!) 147/78 - - 93 - - - -  09/14/19 1808 (!) 161/87 - - (!) 101 - - - -  09/14/19 1758 - - - - - 98 % - -  09/14/19 1752 (!) 156/91 - - (!) 112 - - - -  09/14/19 1732 (!) 161/84 98.2 F (36.8 C) Oral 100 20 99 % - -  09/14/19 1729 - - - - - - 5\' 5"  (1.651 m) 121.8 kg     MAU Course  Procedures  MDM  Patient has had IV labetalol, blood pressure has improved  1956: Consult with Dr. Mancel Bale, patient to be admitted to labor and delivery  Rapid covid test will be ordered, patient is currently 4cm and will be started on pitocin. There is a possibility that she could deliver in less than 6 hours.   Assessment and Plan   1. Pre-eclampsia in third trimester   2. [redacted] weeks gestation of pregnancy   3. NST (non-stress test) reactive    Admit to labor and delivery  IOL for pre-eclampsia   Marcille Buffy DNP, CNM  09/14/19  7:58 PM

## 2019-09-14 NOTE — H&P (Addendum)
OB ADMISSION/ HISTORY & PHYSICAL:  Admission Date: 09/14/2019  5:19 PM  Admit Diagnosis: Pre-eclampsia   Amanda Melton is a 33 y.o. female [phone] @ [redacted]w[redacted]d presented to MAU for labor eval.  Severe range BP noted in MAU. [place] labs collected and this midwife notified of BP and presentation. Plan for admission to L&D. Pt assessed on L&D, neg for neuro S/S, endorses active FM, denies LOF and vaginal bleeding. Ctx began @ 1600 today.     History of current pregnancy: [id number]   Patient entered care with [facility] at 15 weeks 6 days.   EDC of 10/06/19 was established by US/LMP and congruent w/ 6 week 5 day U/S.   Anatomy scan:  20 weeks, with normal findings and posterior placenta.   Last evaluation: 35w 6d, Vertex, EFW 4# 6oz, posterior placenta, AFI=14.6, BPP 8/8  Significant prenatal events: GHTN, GDM on metformin Patient Active Problem List   Diagnosis Date Noted  . Pre-eclampsia 09/14/2019  . Obesity 12/09/2013    Prenatal Labs: ABO, Rh: --/--/B POS, B POS Performed at [facility], [place] [address] [place], [place] [address]  [phone]) Antibody: NEG (11/11 1805) Rubella:  Immune  RPR:  Non-reactive  HBsAg:   Negative HIV:   Non-reactive GTT: Failed 3 hr gtt GBS:   Negative on 10/28 GC/CHL: Negative Genetics: Low-risk female, AFP normal Vaccines: Tdap and Influenza current    OB History  Gravida Para Term Preterm AB Living  5 3 2 1 1 3   SAB TAB Ectopic Multiple Live Births  1 0 0 0 3    # Outcome Date GA Lbr Len/2nd Weight Sex Delivery Anes PTL Lv  5 Current           4 Term 06/21/17 [redacted]w[redacted]d 05:05 / 00:10 3010 g F Vag-Spont EPI  LIV  3 Term 07/23/12 [redacted]w[redacted]d 09:11 / 00:08 2812 g M Vag-Spont EPI, Local  LIV     Birth Comments: none  2 Preterm 05/17/07 [redacted]w[redacted]d   M Vag-Spont   LIV  1 SAB             Medical / Surgical History: Past medical history:  Past Medical History:  Diagnosis Date  . Asthma   . Chlamydia 2005  . Environmental allergies   . Hypertension    gestational hypertension    Past surgical history:  Past Surgical History:  Procedure Laterality Date  . NO PAST SURGERIES     Family History:  Family History  Problem Relation Age of Onset  . Diabetes Father   . Hypertension Father   . Kidney failure Father   . Anesthesia problems Neg Hx     Social History:  reports that she has never smoked. She has never used smokeless tobacco. She reports previous alcohol use of about 3.0 standard drinks of alcohol per week. She reports that she does not use drugs.  Allergies: Patient has no known allergies.   Current Medications at time of admission:  Prior to Admission medications   Medication Sig Start Date End Date Taking? Authorizing Provider  acetaminophen (TYLENOL) 500 MG tablet Take 1,000 mg by mouth every 8 (eight) hours as needed for mild pain.    [provider]  albuterol (PROVENTIL HFA;VENTOLIN HFA) 108 (90 BASE) MCG/ACT inhaler Inhale 1-2 puffs into the lungs every 6 (six) hours as needed for wheezing or shortness of breath. 03/11/15   Fredia Sorrow, MD  aspirin EC 81 MG tablet Take 81 mg by mouth daily.  [provider]  cetirizine (ZYRTEC) 10 MG tablet Take 10 mg by mouth daily.    [provider]  cyclobenzaprine (FLEXERIL) 10 MG tablet Take 0.5-1 tablets (5-10 mg total) by mouth 2 (two) times daily as needed for muscle spasms. 08/06/19   Leftwich-Kirby, Wilmer FloorLisa A, CNM  Ferrous Sulfate (IRON PO) Take by mouth.    [provider]  Prenatal Vit-Fe Phos-FA-Omega (VITAFOL GUMMIES) 3.33-0.333-34.8 MG CHEW Chew 3 tablets by mouth daily before breakfast. 12/22/16   Brock BadHarper, Charles A, MD  promethazine (PHENERGAN) 12.5 MG tablet Take 1 tablet (12.5 mg total) by mouth every 8 (eight) hours as needed for up to 30 doses for nausea or vomiting. 02/15/19   Nugent, Odie SeraNicole E, NP    Review of Systems: Constitutional: Negative   HENT: Negative   Eyes: Negative   Respiratory: Negative   Cardiovascular: Negative    Gastrointestinal: Negative  Genitourinary: negative for bloody show, negative for LOF   Musculoskeletal: Negative   Skin: Negative   Neurological: Negative   Endo/Heme/Allergies: Negative   Psychiatric/Behavioral: Negative   CBC    Component Value Date/Time   WBC 8.2 09/14/2019 1809   RBC 3.76 (L) 09/14/2019 1809   HGB 11.8 (L) 09/14/2019 1809   HGB 10.5 (L) 05/26/2017 1140   HGB 12.6 01/09/2016 1525   HCT 34.8 (L) 09/14/2019 1809   HCT 30.9 (L) 05/26/2017 1140   PLT 235 09/14/2019 1809   PLT 216 05/26/2017 1140   MCV 92.6 09/14/2019 1809   MCV 86 05/26/2017 1140   MCH 31.4 09/14/2019 1809   MCHC 33.9 09/14/2019 1809   RDW 13.2 09/14/2019 1809   RDW 13.1 05/26/2017 1140   LYMPHSABS 2.5 06/03/2017 1616   LYMPHSABS 2.9 12/22/2016 1636   MONOABS 0.7 06/03/2017 1616   EOSABS 0.3 06/03/2017 1616   EOSABS 0.4 12/22/2016 1636   BASOSABS 0.0 06/03/2017 1616   BASOSABS 0.1 12/22/2016 1636   CMP     Component Value Date/Time   NA 135 09/14/2019 1809   K 3.8 09/14/2019 1809   CL 105 09/14/2019 1809   CO2 19 (L) 09/14/2019 1809   GLUCOSE 148 (H) 09/14/2019 1809   BUN <5 (L) 09/14/2019 1809   CREATININE 0.53 09/14/2019 1809   CREATININE 0.90 01/09/2016 1557   CALCIUM 9.3 09/14/2019 1809   PROT 5.9 (L) 09/14/2019 1809   ALBUMIN 2.3 (L) 09/14/2019 1809   AST 20 09/14/2019 1809   ALT 13 09/14/2019 1809   ALKPHOS 96 09/14/2019 1809   BILITOT 0.6 09/14/2019 1809   GFRNONAA >60 09/14/2019 1809   GFRAA >60 09/14/2019 1809   PCR 0.3  Physical Exam: VS: Blood pressure 132/67, pulse 72, temperature 98.9 F (37.2 C), temperature source Oral, resp. rate 20, height 5\' 5"  (1.651 m), weight 121.8 kg, last menstrual period 01/15/2019, SpO2 98 %. AAO x3, no signs of distress Cardiovascular: RRR Respiratory: Lung fields clear to ausculation GU/GI: Abdomen gravid, non-tender, non-distended, active FM, vertex and 6# per Leopold's Extremities: No edema, negative for pain, tenderness,  and cords Neuro: 2+ reflexes, neg clonus  Cervical exam:Dilation: 4 Effacement (%): 80 Station: -3 Exam by:: Marshia LyKatie Kirkman, RN FHR: baseline rate 135 / variability moderate / accelerations present / absent decelerations TOCO: 4-6 min   Prenatal Transfer Tool  Maternal Diabetes: Yes:  Diabetes Type:  Insulin/Medication controlled Metformin Genetic Screening: Normal Maternal Ultrasounds/Referrals: Normal Fetal Ultrasounds or other Referrals:  None Maternal Substance Abuse:  No Significant Maternal Medications:  Meds include: Other: Metformin Significant Maternal  Lab Results: Group B Strep negative    Assessment/Plan: 33 y.o. [id number] [redacted]w[redacted]d Pre-eclampsia     -Treat severe range BP per protocol and notify provider      -Plan MgSO4 PP  GDM     -CBG Q2 hr in active labor     -Hold Metformin until reg diet is resumed  Latent stage of labor     -Admit to L&D     -Routine admission orders     -Augment w/ Pitocin  FHR category 1     -cont EFM  GBS negative  Pain management     -epidural per pt request  PPH risk     -Active manangement of third stage     -Hx of abruption w/ last birth 2 yrs ago     -Have Cytotec 1000 mcg available at delivery     -Avoid Methergine       Dr Su Hilt aware of admission and plan of care  Roma Schanz CNM, MSN 09/14/2019 10:47 PM

## 2019-09-14 NOTE — MAU Note (Signed)
Presents with ctxs 5 minutes apart x1.5 hours.  Denies VB or LOF.  Endorses +FM.  Reports GDM, taking Metformin 500 mg twice per day.

## 2019-09-15 ENCOUNTER — Inpatient Hospital Stay (HOSPITAL_COMMUNITY): Payer: Commercial Managed Care - PPO | Admitting: Anesthesiology

## 2019-09-15 ENCOUNTER — Encounter (HOSPITAL_COMMUNITY)
Admission: AD | Disposition: A | Discharge status: Home / Self Care | Payer: Self-pay | Source: Home / Self Care | Attending: Obstetrics & Gynecology

## 2019-09-15 ENCOUNTER — Encounter (HOSPITAL_COMMUNITY): Payer: Self-pay

## 2019-09-15 HISTORY — PX: TUBAL LIGATION: SHX77

## 2019-09-15 LAB — RPR: RPR Ser Ql: NONREACTIVE

## 2019-09-15 LAB — CBC
HCT: 32.3 % — ABNORMAL LOW (ref 36.0–46.0)
Hemoglobin: 11.2 g/dL — ABNORMAL LOW (ref 12.0–15.0)
MCH: 32.3 pg (ref 26.0–34.0)
MCHC: 34.7 g/dL (ref 30.0–36.0)
MCV: 93.1 fL (ref 80.0–100.0)
Platelets: 214 10*3/uL (ref 150–400)
RBC: 3.47 MIL/uL — ABNORMAL LOW (ref 3.87–5.11)
RDW: 13.3 % (ref 11.5–15.5)
WBC: 11.6 10*3/uL — ABNORMAL HIGH (ref 4.0–10.5)
nRBC: 0 % (ref 0.0–0.2)

## 2019-09-15 SURGERY — LIGATION, FALLOPIAN TUBE, POSTPARTUM
Anesthesia: Spinal | Site: Abdomen | Laterality: Bilateral | Wound class: Clean Contaminated

## 2019-09-15 MED ORDER — LABETALOL HCL 5 MG/ML IV SOLN: 20.0000 mg | INTRAVENOUS | Status: DC | PRN

## 2019-09-15 MED ORDER — HYDROMORPHONE HCL 1 MG/ML IJ SOLN: 0.2500 mg | INTRAMUSCULAR | Status: DC | PRN

## 2019-09-15 MED ORDER — METOCLOPRAMIDE HCL 5 MG/ML IJ SOLN
INTRAMUSCULAR | Status: DC | PRN
  Administered 2019-09-15 (×2): 5 mg via INTRAVENOUS

## 2019-09-15 MED ORDER — ONDANSETRON HCL 4 MG/2ML IJ SOLN: 4.0000 mg | INTRAMUSCULAR | Status: DC | PRN

## 2019-09-15 MED ORDER — ONDANSETRON HCL 4 MG/2ML IJ SOLN
INTRAMUSCULAR | Status: DC | PRN
  Administered 2019-09-15: 4 mg via INTRAVENOUS

## 2019-09-15 MED ORDER — MIDAZOLAM HCL 2 MG/2ML IJ SOLN
INTRAMUSCULAR | Status: AC
  Filled 2019-09-15: qty 2

## 2019-09-15 MED ORDER — MAGNESIUM SULFATE BOLUS VIA INFUSION
4.0000 g | Freq: Once | INTRAVENOUS | Status: AC
  Administered 2019-09-15: 4 g via INTRAVENOUS
  Filled 2019-09-15: qty 1000

## 2019-09-15 MED ORDER — METOCLOPRAMIDE HCL 5 MG/ML IJ SOLN
INTRAMUSCULAR | Status: AC
  Filled 2019-09-15: qty 2

## 2019-09-15 MED ORDER — OXYCODONE HCL 5 MG PO TABS: 5.0000 mg | ORAL_TABLET | Freq: Once | ORAL | Status: AC | PRN

## 2019-09-15 MED ORDER — ONDANSETRON HCL 4 MG PO TABS: 4.0000 mg | ORAL_TABLET | ORAL | Status: DC | PRN

## 2019-09-15 MED ORDER — KETOROLAC TROMETHAMINE 30 MG/ML IJ SOLN
INTRAMUSCULAR | Status: AC
  Filled 2019-09-15: qty 1

## 2019-09-15 MED ORDER — MISOPROSTOL 200 MCG PO TABS
ORAL_TABLET | ORAL | Status: AC
  Filled 2019-09-15: qty 5

## 2019-09-15 MED ORDER — HYDRALAZINE HCL 20 MG/ML IJ SOLN: 10.0000 mg | INTRAMUSCULAR | Status: DC | PRN

## 2019-09-15 MED ORDER — KETOROLAC TROMETHAMINE 30 MG/ML IJ SOLN
30.0000 mg | Freq: Once | INTRAMUSCULAR | Status: AC | PRN
  Administered 2019-09-15: 30 mg via INTRAVENOUS

## 2019-09-15 MED ORDER — SIMETHICONE 80 MG PO CHEW: 80.0000 mg | CHEWABLE_TABLET | ORAL | Status: DC | PRN

## 2019-09-15 MED ORDER — LABETALOL HCL 5 MG/ML IV SOLN: 40.0000 mg | INTRAVENOUS | Status: DC | PRN

## 2019-09-15 MED ORDER — PRENATAL MULTIVITAMIN CH: 1.0000 | ORAL_TABLET | Freq: Every day | ORAL | Status: DC

## 2019-09-15 MED ORDER — TETANUS-DIPHTH-ACELL PERTUSSIS 5-2.5-18.5 LF-MCG/0.5 IM SUSP: 0.5000 mL | Freq: Once | INTRAMUSCULAR | Status: DC

## 2019-09-15 MED ORDER — LACTATED RINGERS IV SOLN
INTRAVENOUS | Status: DC
  Administered 2019-09-15 (×3): via INTRAVENOUS

## 2019-09-15 MED ORDER — WITCH HAZEL-GLYCERIN EX PADS: 1.0000 "application " | MEDICATED_PAD | CUTANEOUS | Status: DC | PRN

## 2019-09-15 MED ORDER — METOCLOPRAMIDE HCL 10 MG PO TABS
10.0000 mg | ORAL_TABLET | Freq: Once | ORAL | Status: AC
  Administered 2019-09-15: 10 mg via ORAL
  Filled 2019-09-15: qty 1

## 2019-09-15 MED ORDER — ACETAMINOPHEN 325 MG PO TABS
650.0000 mg | ORAL_TABLET | ORAL | Status: DC | PRN
  Administered 2019-09-15 (×2): 650 mg via ORAL
  Filled 2019-09-15 (×2): qty 2

## 2019-09-15 MED ORDER — BUPIVACAINE HCL (PF) 0.25 % IJ SOLN
INTRAMUSCULAR | Status: AC
  Filled 2019-09-15: qty 20

## 2019-09-15 MED ORDER — FAMOTIDINE 20 MG PO TABS
40.0000 mg | ORAL_TABLET | Freq: Once | ORAL | Status: AC
  Administered 2019-09-15: 40 mg via ORAL
  Filled 2019-09-15: qty 2

## 2019-09-15 MED ORDER — MAGNESIUM SULFATE 40 GM/1000ML IV SOLN
2.0000 g/h | INTRAVENOUS | Status: AC
  Administered 2019-09-15 (×3): 2 g/h via INTRAVENOUS
  Filled 2019-09-15 (×2): qty 1000

## 2019-09-15 MED ORDER — HYDRALAZINE HCL 20 MG/ML IJ SOLN: 5.0000 mg | INTRAMUSCULAR | Status: DC | PRN

## 2019-09-15 MED ORDER — SODIUM CHLORIDE 0.9 % IR SOLN
Status: DC | PRN
  Administered 2019-09-15: 1

## 2019-09-15 MED ORDER — FENTANYL CITRATE (PF) 100 MCG/2ML IJ SOLN
INTRAMUSCULAR | Status: AC
  Filled 2019-09-15: qty 2

## 2019-09-15 MED ORDER — ZOLPIDEM TARTRATE 5 MG PO TABS: 5.0000 mg | ORAL_TABLET | Freq: Every evening | ORAL | Status: DC | PRN

## 2019-09-15 MED ORDER — BUPIVACAINE HCL (PF) 0.25 % IJ SOLN
INTRAMUSCULAR | Status: DC | PRN
  Administered 2019-09-15: 10 mL

## 2019-09-15 MED ORDER — OXYCODONE HCL 5 MG/5ML PO SOLN
ORAL | Status: AC
  Filled 2019-09-15: qty 5

## 2019-09-15 MED ORDER — OXYCODONE HCL 5 MG/5ML PO SOLN
5.0000 mg | Freq: Once | ORAL | Status: AC | PRN
  Administered 2019-09-15: 5 mg via ORAL

## 2019-09-15 MED ORDER — OXYCODONE HCL 5 MG PO TABS
5.0000 mg | ORAL_TABLET | ORAL | Status: DC | PRN
  Administered 2019-09-15 – 2019-09-16 (×5): 5 mg via ORAL
  Filled 2019-09-15 (×5): qty 1

## 2019-09-15 MED ORDER — BUPIVACAINE IN DEXTROSE 0.75-8.25 % IT SOLN
INTRATHECAL | Status: DC | PRN
  Administered 2019-09-15: 1.4 mL via INTRATHECAL

## 2019-09-15 MED ORDER — MISOPROSTOL 200 MCG PO TABS
800.0000 ug | ORAL_TABLET | Freq: Once | ORAL | Status: AC
  Administered 2019-09-15: 800 ug via RECTAL

## 2019-09-15 MED ORDER — ONDANSETRON HCL 4 MG/2ML IJ SOLN
INTRAMUSCULAR | Status: AC
  Filled 2019-09-15: qty 2

## 2019-09-15 MED ORDER — DIBUCAINE (PERIANAL) 1 % EX OINT: 1.0000 "application " | TOPICAL_OINTMENT | CUTANEOUS | Status: DC | PRN

## 2019-09-15 MED ORDER — FENTANYL CITRATE (PF) 100 MCG/2ML IJ SOLN
INTRAMUSCULAR | Status: DC | PRN
  Administered 2019-09-15 (×2): 50 ug via INTRAVENOUS

## 2019-09-15 MED ORDER — LACTATED RINGERS IV SOLN
INTRAVENOUS | Status: DC
  Administered 2019-09-15: 07:00:00 via INTRAVENOUS

## 2019-09-15 MED ORDER — FERROUS SULFATE 325 (65 FE) MG PO TABS
325.0000 mg | ORAL_TABLET | Freq: Two times a day (BID) | ORAL | Status: DC
  Filled 2019-09-15 (×2): qty 1

## 2019-09-15 MED ORDER — BENZOCAINE-MENTHOL 20-0.5 % EX AERO
1.0000 "application " | INHALATION_SPRAY | CUTANEOUS | Status: DC | PRN
  Administered 2019-09-15: 1 via TOPICAL
  Filled 2019-09-15: qty 56

## 2019-09-15 MED ORDER — MIDAZOLAM HCL 2 MG/2ML IJ SOLN
INTRAMUSCULAR | Status: DC | PRN
  Administered 2019-09-15 (×2): 1 mg via INTRAVENOUS

## 2019-09-15 MED ORDER — DIPHENHYDRAMINE HCL 25 MG PO CAPS: 25.0000 mg | ORAL_CAPSULE | Freq: Four times a day (QID) | ORAL | Status: DC | PRN

## 2019-09-15 MED ORDER — ONDANSETRON HCL 4 MG/2ML IJ SOLN: 4.0000 mg | Freq: Once | INTRAMUSCULAR | Status: DC | PRN

## 2019-09-15 MED ORDER — COCONUT OIL OIL: 1.0000 "application " | TOPICAL_OIL | Status: DC | PRN

## 2019-09-15 MED ORDER — SENNOSIDES-DOCUSATE SODIUM 8.6-50 MG PO TABS
2.0000 | ORAL_TABLET | ORAL | Status: DC
  Administered 2019-09-15: 2 via ORAL
  Filled 2019-09-15: qty 2

## 2019-09-15 SURGICAL SUPPLY — 25 items
ADH SKN CLS APL DERMABOND .7 (GAUZE/BANDAGES/DRESSINGS) ×1
CLOTH BEACON ORANGE TIMEOUT ST (SAFETY) ×2 IMPLANT
DERMABOND ADVANCED (GAUZE/BANDAGES/DRESSINGS) ×1
DERMABOND ADVANCED .7 DNX12 (GAUZE/BANDAGES/DRESSINGS) ×1 IMPLANT
DRSG OPSITE POSTOP 3X4 (GAUZE/BANDAGES/DRESSINGS) ×2 IMPLANT
DURAPREP 26ML APPLICATOR (WOUND CARE) ×2 IMPLANT
ELECT REM PT RETURN 9FT ADLT (ELECTROSURGICAL) ×2
ELECTRODE REM PT RTRN 9FT ADLT (ELECTROSURGICAL) ×1 IMPLANT
GLOVE BIO SURGEON STRL SZ 6.5 (GLOVE) ×2 IMPLANT
GLOVE BIOGEL PI IND STRL 7.0 (GLOVE) ×3 IMPLANT
GLOVE BIOGEL PI INDICATOR 7.0 (GLOVE) ×3
GOWN STRL REUS W/TWL LRG LVL3 (GOWN DISPOSABLE) ×4 IMPLANT
NEEDLE HYPO 22GX1.5 SAFETY (NEEDLE) ×2 IMPLANT
NS IRRIG 1000ML POUR BTL (IV SOLUTION) ×2 IMPLANT
PACK ABDOMINAL MINOR (CUSTOM PROCEDURE TRAY) ×2 IMPLANT
PENCIL BUTTON HOLSTER BLD 10FT (ELECTRODE) ×2 IMPLANT
PROTECTOR NERVE ULNAR (MISCELLANEOUS) ×2 IMPLANT
SPONGE LAP 4X18 RFD (DISPOSABLE) ×2 IMPLANT
SUT MON AB 4-0 PS1 27 (SUTURE) ×2 IMPLANT
SUT PLAIN 0 NONE (SUTURE) ×1 IMPLANT
SUT VICRYL 0 UR6 27IN ABS (SUTURE) ×3 IMPLANT
SYR CONTROL 10ML LL (SYRINGE) ×2 IMPLANT
TOWEL OR 17X24 6PK STRL BLUE (TOWEL DISPOSABLE) ×4 IMPLANT
TRAY FOLEY CATH SILVER 14FR (SET/KITS/TRAYS/PACK) ×2 IMPLANT
WATER STERILE IRR 1000ML POUR (IV SOLUTION) ×2 IMPLANT

## 2019-09-15 NOTE — Anesthesia Preprocedure Evaluation (Signed)
Anesthesia Evaluation  Patient identified by MRN, date of birth, ID band Patient awake    Reviewed: Allergy & Precautions, NPO status , Patient's Chart, lab work & pertinent test results  Airway Mallampati: II  TM Distance: >3 FB Neck ROM: Full    Dental no notable dental hx. (+) Teeth Intact   Pulmonary asthma ,    Pulmonary exam normal breath sounds clear to auscultation       Cardiovascular hypertension, Pt. on medications Normal cardiovascular exam Rhythm:Regular Rate:Normal  On Mg++   Neuro/Psych negative neurological ROS  negative psych ROS   GI/Hepatic negative GI ROS, Neg liver ROS,   Endo/Other  negative endocrine ROS  Renal/GU Cr 0.53     Musculoskeletal negative musculoskeletal ROS (+)   Abdominal (+) + obese,   Peds  Hematology Hgb 11.2 Plt 214   Anesthesia Other Findings   Reproductive/Obstetrics negative OB ROS                            Anesthesia Physical Anesthesia Plan  ASA: III  Anesthesia Plan: Spinal   Post-op Pain Management:    Induction:   PONV Risk Score and Plan: Treatment may vary due to age or medical condition and Ondansetron  Airway Management Planned: Nasal Cannula and Natural Airway  Additional Equipment:   Intra-op Plan:   Post-operative Plan:   Informed Consent: I have reviewed the patients History and Physical, chart, labs and discussed the procedure including the risks, benefits and alternatives for the proposed anesthesia with the patient or authorized representative who has indicated his/her understanding and acceptance.     Dental advisory given  Plan Discussed with: CRNA  Anesthesia Plan Comments: (G5P3 w pre Eclampsia On Mg++ gor PPTL under Spinal)       Anesthesia Quick Evaluation

## 2019-09-15 NOTE — Progress Notes (Signed)
Called Dr Alwyn Pea to verify BTL time.  MD states she is able to proceed with scheduled BTL at 1030 & requests that magnesium sulfate be held at 0930 in preparation of BTL.

## 2019-09-15 NOTE — Op Note (Signed)
OPERATIVE NOTE  Amanda Melton  DOB:    17-Feb-1986  MRN:    [id number]  CSN:    [id number]  Date of Surgery:  09/15/2019   Preoperative Diagnosis: Desired Sterilization  Postoperative Diagnosis: Same as above  Procedure:  Post partum tubal ligation via [place]  Surgeon:  Mady Haagensen. Alwyn Pea, M.D.  Assistant: None  Anesthetic: Local, Epidural    Fluids:  600 mL EBL: 5  mL Complications: None  Indication:  [name] is a 33 y.o. year old female,who desires permanent sterilization. Risks, benefits, alternatives and indication was explained to patient prior to the procedure.   Findings:  After adequate level of epidural was determined for surgery the patient was prepped and draped in the usual sterile fashion.  A small subumbilical incision was performed along a previous scar using a 15 blade scalpel and the previous keloid scar removed. The fascia was entered sharply using Mayo scissors and the peritoneum identified and easily entered. The fundus of the uterus was easily located with the surgeon's fingers through the incision and the right fallopian tube was swept into view so that the midportion of the tube could be grasped with Babcock clamps and delivered. As the loop was held up, the fallopian tube was traced down to the fimbriated end to ensure that this was the fallopian tube. The midportion of the fallopian tube was held with the Babcock clamp and a window into the mesosalpinx was made with the bovie. The tube was ligated twice with two free tie sutures of 0-plain catgut. The loop was cut away with Metzenbaum scissors and hemostasis was performed using electrocautery. The fimbriated ends of both tubes were ligated with 0-plain gut and removed. The tubal segment was handed off to be sent to Pathology. The above process was repeated on the opposite side   Each The fallopian tube stump was placed back into the peritoneum and Marcaine drizzled over it. The fascia was  reapproximated with 2-0 vicryl on a UR-6 needle. The skin was reapproximated with subcuticular stitch of 4-0 monocryl. The patient's incision was cleaned and dermabond was placed.   The patient tolerated the procedure well and was sent to the PACU in good condition. All instrument, sponge and needle counts were correct x 2  [name]

## 2019-09-15 NOTE — Anesthesia Postprocedure Evaluation (Signed)
Anesthesia Post Note  Patient: Amanda Melton  Procedure(s) Performed: POST PARTUM TUBAL LIGATION (Bilateral Abdomen)     Patient location during evaluation: Mother Baby Anesthesia Type: Spinal Level of consciousness: oriented and awake and alert Pain management: pain level controlled Vital Signs Assessment: post-procedure vital signs reviewed and stable Respiratory status: spontaneous breathing and respiratory function stable Cardiovascular status: blood pressure returned to baseline and stable Postop Assessment: no headache, no backache, no apparent nausea or vomiting and able to ambulate Anesthetic complications: no    Last Vitals:  Vitals:   09/15/19 1254 09/15/19 1343  BP: 136/67 131/64  Pulse: 63 75  Resp: 16 16  Temp: 36.6 C 36.6 C  SpO2:  100%    Last Pain:  Vitals:   09/15/19 1343  TempSrc: Oral  PainSc:    Pain Goal: Patients Stated Pain Goal: 2 (09/15/19 0606)              Epidural/Spinal Function Cutaneous sensation: Tingles (09/15/19 1342), Patient able to flex knees: Yes (09/15/19 1342), Patient able to lift hips off bed: Yes (09/15/19 1342), Back pain beyond tenderness at insertion site: No (09/15/19 1342), Progressively worsening motor and/or sensory loss: No (09/15/19 1342), Bowel and/or bladder incontinence post epidural: No (09/15/19 1342)  Barnet Glasgow

## 2019-09-15 NOTE — Progress Notes (Signed)
Voicemail left for Burman Foster CNM to clarify pain medication orders for patient. Awaiting call back.

## 2019-09-15 NOTE — Progress Notes (Signed)
Stable BP Tracing reviewed remotely cat 1 Getting an epidural

## 2019-09-15 NOTE — Anesthesia Procedure Notes (Signed)
Spinal  Patient location during procedure: OB Start time: 09/15/2019 10:35 AM End time: 09/15/2019 10:41 AM Staffing Anesthesiologist: Barnet Glasgow, MD Performed: anesthesiologist  Preanesthetic Checklist Completed: patient identified, surgical consent, pre-op evaluation, timeout performed, IV checked, risks and benefits discussed and monitors and equipment checked Spinal Block Patient position: sitting Prep: site prepped and draped and DuraPrep Patient monitoring: heart rate, cardiac monitor, continuous pulse ox and blood pressure Approach: midline Location: L3-4 Injection technique: single-shot Needle Needle type: Pencan  Needle gauge: 24 G Needle length: 10 cm Needle insertion depth: 7 cm Assessment Sensory level: T4 Additional Notes 1 Attempt (s). Pt tolerated procedure well.

## 2019-09-16 ENCOUNTER — Encounter (HOSPITAL_COMMUNITY): Payer: Self-pay | Admitting: Obstetrics & Gynecology

## 2019-09-16 LAB — SURGICAL PATHOLOGY

## 2019-09-16 MED ORDER — OXYCODONE HCL 5 MG PO TABS: 5.0000 mg | ORAL_TABLET | Freq: Four times a day (QID) | ORAL | 0 refills | Status: AC | PRN

## 2019-09-16 MED ORDER — OXYCODONE HCL 5 MG PO TABS: 5.0000 mg | ORAL_TABLET | Freq: Four times a day (QID) | ORAL | 0 refills | Status: DC | PRN

## 2019-09-16 NOTE — Progress Notes (Signed)
Discharge teaching complete with pt. Preeclampsia signs and symptoms discussed. Sitz bath given to pt. Pt discharged home with family.

## 2019-09-16 NOTE — Discharge Summary (Signed)
OB Discharge Summary     Patient Name: Amanda Melton DOB: 1986/04/06 MRN: [id number]  Date of admission: 09/14/2019 Delivering MD: Rhea Pink B   Date of discharge: 09/16/2019  Admitting diagnosis: CTX every 5 min Intrauterine pregnancy: [redacted]w[redacted]d     Secondary diagnosis:  Active Problems:   Pre-eclampsia   SVD (11/12)  Additional problems:  Patient Active Problem List   Diagnosis Date Noted  . SVD (11/12) 09/15/2019  . Pre-eclampsia 09/14/2019  . Obesity 12/09/2013        Discharge diagnosis: Term Pregnancy Delivered and Preeclampsia (severe)                                                                                                Post partum procedures:postpartum tubal ligation  Augmentation: Pitocin  Complications: None  Hospital course:  Induction of Labor With Vaginal Delivery   33 y.o. yo [id number] at [redacted]w[redacted]d was admitted to the hospital 09/14/2019 for induction of labor.  Indication for induction: Preeclampsia.  Patient had an uncomplicated labor course as follows: Membrane Rupture Time/Date:  ,   Intrapartum Procedures: Episiotomy: None [1]                                         Lacerations:  Labial [10]  Patient had delivery of a Viable infant.  Information for the patient's newborn:  Caasi, Giglia Girl Kellee [[id number]]  Delivery Method: Vaginal, Spontaneous(Filed from Delivery Summary)    09/15/2019  Details of delivery can be found in separate delivery note.  Patient had a routine postpartum course. Patient is discharged home 09/16/19.  Physical exam  Vitals:   09/15/19 1606 09/15/19 1954 09/15/19 2332 09/16/19 0411  BP: 123/65 119/72 132/81 123/68  Pulse: 80 85 88 90  Resp: 14 17 18 20   Temp: 98.1 F (36.7 C) 97.9 F (36.6 C) 98 F (36.7 C) 98.2 F (36.8 C)  TempSrc: Oral Oral Oral Oral  SpO2: 98% 99% 100% 99%  Weight:      Height:       General: alert, cooperative and no distress Lochia: appropriate Uterine Fundus: firm Incision: Dressing is  clean, dry, and intact DVT Evaluation: No evidence of DVT seen on physical exam. No cords or calf tenderness. No significant calf/ankle edema. Labs: Lab Results  Component Value Date   WBC 11.6 (H) 09/15/2019   HGB 11.2 (L) 09/15/2019   HCT 32.3 (L) 09/15/2019   MCV 93.1 09/15/2019   PLT 214 09/15/2019   CMP Latest Ref Rng & Units 09/14/2019  Glucose 70 - 99 mg/dL 13/09/2019)  BUN 6 - 20 mg/dL 160(F)  Creatinine <0(X - 1.00 mg/dL 3.23  Sodium 5.57 - 322 mmol/L 135  Potassium 3.5 - 5.1 mmol/L 3.8  Chloride 98 - 111 mmol/L 105  CO2 22 - 32 mmol/L 19(L)  Calcium 8.9 - 10.3 mg/dL 9.3  Total Protein 6.5 - 8.1 g/dL 5.9(L)  Total Bilirubin 0.3 - 1.2 mg/dL 0.6  Alkaline Phos 38 - 126 U/L 96  AST  15 - 41 U/L 20  ALT 0 - 44 U/L 13    Discharge instruction: per After Visit Summary and "Baby and Me Booklet".  After visit meds:  Allergies as of 09/16/2019   No Known Allergies     Medication List    STOP taking these medications   aspirin EC 81 MG tablet   promethazine 12.5 MG tablet Commonly known as: PHENERGAN     TAKE these medications   acetaminophen 500 MG tablet Commonly known as: TYLENOL Take 1,000 mg by mouth every 8 (eight) hours as needed for mild pain.   albuterol 108 (90 Base) MCG/ACT inhaler Commonly known as: VENTOLIN HFA Inhale 1-2 puffs into the lungs every 6 (six) hours as needed for wheezing or shortness of breath.   cetirizine 10 MG tablet Commonly known as: ZYRTEC Take 10 mg by mouth daily.   cyclobenzaprine 10 MG tablet Commonly known as: FLEXERIL Take 0.5-1 tablets (5-10 mg total) by mouth 2 (two) times daily as needed for muscle spasms.   IRON PO Take by mouth.   oxyCODONE 5 MG immediate release tablet Commonly known as: Oxy IR/ROXICODONE Take 1 tablet (5 mg total) by mouth every 6 (six) hours as needed for up to 3 days for severe pain.   Vitafol Gummies 3.33-0.333-34.8 MG Chew Chew 3 tablets by mouth daily before breakfast.       Diet: low  salt diet  Activity: Advance as tolerated. Pelvic rest for 6 weeks.   Outpatient follow YI:FOYDXA to [facility] in 1 week for BP check and then in 6 weeks for regular postpartum visit. Follow up Appt:No future appointments. Follow up Visit:No follow-ups on file.  Postpartum contraception: Tubal Ligation  Newborn Data: Live born female "Zourie" Birth Weight: 7 lb 3.5 oz (3274 g) APGAR: 9, 9  Newborn Delivery   Birth date/time: 09/15/2019 01:29:00 Delivery type: Vaginal, Spontaneous      Baby Feeding: Bottle Disposition:home with mother   09/16/2019 Arrie Eastern, CNM

## 2019-09-16 NOTE — Transfer of Care (Signed)
Immediate Anesthesia Transfer of Care Note  Patient: Amanda Melton  Procedure(s) Performed: POST PARTUM TUBAL LIGATION (Bilateral Abdomen)  Patient Location: PACU  Anesthesia Type:Spinal  Level of Consciousness: awake, alert  and oriented  Airway & Oxygen Therapy: Patient Spontanous Breathing  Post-op Assessment: Report given to RN and Post -op Vital signs reviewed and stable  Post vital signs: Reviewed and stable  Last Vitals:  Vitals Value Taken Time  BP 117/86 09/16/19 0810  Temp 36.9 C 09/16/19 0754  Pulse 81 09/16/19 0823  Resp 18 09/16/19 0754  SpO2 99 % 09/16/19 0823  Vitals shown include unvalidated device data.  Last Pain:  Vitals:   09/16/19 1052  TempSrc:   PainSc: 6       Patients Stated Pain Goal: 3 (66/29/47 6546)  Complications: No apparent anesthesia complications

## 2019-09-22 DIAGNOSIS — I10 Essential (primary) hypertension: Secondary | ICD-10-CM | POA: Diagnosis not present

## 2019-09-30 ENCOUNTER — Other Ambulatory Visit (HOSPITAL_COMMUNITY): Payer: Medicaid Other

## 2019-10-18 DIAGNOSIS — N764 Abscess of vulva: Secondary | ICD-10-CM | POA: Diagnosis not present

## 2019-10-31 DIAGNOSIS — F53 Postpartum depression: Secondary | ICD-10-CM | POA: Diagnosis not present

## 2019-11-14 ENCOUNTER — Other Ambulatory Visit: Payer: Medicaid Other

## 2019-11-17 ENCOUNTER — Telehealth: Payer: Self-pay | Admitting: *Deleted

## 2019-11-17 NOTE — Telephone Encounter (Signed)
-----   Message from Ernst Bowler sent at 11/09/2019  7:55 AM EST ----- Status on [employer] website is "Lost", which means the monitor was never returned to [employer].  I believe they try to reach out to patient 2-3 times to request monitor be returned. ----- Message ----- From: Burnell Blanks, LPN Sent: 12/07/4626  10:24 PM EST To: Burnell Blanks, LPN, Ernst Bowler, #  Hello ladies Just f/u on monitor for this pt Thx Shevaun Lovan

## 2019-12-22 ENCOUNTER — Inpatient Hospital Stay (HOSPITAL_COMMUNITY)
Admission: AD | Admit: 2019-12-22 | Discharge: 2019-12-22 | Disposition: A | Discharge status: Home / Self Care | Payer: Medicaid Other | Attending: Obstetrics and Gynecology | Admitting: Obstetrics and Gynecology

## 2019-12-22 ENCOUNTER — Other Ambulatory Visit: Payer: Self-pay

## 2019-12-22 DIAGNOSIS — Z79899 Other long term (current) drug therapy: Secondary | ICD-10-CM | POA: Insufficient documentation

## 2019-12-22 DIAGNOSIS — Z3202 Encounter for pregnancy test, result negative: Secondary | ICD-10-CM | POA: Diagnosis not present

## 2019-12-22 DIAGNOSIS — J45909 Unspecified asthma, uncomplicated: Secondary | ICD-10-CM | POA: Insufficient documentation

## 2019-12-22 DIAGNOSIS — Z9851 Tubal ligation status: Secondary | ICD-10-CM | POA: Insufficient documentation

## 2019-12-22 DIAGNOSIS — N898 Other specified noninflammatory disorders of vagina: Secondary | ICD-10-CM | POA: Insufficient documentation

## 2019-12-22 NOTE — Discharge Instructions (Signed)

## 2019-12-22 NOTE — MAU Note (Signed)
Has had this really leaky d/c for the past 2 days.  Starting to have vag pain.  Has slight odor

## 2019-12-22 NOTE — MAU Provider Note (Signed)
Chief Complaint: Vaginal Discharge and Vaginal Pain   First Provider Initiated Contact with Patient 12/22/19 1615      SUBJECTIVE HPI: Amanda Melton is a 34 y.o. [id number] who presents to maternity admissions for evaluation of vaginal discharge with irritation x 2 days. The patient had her last child 3 months ago and had BTL at that time. She does not think that she is pregnant.     Past Medical History:  Diagnosis Date  . Asthma   . Chlamydia 2005  . Environmental allergies   . Hypertension    gestational hypertension   Past Surgical History:  Procedure Laterality Date  . NO PAST SURGERIES    . TUBAL LIGATION Bilateral 09/15/2019   Procedure: POST PARTUM TUBAL LIGATION;  Surgeon: Sanjuana Kava, MD;  Location: [facility] LD ORS;  Service: Gynecology;  Laterality: Bilateral;   Social History   Socioeconomic History  . Marital status: Married    Spouse name: Not on file  . Number of children: 2  . Years of education: Not on file  . Highest education level: Not on file  Occupational History    Employer: [employer]  Tobacco Use  . Smoking status: Never Smoker  . Smokeless tobacco: Never Used  Substance and Sexual Activity  . Alcohol use: Not Currently    Alcohol/week: 3.0 standard drinks    Types: 3 Standard drinks or equivalent per week    Comment: occasionally  . Drug use: No  . Sexual activity: Yes    Partners: Male  Other Topics Concern  . Not on file  Social History Narrative  . Not on file   Social Determinants of Health   Financial Resource Strain:   . Difficulty of Paying Living Expenses: Not on file  Food Insecurity:   . Worried About Charity fundraiser in the Last Year: Not on file  . Ran Out of Food in the Last Year: Not on file  Transportation Needs:   . Lack of Transportation (Medical): Not on file  . Lack of Transportation (Non-Medical): Not on file  Physical Activity:   . Days of Exercise per Week: Not on file  . Minutes of Exercise per Session: Not  on file  Stress:   . Feeling of Stress : Not on file  Social Connections:   . Frequency of Communication with Friends and Family: Not on file  . Frequency of Social Gatherings with Friends and Family: Not on file  . Attends Religious Services: Not on file  . Active Member of Clubs or Organizations: Not on file  . Attends Archivist Meetings: Not on file  . Marital Status: Not on file  Intimate Partner Violence:   . Fear of Current or Ex-Partner: Not on file  . Emotionally Abused: Not on file  . Physically Abused: Not on file  . Sexually Abused: Not on file   No current facility-administered medications on file prior to encounter.   Current Outpatient Medications on File Prior to Encounter  Medication Sig Dispense Refill  . acetaminophen (TYLENOL) 500 MG tablet Take 1,000 mg by mouth every 8 (eight) hours as needed for mild pain.    Marland Kitchen albuterol (PROVENTIL HFA;VENTOLIN HFA) 108 (90 BASE) MCG/ACT inhaler Inhale 1-2 puffs into the lungs every 6 (six) hours as needed for wheezing or shortness of breath. 1 Inhaler 0  . cetirizine (ZYRTEC) 10 MG tablet Take 10 mg by mouth daily.    . cyclobenzaprine (FLEXERIL) 10 MG tablet Take 0.5-1 tablets (  5-10 mg total) by mouth 2 (two) times daily as needed for muscle spasms. 20 tablet 0  . Ferrous Sulfate (IRON PO) Take by mouth.    . Prenatal Vit-Fe Phos-FA-Omega (VITAFOL GUMMIES) 3.33-0.333-34.8 MG CHEW Chew 3 tablets by mouth daily before breakfast. 90 tablet 11   No Known Allergies  ROS:  Review of Systems  Constitutional: Negative for fever.  Gastrointestinal: Negative for abdominal pain.  Genitourinary: Positive for vaginal discharge. Negative for vaginal bleeding.    I have reviewed patient's Past Medical Hx, Surgical Hx, Family Hx, Social Hx, medications and allergies.   Physical Exam   Patient Vitals for the past 24 hrs:  BP Temp src Pulse Resp SpO2 Weight  12/22/19 1558 (!) 147/97 Oral 86 18 100 % 119.4 kg   Physical  Exam  Nursing note and vitals reviewed. Constitutional: She is oriented to person, place, and time. She appears well-developed and well-nourished. No distress.  HENT:  Head: Normocephalic and atraumatic.  Cardiovascular: Normal rate.  Respiratory: Effort normal.  GI: Soft. She exhibits no distension.  Neurological: She is alert and oriented to person, place, and time.  Skin: Skin is warm and dry. No erythema.  Psychiatric: She has a normal mood and affect.   UPT - negative  MDM Patient denies any concerning symptoms in need of emergent evaluation. Patient advised that she may wait for a room in the [facility] or may choose to be discharged to seek non-emergent evaluation of her complaint at [facility] or Urgent care.    ASSESSMENT MSE Complete Negative pregnancy test   PLAN Discharge patient to seek non-emergent medical care elsewhere  Kathlene Cote 12/22/2019 4:29 PM

## 2019-12-22 NOTE — MAU Note (Signed)
Asked pt about BP, "it's up and down".  Was induced because of BP, but after was ok, so did not need meds.

## 2019-12-23 LAB — POCT PREGNANCY, URINE: Preg Test, Ur: NEGATIVE

## 2020-01-24 ENCOUNTER — Encounter: Payer: Self-pay | Admitting: *Deleted

## 2020-01-24 NOTE — Progress Notes (Signed)
Patient ID: Amanda Melton, female   DOB: Jan 22, 1986, 34 y.o.   MRN: [id number] ZIO patch monitor has not been returned to [employer] to be processed.   [employer] has attempted to contact patient three times to request return of monitor.

## 2020-06-12 ENCOUNTER — Other Ambulatory Visit: Payer: Self-pay

## 2020-06-12 ENCOUNTER — Ambulatory Visit
Admission: EM | Admit: 2020-06-12 | Discharge: 2020-06-12 | Disposition: A | Discharge status: Home / Self Care | Payer: Medicaid Other | Attending: Emergency Medicine | Admitting: Emergency Medicine

## 2020-06-12 ENCOUNTER — Encounter: Payer: Self-pay | Admitting: Emergency Medicine

## 2020-06-12 DIAGNOSIS — M5442 Lumbago with sciatica, left side: Secondary | ICD-10-CM

## 2020-06-12 MED ORDER — CYCLOBENZAPRINE HCL 5 MG PO TABS: 5.0000 mg | ORAL_TABLET | Freq: Two times a day (BID) | ORAL | 0 refills | Status: AC | PRN

## 2020-06-12 MED ORDER — NAPROXEN 500 MG PO TABS: 500.0000 mg | ORAL_TABLET | Freq: Two times a day (BID) | ORAL | 0 refills | Status: AC

## 2020-06-12 MED ORDER — METHYLPREDNISOLONE SODIUM SUCC 125 MG IJ SOLR
125.0000 mg | Freq: Once | INTRAMUSCULAR | Status: AC
  Administered 2020-06-12: 125 mg via INTRAMUSCULAR

## 2020-06-12 NOTE — ED Triage Notes (Signed)
Pt sts lower back pain with radiation down left leg x 2 weeks worse over last 3 days

## 2020-06-12 NOTE — Discharge Instructions (Addendum)

## 2020-06-12 NOTE — ED Provider Notes (Signed)
[facility]    CSN: [id number] Arrival date & time: 06/12/20  1453      History   Chief Complaint Chief Complaint  Patient presents with  . Back Pain    HPI Amanda Melton is a 34 y.o. female presents for low back pain x 2 wks, worse last 3 days.  States pain is achy, sore; worse w/ standing.  Has tried ibuprofen, lidocaine patches without relief.  Denies trauma/injury to the affected area.  Does perform lifting at work.  Denies fever, saddle area anesthesia, lower extremity numbness/weakness, urinary retention, fecal incontinence.   Past Medical History:  Diagnosis Date  . Asthma   . Chlamydia 2005  . Environmental allergies   . Hypertension    gestational hypertension    Patient Active Problem List   Diagnosis Date Noted  . SVD (11/12) 09/15/2019  . Pre-eclampsia 09/14/2019  . Obesity 12/09/2013    Past Surgical History:  Procedure Laterality Date  . NO PAST SURGERIES    . TUBAL LIGATION Bilateral 09/15/2019   Procedure: POST PARTUM TUBAL LIGATION;  Surgeon: Essie Hart, MD;  Location: [facility] LD ORS;  Service: Gynecology;  Laterality: Bilateral;    OB History    Gravida  5   Para  4   Term  3   Preterm  1   AB  1   Living  4     SAB  1   TAB  0   Ectopic  0   Multiple  0   Live Births  4            Home Medications    Prior to Admission medications   Medication Sig Start Date End Date Taking? Authorizing Provider  acetaminophen (TYLENOL) 500 MG tablet Take 1,000 mg by mouth every 8 (eight) hours as needed for mild pain.    [provider]  albuterol (PROVENTIL HFA;VENTOLIN HFA) 108 (90 BASE) MCG/ACT inhaler Inhale 1-2 puffs into the lungs every 6 (six) hours as needed for wheezing or shortness of breath. 03/11/15   Vanetta Mulders, MD  cetirizine (ZYRTEC) 10 MG tablet Take 10 mg by mouth daily.    [provider]  cyclobenzaprine (FLEXERIL) 5 MG tablet Take 1 tablet (5 mg total) by mouth 2 (two) times daily as  needed for up to 7 days for muscle spasms. 06/12/20 06/19/20  Hall-Potvin, [place], PA-C  Ferrous Sulfate (IRON PO) Take by mouth.    [provider]  naproxen (NAPROSYN) 500 MG tablet Take 1 tablet (500 mg total) by mouth 2 (two) times daily. 06/12/20   Hall-Potvin, [place], PA-C  Prenatal Vit-Fe Phos-FA-Omega (VITAFOL GUMMIES) 3.33-0.333-34.8 MG CHEW Chew 3 tablets by mouth daily before breakfast. 12/22/16   Brock Bad, MD    Family History Family History  Problem Relation Age of Onset  . Diabetes Father   . Hypertension Father   . Kidney failure Father   . Anesthesia problems Neg Hx     Social History Social History   Tobacco Use  . Smoking status: Never Smoker  . Smokeless tobacco: Never Used  Vaping Use  . Vaping Use: Never used  Substance Use Topics  . Alcohol use: Not Currently    Alcohol/week: 3.0 standard drinks    Types: 3 Standard drinks or equivalent per week    Comment: occasionally  . Drug use: No     Allergies   Patient has no known allergies.   Review of Systems As per HPI  Physical Exam Triage Vital Signs ED Triage Vitals [06/12/20 1508]  Enc Vitals Group     BP (!) 170/97     Pulse Rate 67     Resp 18     Temp 98.3 F (36.8 C)     Temp Source Oral     SpO2 97 %     Weight      Height      Head Circumference      Peak Flow      Pain Score 8     Pain Loc      Pain Edu?      Excl. in GC?    No data found.  Updated Vital Signs BP (!) 170/97 (BP Location: Left Arm)   Pulse 67   Temp 98.3 F (36.8 C) (Oral)   Resp 18   SpO2 97%   Visual Acuity Right Eye Distance:   Left Eye Distance:   Bilateral Distance:    Right Eye Near:   Left Eye Near:    Bilateral Near:     Physical Exam Constitutional:      General: She is not in acute distress. HENT:     Head: Normocephalic and atraumatic.  Eyes:     General: No scleral icterus.    Pupils: Pupils are equal, round, and reactive to light.  Cardiovascular:     Rate  and Rhythm: Normal rate.  Pulmonary:     Effort: Pulmonary effort is normal.  Musculoskeletal:        General: Tenderness present. No swelling or deformity.     Right lower leg: No edema.     Left lower leg: No edema.     Comments: Left lumbar TTP w/o mass, crepitus.  + SLR of L side.  NVI  Skin:    Coloration: Skin is not jaundiced or pale.  Neurological:     Mental Status: She is alert and oriented to person, place, and time.      UC Treatments / Results  Labs (all labs ordered are listed, but only abnormal results are displayed) Labs Reviewed - No data to display  EKG   Radiology No results found.  Procedures Procedures (including critical care time)  Medications Ordered in UC Medications  methylPREDNISolone sodium succinate (SOLU-MEDROL) 125 mg/2 mL injection 125 mg (125 mg Intramuscular Given 06/12/20 1552)    Initial Impression / Assessment and Plan / UC Course  I have reviewed the triage vital signs and the nursing notes.  Pertinent labs & imaging results that were available during my care of the patient were reviewed by me and considered in my medical decision making (see chart for details).     No fever or alarm symptoms as mentioned in HPI.  Will treat for sciatica as outlined below.  Return precautions discussed, pt verbalized understanding and is agreeable to plan. Final Clinical Impressions(s) / UC Diagnoses   Final diagnoses:  Acute low back pain with left-sided sciatica, unspecified back pain laterality     Discharge Instructions     RICE: rest, ice, compression, elevation as needed for pain.    Heat therapy (hot compress, warm wash rag, hot showers, etc.) can help relax muscles and soothe muscle aches. Cold therapy (ice packs) can be used to help swelling both after injury and after prolonged use of areas of chronic pain/aches.  Pain medication:  500 mg Naprosyn/Aleve (naproxen) every 12 hours with food:  AVOID other NSAIDs while taking this  (may have Tylenol).  May  take muscle relaxer as needed for severe pain / spasm.  (This medication may cause you to become tired so it is important you do not drink alcohol or operate heavy machinery while on this medication.  Recommend your first dose to be taken before bedtime to monitor for side effects safely)  Important to follow up with specialist(s) below for further evaluation/management if your symptoms persist or worsen.    ED Prescriptions    Medication Sig Dispense Auth. Provider   naproxen (NAPROSYN) 500 MG tablet Take 1 tablet (500 mg total) by mouth 2 (two) times daily. 30 tablet Hall-Potvin, [place], PA-C   cyclobenzaprine (FLEXERIL) 5 MG tablet Take 1 tablet (5 mg total) by mouth 2 (two) times daily as needed for up to 7 days for muscle spasms. 14 tablet Hall-Potvin, [place], PA-C     I have reviewed the PDMP during this encounter.   Hall-Potvin, [place], [place] 06/12/20 1554

## 2020-11-02 ENCOUNTER — Emergency Department (HOSPITAL_COMMUNITY)
Admission: EM | Admit: 2020-11-02 | Discharge: 2020-11-02 | Disposition: A | Discharge status: Home / Self Care | Payer: Commercial Managed Care - PPO | Attending: Emergency Medicine | Admitting: Emergency Medicine

## 2020-11-02 ENCOUNTER — Other Ambulatory Visit: Payer: Self-pay

## 2020-11-02 DIAGNOSIS — U071 COVID-19: Secondary | ICD-10-CM | POA: Insufficient documentation

## 2020-11-02 DIAGNOSIS — J45909 Unspecified asthma, uncomplicated: Secondary | ICD-10-CM | POA: Insufficient documentation

## 2020-11-02 DIAGNOSIS — J069 Acute upper respiratory infection, unspecified: Secondary | ICD-10-CM

## 2020-11-02 DIAGNOSIS — R059 Cough, unspecified: Secondary | ICD-10-CM | POA: Diagnosis present

## 2020-11-02 DIAGNOSIS — I10 Essential (primary) hypertension: Secondary | ICD-10-CM | POA: Insufficient documentation

## 2020-11-02 DIAGNOSIS — B9789 Other viral agents as the cause of diseases classified elsewhere: Secondary | ICD-10-CM | POA: Diagnosis not present

## 2020-11-02 LAB — POC SARS CORONAVIRUS 2 AG -  ED: SARS Coronavirus 2 Ag: NEGATIVE

## 2020-11-02 LAB — RESP PANEL BY RT-PCR (FLU A&B, COVID) ARPGX2
Influenza A by PCR: NEGATIVE
Influenza B by PCR: NEGATIVE
SARS Coronavirus 2 by RT PCR: POSITIVE — AB

## 2020-11-02 MED ORDER — IBUPROFEN 400 MG PO TABS
400.0000 mg | ORAL_TABLET | Freq: Once | ORAL | Status: AC
  Administered 2020-11-02: 400 mg via ORAL
  Filled 2020-11-02: qty 1

## 2020-11-02 MED ORDER — ACETAMINOPHEN 500 MG PO TABS
1000.0000 mg | ORAL_TABLET | Freq: Once | ORAL | Status: AC
  Administered 2020-11-02: 1000 mg via ORAL
  Filled 2020-11-02: qty 2

## 2020-11-02 NOTE — ED Provider Notes (Signed)
[facility] EMERGENCY DEPARTMENT Provider Note   CSN: [id number] Arrival date & time: 11/02/20  0630     History No chief complaint on file.   Amanda Melton is a 34 y.o. female.  Patient c/o fever, body aches, non prod cough, intermittent headache, mild congestion, in the past 1-2 days. Symptoms acute onset, moderate, persistent. Co-worker recent dx w covid. Denies chest pain or discomfort. No acute, abrupt, or severe headache. No neck pain or stiffness. No eye pain or change in vision. No photophobia. No sinus pain or purulent drainage. No sore throat or trouble swallowing. No abd pain or nvd. No gu c/o. No rash.   The history is provided by the patient.       Past Medical History:  Diagnosis Date  . Asthma   . Chlamydia 2005  . Environmental allergies   . Hypertension    gestational hypertension    Patient Active Problem List   Diagnosis Date Noted  . SVD (11/12) 09/15/2019  . Pre-eclampsia 09/14/2019  . Obesity 12/09/2013    Past Surgical History:  Procedure Laterality Date  . NO PAST SURGERIES    . TUBAL LIGATION Bilateral 09/15/2019   Procedure: POST PARTUM TUBAL LIGATION;  Surgeon: Sanjuana Kava, MD;  Location: [facility] LD ORS;  Service: Gynecology;  Laterality: Bilateral;     OB History    Gravida  5   Para  4   Term  3   Preterm  1   AB  1   Living  4     SAB  1   IAB  0   Ectopic  0   Multiple  0   Live Births  4           Family History  Problem Relation Age of Onset  . Diabetes Father   . Hypertension Father   . Kidney failure Father   . Anesthesia problems Neg Hx     Social History   Tobacco Use  . Smoking status: Never Smoker  . Smokeless tobacco: Never Used  Vaping Use  . Vaping Use: Never used  Substance Use Topics  . Alcohol use: Not Currently    Alcohol/week: 3.0 standard drinks    Types: 3 Standard drinks or equivalent per week    Comment: occasionally  . Drug use: No    Home  Medications Prior to Admission medications   Medication Sig Start Date End Date Taking? Authorizing Provider  acetaminophen (TYLENOL) 500 MG tablet Take 1,000 mg by mouth every 8 (eight) hours as needed for mild pain.    [provider]  albuterol (PROVENTIL HFA;VENTOLIN HFA) 108 (90 BASE) MCG/ACT inhaler Inhale 1-2 puffs into the lungs every 6 (six) hours as needed for wheezing or shortness of breath. 03/11/15   Fredia Sorrow, MD  cetirizine (ZYRTEC) 10 MG tablet Take 10 mg by mouth daily.    [provider]  Ferrous Sulfate (IRON PO) Take by mouth.    [provider]  naproxen (NAPROSYN) 500 MG tablet Take 1 tablet (500 mg total) by mouth 2 (two) times daily. 06/12/20   Hall-Potvin, [place], PA-C  Prenatal Vit-Fe Phos-FA-Omega (VITAFOL GUMMIES) 3.33-0.333-34.8 MG CHEW Chew 3 tablets by mouth daily before breakfast. 12/22/16   Shelly Bombard, MD    Allergies    Patient has no known allergies.  Review of Systems   Review of Systems  Constitutional: Positive for fever.  HENT: Positive for congestion. Negative for sore throat and trouble swallowing.  Eyes: Negative for pain and redness.  Respiratory: Positive for cough. Negative for shortness of breath.   Cardiovascular: Negative for chest pain.  Gastrointestinal: Negative for abdominal pain and vomiting.  Genitourinary: Negative for dysuria.  Musculoskeletal: Positive for myalgias. Negative for back pain, neck pain and neck stiffness.  Skin: Negative for rash.  Neurological: Negative for headaches.  Hematological: Does not bruise/bleed easily.  Psychiatric/Behavioral: Negative for confusion.    Physical Exam Updated Vital Signs BP (!) 120/58 (BP Location: Right Arm)   Pulse 77   Temp 98.9 F (37.2 C) (Oral)   Resp 16   Ht 1.651 m (5' 5")   Wt 104.3 kg   SpO2 96%   BMI 38.27 kg/m   Physical Exam Vitals and nursing note reviewed.  Constitutional:      Appearance: Normal appearance. She is  well-developed.     Comments: Febrile.   HENT:     Head: Atraumatic.     Comments: No sinus or temporal tenderness.     Nose: Nose normal.     Mouth/Throat:     Mouth: Mucous membranes are moist.     Pharynx: Oropharynx is clear. No oropharyngeal exudate or posterior oropharyngeal erythema.  Eyes:     General: No scleral icterus.    Conjunctiva/sclera: Conjunctivae normal.     Pupils: Pupils are equal, round, and reactive to light.  Neck:     Trachea: No tracheal deviation.     Comments: No stiffness or rigidity. Cardiovascular:     Rate and Rhythm: Normal rate and regular rhythm.     Pulses: Normal pulses.     Heart sounds: Normal heart sounds. No murmur heard. No friction rub. No gallop.   Pulmonary:     Effort: Pulmonary effort is normal. No respiratory distress.     Breath sounds: Normal breath sounds.  Abdominal:     General: Bowel sounds are normal. There is no distension.     Palpations: Abdomen is soft.     Tenderness: There is no abdominal tenderness.  Genitourinary:    Comments: No cva tenderness.  Musculoskeletal:        General: No swelling.     Cervical back: Normal range of motion and neck supple. No rigidity. No muscular tenderness.  Skin:    General: Skin is warm and dry.     Findings: No rash.  Neurological:     Mental Status: She is alert.     Comments: Alert, speech normal. Motor/sens grossly intact bil. Steady gait.  Psychiatric:        Mood and Affect: Mood normal.     ED Results / Procedures / Treatments   Labs (all labs ordered are listed, but only abnormal results are displayed) Results for orders placed or performed during the hospital encounter of 11/02/20  POC SARS Coronavirus 2 Ag-ED - Nasal Swab (BD Veritor Kit)  Result Value Ref Range   SARS Coronavirus 2 Ag NEGATIVE NEGATIVE    EKG None  Radiology No results found.  Procedures Procedures (including critical care time)  Medications Ordered in ED Medications  ibuprofen  (ADVIL) tablet 400 mg (has no administration in time range)  acetaminophen (TYLENOL) tablet 1,000 mg (1,000 mg Oral Given 11/02/20 0754)    ED Course  I have reviewed the triage vital signs and the nursing notes.  Pertinent labs & imaging results that were available during my care of the patient were reviewed by me and considered in my medical decision making (see chart for  details).    MDM Rules/Calculators/A&P                         From triage, covid antigen test sent - negative.   Reviewed nursing notes and prior charts for additional history.   Ibuprofen po. Po fluids.   Exam most c/w viral uri, possible flu, possible covid.  Flu/covid sent.   FOYE HAGGART was evaluated in Emergency Department on 11/02/2020 for the symptoms described in the history of present illness. She was evaluated in the context of the global COVID-19 pandemic, which necessitated consideration that the patient might be at risk for infection with the SARS-CoV-2 virus that causes COVID-19. Institutional protocols and algorithms that pertain to the evaluation of patients at risk for COVID-19 are in a state of rapid change based on information released by regulatory bodies including the CDC and federal and state organizations. These policies and algorithms were followed during the patient's care in the ED.  Overall, pt is not toxic, well appearing, breathing comfortably, with good o2 sats, and appears stable for d/c.   Return precautions provided.    Final Clinical Impression(s) / ED Diagnoses Final diagnoses:  None    Rx / DC Orders ED Discharge Orders    None       Lajean Saver, MD 11/02/20 1039

## 2020-11-02 NOTE — Discharge Instructions (Addendum)
It was our pleasure to provide your ER care today - we hope that you feel better.  Drink plenty of fluids/stay well hydrated, get adequate nutrition.   Take acetaminophen or ibuprofen as need.  The covid/flu tests takes 1-2 hours for result - you may check MyChart or call for results.   Return to ER if worse, new symptoms, increased trouble breathing, or other concern.

## 2020-11-02 NOTE — ED Notes (Signed)
C/o fever with generalized bodyaches onset this am, states she has had fever and chills.

## 2020-11-02 NOTE — ED Triage Notes (Signed)
Pt reports fever, chills, back pain, and headache. Has been around her boss who has covid.

## 2020-11-05 ENCOUNTER — Telehealth: Payer: Self-pay

## 2020-11-05 NOTE — Telephone Encounter (Signed)
Transition Care Management Follow-up Telephone Call  Date of discharge and from where: 11/02/2020 from [facility]  How have you been since you were released from the hospital? Patient states that she if feeling the same.   Any questions or concerns? No  Items Reviewed:  Did the pt receive and understand the discharge instructions provided? Yes   Medications obtained and verified? No   Other? No   Any new allergies since your discharge? No   Dietary orders reviewed? no  Do you have support at home? Yes    Functional Questionnaire: (I = Independent and D = Dependent) ADLs: I  Bathing/Dressing- I  Meal Prep- I  Eating- I  Maintaining continence- I  Transferring/Ambulation- I  Managing Meds- I   Follow up appointments reviewed:   PCP Hospital f/u appt confirmed? No    Specialist Hospital f/u appt confirmed? No    Are transportation arrangements needed? No   If their condition worsens, is the pt aware to call PCP or go to the Emergency Dept.? Yes  Was the patient provided with contact information for the PCP's office or ED? Yes  Was to pt encouraged to call back with questions or concerns? Yes

## 2021-11-25 ENCOUNTER — Other Ambulatory Visit: Payer: Self-pay

## 2021-11-25 ENCOUNTER — Encounter: Payer: Self-pay | Admitting: Emergency Medicine

## 2021-11-25 ENCOUNTER — Ambulatory Visit
Admission: EM | Admit: 2021-11-25 | Discharge: 2021-11-25 | Disposition: A | Discharge status: Other Acute Inpatient Hospital | Payer: Medicaid Other

## 2021-11-25 DIAGNOSIS — R1011 Right upper quadrant pain: Secondary | ICD-10-CM | POA: Diagnosis not present

## 2021-11-25 DIAGNOSIS — R110 Nausea: Secondary | ICD-10-CM

## 2021-11-25 NOTE — ED Triage Notes (Signed)
Noticed a lump to the RUQ yesterday before showering. Does lift heavy items at work regularly. States it's tender to touch. Reports mild nausea.

## 2021-11-25 NOTE — ED Provider Notes (Signed)
[facility]    CSN: [id number] Arrival date & time: 11/25/21  [id number]      History   Chief Complaint Chief Complaint  Patient presents with   Possible Hernia    HPI Amanda Melton is a 36 y.o. female.   Patient presents with a "lump" to the right upper quadrant that she noticed yesterday.  She also has associated pain to the area that is rated 7/10 on pain scale.  Nausea started yesterday without vomiting.  Patient is having normal bowel movements and denies blood in stool.  Denies chest pain, shortness of breath, upper respiratory symptoms, fever.  Patient reports that she does drink alcohol but not excessively.  Denies any injury to the area.    Past Medical History:  Diagnosis Date   Asthma    Chlamydia 2005   Environmental allergies    Hypertension    gestational hypertension    Patient Active Problem List   Diagnosis Date Noted   SVD (11/12) 09/15/2019   Pre-eclampsia 09/14/2019   Obesity 12/09/2013    Past Surgical History:  Procedure Laterality Date   NO PAST SURGERIES     TUBAL LIGATION Bilateral 09/15/2019   Procedure: POST PARTUM TUBAL LIGATION;  Surgeon: Sanjuana Kava, MD;  Location: [facility] LD ORS;  Service: Gynecology;  Laterality: Bilateral;    OB History     Gravida  5   Para  4   Term  3   Preterm  1   AB  1   Living  4      SAB  1   IAB  0   Ectopic  0   Multiple  0   Live Births  4            Home Medications    Prior to Admission medications   Medication Sig Start Date End Date Taking? Authorizing Provider  acetaminophen (TYLENOL) 500 MG tablet Take 1,000 mg by mouth every 8 (eight) hours as needed for mild pain.    [provider]  albuterol (PROVENTIL HFA;VENTOLIN HFA) 108 (90 BASE) MCG/ACT inhaler Inhale 1-2 puffs into the lungs every 6 (six) hours as needed for wheezing or shortness of breath. 03/11/15   Fredia Sorrow, MD  cetirizine (ZYRTEC) 10 MG tablet Take 10 mg by mouth daily.    [provider]  Ferrous Sulfate (IRON PO) Take by mouth.    [provider]  naproxen (NAPROSYN) 500 MG tablet Take 1 tablet (500 mg total) by mouth 2 (two) times daily. 06/12/20   Hall-Potvin, [place], PA-C  Prenatal Vit-Fe Phos-FA-Omega (VITAFOL GUMMIES) 3.33-0.333-34.8 MG CHEW Chew 3 tablets by mouth daily before breakfast. 12/22/16   Shelly Bombard, MD    Family History Family History  Problem Relation Age of Onset   Diabetes Father    Hypertension Father    Kidney failure Father    Anesthesia problems Neg Hx     Social History Social History   Tobacco Use   Smoking status: Never   Smokeless tobacco: Never  Vaping Use   Vaping Use: Never used  Substance Use Topics   Alcohol use: Not Currently    Alcohol/week: 3.0 standard drinks    Types: 3 Standard drinks or equivalent per week    Comment: occasionally   Drug use: No     Allergies   Patient has no known allergies.   Review of Systems Review of Systems Per HPI  Physical Exam Triage Vital Signs ED Triage Vitals  Enc Vitals Group     BP 11/25/21 1008 (!) 148/100     Pulse Rate 11/25/21 1008 63     Resp 11/25/21 1008 16     Temp 11/25/21 1008 98.1 F (36.7 C)     Temp Source 11/25/21 1008 Oral     SpO2 11/25/21 1008 98 %     Weight --      Height --      Head Circumference --      Peak Flow --      Pain Score 11/25/21 1009 7     Pain Loc --      Pain Edu? --      Excl. in [place]? --    No data found.  Updated Vital Signs BP (!) 148/100 (BP Location: Right Arm)    Pulse 63    Temp 98.1 F (36.7 C) (Oral)    Resp 16    SpO2 98%   Visual Acuity Right Eye Distance:   Left Eye Distance:   Bilateral Distance:    Right Eye Near:   Left Eye Near:    Bilateral Near:     Physical Exam Constitutional:      General: She is not in acute distress.    Appearance: Normal appearance. She is not toxic-appearing or diaphoretic.  HENT:     Head: Normocephalic and atraumatic.  Eyes:     Extraocular  Movements: Extraocular movements intact.     Conjunctiva/sclera: Conjunctivae normal.  Cardiovascular:     Rate and Rhythm: Normal rate and regular rhythm.     Pulses: Normal pulses.     Heart sounds: Normal heart sounds.  Pulmonary:     Effort: Pulmonary effort is normal. No respiratory distress.     Breath sounds: Normal breath sounds.  Abdominal:     General: Bowel sounds are normal. There is no distension.     Palpations: Abdomen is soft.     Tenderness: There is abdominal tenderness in the right upper quadrant.       Comments: Mild swelling and tenderness to palpation located to right upper quadrant.  No erythema or bruising noted.  Neurological:     General: No focal deficit present.     Mental Status: She is alert and oriented to person, place, and time. Mental status is at baseline.  Psychiatric:        Mood and Affect: Mood normal.        Behavior: Behavior normal.        Thought Content: Thought content normal.        Judgment: Judgment normal.     UC Treatments / Results  Labs (all labs ordered are listed, but only abnormal results are displayed) Labs Reviewed - No data to display  EKG   Radiology No results found.  Procedures Procedures (including critical care time)  Medications Ordered in UC Medications - No data to display  Initial Impression / Assessment and Plan / UC Course  I have reviewed the triage vital signs and the nursing notes.  Pertinent labs & imaging results that were available during my care of the patient were reviewed by me and considered in my medical decision making (see chart for details).     I do think the patient needs more extensive evaluation than can be provided at the urgent care today.  It appears the patient may need imaging of the abdomen such as ultrasound or CT to rule out worrisome etiologies as well as stat blood  work.  I am not able to order either one of these at urgent care.  Patient was advised to go to the  hospital.  Vital signs stable at discharge.  Agree with patient self transport to the hospital. Final Clinical Impressions(s) / UC Diagnoses   Final diagnoses:  Abdominal pain, right upper quadrant  Nausea without vomiting     Discharge Instructions      Please go to the hospital as soon as you leave urgent care for further evaluation and management.    ED Prescriptions   None    PDMP not reviewed this encounter.   Teodora Medici, [place] 11/25/21 1057

## 2021-11-25 NOTE — Discharge Instructions (Signed)
Please go to the hospital as soon as you leave urgent care for further evaluation and management. 

## 2022-12-02 DIAGNOSIS — R5383 Other fatigue: Secondary | ICD-10-CM | POA: Diagnosis not present

## 2022-12-02 DIAGNOSIS — N760 Acute vaginitis: Secondary | ICD-10-CM | POA: Diagnosis not present

## 2022-12-25 DIAGNOSIS — U071 COVID-19: Secondary | ICD-10-CM | POA: Diagnosis not present

## 2022-12-26 DIAGNOSIS — Z7251 High risk heterosexual behavior: Secondary | ICD-10-CM | POA: Diagnosis not present

## 2022-12-26 DIAGNOSIS — I10 Essential (primary) hypertension: Secondary | ICD-10-CM | POA: Diagnosis not present

## 2022-12-26 DIAGNOSIS — R300 Dysuria: Secondary | ICD-10-CM | POA: Diagnosis not present

## 2023-05-19 DIAGNOSIS — Z1329 Encounter for screening for other suspected endocrine disorder: Secondary | ICD-10-CM | POA: Diagnosis not present

## 2023-05-19 DIAGNOSIS — Z01419 Encounter for gynecological examination (general) (routine) without abnormal findings: Secondary | ICD-10-CM | POA: Diagnosis not present

## 2023-05-19 DIAGNOSIS — Z13228 Encounter for screening for other metabolic disorders: Secondary | ICD-10-CM | POA: Diagnosis not present

## 2023-05-19 DIAGNOSIS — Z1389 Encounter for screening for other disorder: Secondary | ICD-10-CM | POA: Diagnosis not present

## 2023-05-19 DIAGNOSIS — Z124 Encounter for screening for malignant neoplasm of cervix: Secondary | ICD-10-CM | POA: Diagnosis not present

## 2023-05-19 DIAGNOSIS — R102 Pelvic and perineal pain: Secondary | ICD-10-CM | POA: Diagnosis not present

## 2023-05-19 DIAGNOSIS — Z113 Encounter for screening for infections with a predominantly sexual mode of transmission: Secondary | ICD-10-CM | POA: Diagnosis not present

## 2023-05-19 DIAGNOSIS — Z13220 Encounter for screening for lipoid disorders: Secondary | ICD-10-CM | POA: Diagnosis not present

## 2023-05-19 DIAGNOSIS — R300 Dysuria: Secondary | ICD-10-CM | POA: Diagnosis not present

## 2023-06-15 DIAGNOSIS — R102 Pelvic and perineal pain: Secondary | ICD-10-CM | POA: Diagnosis not present

## 2023-06-15 DIAGNOSIS — Z2239 Carrier of other specified bacterial diseases: Secondary | ICD-10-CM | POA: Diagnosis not present

## 2023-06-15 DIAGNOSIS — N920 Excessive and frequent menstruation with regular cycle: Secondary | ICD-10-CM | POA: Diagnosis not present

## 2023-09-16 DIAGNOSIS — J069 Acute upper respiratory infection, unspecified: Secondary | ICD-10-CM | POA: Diagnosis not present

## 2023-09-16 DIAGNOSIS — L089 Local infection of the skin and subcutaneous tissue, unspecified: Secondary | ICD-10-CM | POA: Diagnosis not present

## 2023-10-08 DIAGNOSIS — N898 Other specified noninflammatory disorders of vagina: Secondary | ICD-10-CM | POA: Diagnosis not present

## 2023-10-08 DIAGNOSIS — N907 Vulvar cyst: Secondary | ICD-10-CM | POA: Diagnosis not present

## 2023-10-08 DIAGNOSIS — R82998 Other abnormal findings in urine: Secondary | ICD-10-CM | POA: Diagnosis not present
